# Patient Record
Sex: Male | Born: 1950
Health system: Southern US, Community
[De-identification: ages and names within clinical notes are randomized; demographics above are authoritative.]

## PROBLEM LIST (undated history)

## (undated) DIAGNOSIS — IMO0002 Reserved for concepts with insufficient information to code with codable children: Secondary | ICD-10-CM

## (undated) DIAGNOSIS — M1990 Unspecified osteoarthritis, unspecified site: Secondary | ICD-10-CM

## (undated) DIAGNOSIS — I714 Abdominal aortic aneurysm, without rupture, unspecified: Secondary | ICD-10-CM

## (undated) DIAGNOSIS — E079 Disorder of thyroid, unspecified: Secondary | ICD-10-CM

## (undated) DIAGNOSIS — E785 Hyperlipidemia, unspecified: Secondary | ICD-10-CM

## (undated) DIAGNOSIS — J449 Chronic obstructive pulmonary disease, unspecified: Secondary | ICD-10-CM

## (undated) DIAGNOSIS — B029 Zoster without complications: Secondary | ICD-10-CM

## (undated) DIAGNOSIS — H269 Unspecified cataract: Secondary | ICD-10-CM

## (undated) DIAGNOSIS — C801 Malignant (primary) neoplasm, unspecified: Secondary | ICD-10-CM

## (undated) DIAGNOSIS — F419 Anxiety disorder, unspecified: Secondary | ICD-10-CM

## (undated) DIAGNOSIS — I10 Essential (primary) hypertension: Secondary | ICD-10-CM

## (undated) HISTORY — PX: JOINT REPLACEMENT: SHX530

## (undated) HISTORY — DX: Unspecified cataract: H26.9

## (undated) HISTORY — DX: Abdominal aortic aneurysm, without rupture: I71.4

## (undated) HISTORY — DX: Hyperlipidemia, unspecified: E78.5

## (undated) HISTORY — DX: Essential (primary) hypertension: I10

## (undated) HISTORY — PX: TONSILLECTOMY: SUR1361

## (undated) HISTORY — DX: Anxiety disorder, unspecified: F41.9

## (undated) HISTORY — PX: EYE SURGERY: SHX253

## (undated) HISTORY — PX: CATARACT EXTRACTION: SUR2

## (undated) HISTORY — DX: Malignant (primary) neoplasm, unspecified: C80.1

## (undated) HISTORY — PX: OTHER SURGICAL HISTORY: SHX169

## (undated) HISTORY — DX: Abdominal aortic aneurysm, without rupture, unspecified: I71.40

## (undated) HISTORY — DX: Unspecified osteoarthritis, unspecified site: M19.90

## (undated) HISTORY — DX: Zoster without complications: B02.9

## (undated) HISTORY — DX: Disorder of thyroid, unspecified: E07.9

## (undated) HISTORY — DX: Chronic obstructive pulmonary disease, unspecified: J44.9

## (undated) HISTORY — DX: Reserved for concepts with insufficient information to code with codable children: IMO0002

## (undated) HISTORY — PX: COLONOSCOPY: SHX174

---

## 2001-01-01 ENCOUNTER — Encounter: Payer: Self-pay | Admitting: Internal Medicine

## 2001-01-01 ENCOUNTER — Encounter: Admission: RE | Admit: 2001-01-01 | Discharge: 2001-01-01 | Payer: Self-pay | Admitting: Internal Medicine

## 2002-03-12 ENCOUNTER — Encounter: Payer: Self-pay | Admitting: Physical Medicine and Rehabilitation

## 2002-03-12 ENCOUNTER — Ambulatory Visit (HOSPITAL_COMMUNITY)
Admission: RE | Admit: 2002-03-12 | Discharge: 2002-03-12 | Payer: Self-pay | Admitting: Physical Medicine and Rehabilitation

## 2002-05-30 ENCOUNTER — Encounter: Admission: RE | Admit: 2002-05-30 | Discharge: 2002-05-30 | Payer: Self-pay | Admitting: Internal Medicine

## 2002-05-30 ENCOUNTER — Encounter: Payer: Self-pay | Admitting: Internal Medicine

## 2008-07-28 ENCOUNTER — Emergency Department (HOSPITAL_COMMUNITY): Admission: EM | Admit: 2008-07-28 | Discharge: 2008-07-28 | Payer: Self-pay | Admitting: Emergency Medicine

## 2008-09-29 ENCOUNTER — Ambulatory Visit: Payer: Self-pay | Admitting: Internal Medicine

## 2010-07-27 LAB — DIFFERENTIAL
Basophils Absolute: 0.1 10*3/uL (ref 0.0–0.1)
Basophils Relative: 1 % (ref 0–1)
Monocytes Relative: 6 % (ref 3–12)
Neutro Abs: 4.3 10*3/uL (ref 1.7–7.7)
Neutrophils Relative %: 52 % (ref 43–77)

## 2010-07-27 LAB — CBC
HCT: 36.9 % — ABNORMAL LOW (ref 39.0–52.0)
Hemoglobin: 12.9 g/dL — ABNORMAL LOW (ref 13.0–17.0)
MCHC: 34.9 g/dL (ref 30.0–36.0)
MCV: 88.4 fL (ref 78.0–100.0)
Platelets: 259 K/uL (ref 150–400)
RBC: 4.17 MIL/uL — ABNORMAL LOW (ref 4.22–5.81)
RDW: 12.3 % (ref 11.5–15.5)
WBC: 8.2 K/uL (ref 4.0–10.5)

## 2010-07-27 LAB — HEPATIC FUNCTION PANEL
Bilirubin, Direct: <0.1 mg/dL (ref 0.0–0.3)
Total Bilirubin: 0.6 mg/dL (ref 0.3–1.2)

## 2010-07-27 LAB — POCT CARDIAC MARKERS
CKMB, poc: 1.2 ng/mL (ref 1.0–8.0)
Myoglobin, poc: 59.2 ng/mL (ref 12–200)
Myoglobin, poc: 59.9 ng/mL (ref 12–200)
Troponin i, poc: <0.05 ng/mL (ref 0.00–0.09)

## 2010-07-27 LAB — POCT I-STAT, CHEM 8
Chloride: 103 mEq/L (ref 96–112)
HCT: 37 % — ABNORMAL LOW (ref 39.0–52.0)
Potassium: 3.4 mEq/L — ABNORMAL LOW (ref 3.5–5.1)
Sodium: 140 mEq/L (ref 135–145)

## 2012-07-03 ENCOUNTER — Other Ambulatory Visit: Payer: Self-pay | Admitting: Family Medicine

## 2012-07-15 ENCOUNTER — Telehealth: Payer: Self-pay | Admitting: Family Medicine

## 2012-07-15 MED ORDER — CYCLOBENZAPRINE HCL 10 MG PO TABS: 10.0000 mg | ORAL_TABLET | Freq: Three times a day (TID) | ORAL | Status: DC | PRN

## 2012-07-15 NOTE — Telephone Encounter (Signed)
He can have 30 flexeril, i will e-scribe

## 2012-07-15 NOTE — Telephone Encounter (Signed)
Pt is requesting a refill on this and I see no record of this in his chart

## 2012-07-15 NOTE — Telephone Encounter (Signed)
Patient notified [facility]

## 2012-08-09 ENCOUNTER — Other Ambulatory Visit: Payer: Self-pay | Admitting: Family Medicine

## 2012-08-12 NOTE — Telephone Encounter (Signed)
?   OK to Refill  

## 2012-08-12 NOTE — Telephone Encounter (Signed)
Ok to refill 

## 2012-08-13 ENCOUNTER — Other Ambulatory Visit: Payer: Self-pay | Admitting: Family Medicine

## 2012-08-13 NOTE — Telephone Encounter (Signed)
Medication refilled per protocol. 

## 2012-09-10 ENCOUNTER — Other Ambulatory Visit: Payer: Self-pay | Admitting: Family Medicine

## 2012-09-11 NOTE — Telephone Encounter (Signed)
?   OK to Refill  

## 2012-09-11 NOTE — Telephone Encounter (Signed)
Ok to refill 

## 2012-10-21 ENCOUNTER — Telehealth: Payer: Self-pay | Admitting: Family Medicine

## 2012-10-21 NOTE — Telephone Encounter (Signed)
At beach for [place].  Got Shingles.  [facility] given treatment, famcyclovir 500 tid for 7 days.  On way home found imbedded tick in ear.  Told to call PCP may needs antibiotics??  Also wanted to know about working while still with wet blisters?  Is going to call his work place..they have their own med team for this advise.  Wanted to know if should treat tick bite??

## 2012-10-21 NOTE — Telephone Encounter (Signed)
I would only treat tick bite if he developed flu like symptoms or a spreading red ringed rash.  However, if he wants to err on the side of caution, he could have doxy 100 bid for 10 days.

## 2012-10-21 NOTE — Telephone Encounter (Signed)
Patient called back and explained to him per providers advise.  Has opted not to do antibiotics at this time and will call us if any symptoms mentioned develop

## 2012-10-25 ENCOUNTER — Other Ambulatory Visit: Payer: Self-pay | Admitting: Family Medicine

## 2012-10-25 ENCOUNTER — Other Ambulatory Visit: Payer: 59

## 2012-10-25 DIAGNOSIS — I10 Essential (primary) hypertension: Secondary | ICD-10-CM

## 2012-10-25 DIAGNOSIS — Z0000 Encounter for general adult medical examination without abnormal findings: Secondary | ICD-10-CM

## 2012-10-25 LAB — CBC WITH DIFFERENTIAL/PLATELET
Basophils Absolute: 0.1 10*3/uL (ref 0.0–0.1)
Basophils Relative: 1 % (ref 0–1)
Eosinophils Relative: 6 % — ABNORMAL HIGH (ref 0–5)
Lymphocytes Relative: 34 % (ref 12–46)
MCHC: 35.8 g/dL (ref 30.0–36.0)
MCV: 83.2 fL (ref 78.0–100.0)
Monocytes Absolute: 0.6 10*3/uL (ref 0.1–1.0)
Neutro Abs: 4.1 10*3/uL (ref 1.7–7.7)
Platelets: 297 10*3/uL (ref 150–400)
RDW: 13.5 % (ref 11.5–15.5)
WBC: 7.8 10*3/uL (ref 4.0–10.5)

## 2012-10-25 LAB — LIPID PANEL
LDL Cholesterol: 126 mg/dL — ABNORMAL HIGH (ref 0–99)
Total CHOL/HDL Ratio: 7 Ratio
VLDL: 48 mg/dL — ABNORMAL HIGH (ref 0–40)

## 2012-10-25 LAB — TSH: TSH: 5.365 u[IU]/mL — ABNORMAL HIGH (ref 0.350–4.500)

## 2012-11-05 ENCOUNTER — Encounter: Payer: Self-pay | Admitting: Family Medicine

## 2012-11-05 ENCOUNTER — Ambulatory Visit (INDEPENDENT_AMBULATORY_CARE_PROVIDER_SITE_OTHER): Payer: 59 | Admitting: Family Medicine

## 2012-11-05 VITALS — BP 120/74 | HR 74 | Temp 98.0°F | Resp 16 | Wt 193.0 lb

## 2012-11-05 DIAGNOSIS — I10 Essential (primary) hypertension: Secondary | ICD-10-CM | POA: Insufficient documentation

## 2012-11-05 DIAGNOSIS — Z1211 Encounter for screening for malignant neoplasm of colon: Secondary | ICD-10-CM

## 2012-11-05 DIAGNOSIS — E785 Hyperlipidemia, unspecified: Secondary | ICD-10-CM

## 2012-11-05 DIAGNOSIS — N529 Male erectile dysfunction, unspecified: Secondary | ICD-10-CM

## 2012-11-05 MED ORDER — TADALAFIL 20 MG PO TABS: 10.0000 mg | ORAL_TABLET | ORAL | Status: DC | PRN

## 2012-11-05 MED ORDER — ZOSTER VACCINE LIVE 19400 UNT/0.65ML ~~LOC~~ SOLR: 0.6500 mL | Freq: Once | SUBCUTANEOUS | Status: DC

## 2012-11-05 MED ORDER — PRAVASTATIN SODIUM 20 MG PO TABS: 20.0000 mg | ORAL_TABLET | Freq: Every day | ORAL | Status: DC

## 2012-11-05 NOTE — Progress Notes (Signed)
Subjective:    Patient ID: Keith Pratt, male    DOB: 06/08/1950, 62 y.o.   MRN: [id number]  HPI Patient recently had shingles around his abdomen. This was treated in urgent care. He is now requesting the shingles shot.  I sent the prescription to his pharmacy. He also has hypertension. He is currently on Avalide 300/12.5 one by mouth daily and Norvasc 10 mg by mouth daily. He denies chest pain, shortness of breath, dyspnea on exertion. He is complaining of erectile dysfunction. He states he has a difficult time achieving erections. When he is able to get an erection he quickly will fade. He is requesting medication to help the situation. He also has dyslipidemia. His most recent labs are listed below. He previously has tried Lipitor but stopped the medicine because it made him feel tired..  he also is overdue for colonoscopy.  Past Medical History  Diagnosis Date  . Hyperlipidemia   . Hypertension    Current Outpatient Prescriptions on File Prior to Visit  Medication Sig Dispense Refill  . amLODipine (NORVASC) 10 MG tablet TAKE 1 TABLET BY MOUTH DAILY AS NEEDED FOR BLOOD PRESSURE  30 tablet  5  . irbesartan-hydrochlorothiazide (AVALIDE) 300-12.5 MG per tablet TAKE 1 TABLET BY MOUTH DAILY IN THE MORNING AS NEEDED FOR BLOOD PRESSURE  30 tablet  5   No current facility-administered medications on file prior to visit.   Allergies  Allergen Reactions  . Codeine     REACTION: nauses and vomiting   History   Social History  . Marital Status: Divorced    Spouse Name: N/A    Number of Children: N/A  . Years of Education: N/A   Occupational History  . Not on file.   Social History Main Topics  . Smoking status: Former Games developer  . Smokeless tobacco: Not on file  . Alcohol Use: No  . Drug Use: No  . Sexually Active: Not on file   Other Topics Concern  . Not on file   Social History Narrative  . No narrative on file   Appointment on 10/25/2012  Component Date Value Range  Status  . Cholesterol 10/25/2012 203* 0 - 200 mg/dL Final   Comment: ATP III Classification:                                < 200        mg/dL        Desirable                               200 - 239     mg/dL        Borderline High                               >= 240        mg/dL        High                             . Triglycerides 10/25/2012 242* <150 mg/dL Final  . HDL 40/98/1191 29* >39 mg/dL Final  . Total CHOL/HDL Ratio 10/25/2012 7.0   Final  . VLDL 10/25/2012 48* 0 - 40 mg/dL Final  . LDL Cholesterol 10/25/2012 126* 0 - 99 mg/dL  Final   Comment:                            Total Cholesterol/HDL Ratio:CHD Risk                                                 Coronary Heart Disease Risk Table                                                                 Men       Women                                   1/2 Average Risk              3.4        3.3                                       Average Risk              5.0        4.4                                    2X Average Risk              9.6        7.1                                    3X Average Risk             23.4       11.0                          Use the calculated Patient Ratio above and the CHD Risk table                           to determine the patient's CHD Risk.                          ATP III Classification (LDL):                                < 100        mg/dL         Optimal                               100 - 129     mg/dL         Near or Above Optimal  130 - 159     mg/dL         Borderline High                               160 - 189     mg/dL         High                                > 190        mg/dL         Very High                             . PSA 10/25/2012 2.90  <=4.00 ng/mL Final   Comment: Test Methodology: ECLIA PSA (Electrochemiluminescence Immunoassay)                                                     For PSA values from 2.5-4.0, particularly in younger  men <60 years                          old, the AUA and NCCN suggest testing for % Free PSA (3515) and                          evaluation of the rate of increase in PSA (PSA velocity).  . TSH 10/25/2012 5.365* 0.350 - 4.500 uIU/mL Final  . WBC 10/25/2012 7.8  4.0 - 10.5 K/uL Final  . RBC 10/25/2012 4.64  4.22 - 5.81 MIL/uL Final  . Hemoglobin 10/25/2012 13.8  13.0 - 17.0 g/dL Final  . HCT 81/44/8185 38.6* 39.0 - 52.0 % Final  . MCV 10/25/2012 83.2  78.0 - 100.0 fL Final  . MCH 10/25/2012 29.7  26.0 - 34.0 pg Final  . MCHC 10/25/2012 35.8  30.0 - 36.0 g/dL Final  . RDW 63/14/9702 13.5  11.5 - 15.5 % Final  . Platelets 10/25/2012 297  150 - 400 K/uL Final  . Neutrophils Relative % 10/25/2012 52  43 - 77 % Final  . Neutro Abs 10/25/2012 4.1  1.7 - 7.7 K/uL Final  . Lymphocytes Relative 10/25/2012 34  12 - 46 % Final  . Lymphs Abs 10/25/2012 2.6  0.7 - 4.0 K/uL Final  . Monocytes Relative 10/25/2012 7  3 - 12 % Final  . Monocytes Absolute 10/25/2012 0.6  0.1 - 1.0 K/uL Final  . Eosinophils Relative 10/25/2012 6* 0 - 5 % Final  . Eosinophils Absolute 10/25/2012 0.4  0.0 - 0.7 K/uL Final  . Basophils Relative 10/25/2012 1  0 - 1 % Final  . Basophils Absolute 10/25/2012 0.1  0.0 - 0.1 K/uL Final  . Smear Review 10/25/2012 Criteria for review not met   Final      Review of Systems  All other systems reviewed and are negative.       Objective:   Physical Exam  Vitals reviewed. Constitutional: He is oriented to person, place, and time. He appears well-developed and well-nourished.  Neck: Neck supple. No thyromegaly present.  Cardiovascular: Normal rate, regular rhythm and normal heart sounds.  Exam reveals no gallop and no friction rub.   No murmur heard. Pulmonary/Chest: Effort normal and breath sounds normal. No respiratory distress. He has no wheezes. He has no rales. He exhibits no tenderness.  Abdominal: Soft. Bowel sounds are normal. He exhibits no distension and no mass. There  is no tenderness. There is no rebound and no guarding.  Lymphadenopathy:    He has no cervical adenopathy.  Neurological: He is alert and oriented to person, place, and time. No cranial nerve deficit. He exhibits normal muscle tone. Coordination normal.  Skin: Skin is warm. No rash noted. No erythema. No pallor.          Assessment & Plan:  1. HLD (hyperlipidemia) - pravastatin (PRAVACHOL) 20 MG tablet; Take 1 tablet (20 mg total) by mouth daily.  Dispense: 90 tablet; Refill: 3.  Patient's blood pressure is well controlled. Continue current medications at their present dosages.  2. Dyslipidemia Discussed patient's low HDL and elevated triglycerides. Recommend pravastatin 20 mg by mouth daily. Recheck fasting lipid panel in 3 months.  3. Erectile dysfunction Prescribed Cialis 20 mg tablets. He is to take 1/2-1 tablet by mouth daily when necessary for sexual activity. 4. Screen for colon cancer Refer to GI for colonoscopy. - Ambulatory referral to Gastroenterology

## 2012-11-14 ENCOUNTER — Ambulatory Visit (INDEPENDENT_AMBULATORY_CARE_PROVIDER_SITE_OTHER): Payer: 59 | Admitting: Family Medicine

## 2012-11-14 ENCOUNTER — Encounter: Payer: Self-pay | Admitting: Family Medicine

## 2012-11-14 VITALS — BP 110/68 | HR 64 | Temp 98.1°F | Resp 16 | Wt 194.0 lb

## 2012-11-14 DIAGNOSIS — L723 Sebaceous cyst: Secondary | ICD-10-CM

## 2012-11-14 NOTE — Progress Notes (Signed)
  Subjective:    Patient ID: Keith Pratt, male    DOB: 1950/12/12, 62 y.o.   MRN: [id number]  HPI  Patient has a 1 cm subcutaneous mass on the posterior neck. It has been there for years. He would like it excised. Past Medical History  Diagnosis Date  . Hyperlipidemia   . Hypertension    Current Outpatient Prescriptions on File Prior to Visit  Medication Sig Dispense Refill  . amLODipine (NORVASC) 10 MG tablet TAKE 1 TABLET BY MOUTH DAILY AS NEEDED FOR BLOOD PRESSURE  30 tablet  5  . irbesartan-hydrochlorothiazide (AVALIDE) 300-12.5 MG per tablet TAKE 1 TABLET BY MOUTH DAILY IN THE MORNING AS NEEDED FOR BLOOD PRESSURE  30 tablet  5  . pravastatin (PRAVACHOL) 20 MG tablet Take 1 tablet (20 mg total) by mouth daily.  90 tablet  3  . tadalafil (CIALIS) 20 MG tablet Take 0.5-1 tablets (10-20 mg total) by mouth every other day as needed for erectile dysfunction.  10 tablet  11  . vitamin B-12 (CYANOCOBALAMIN) 500 MCG tablet Take 500 mcg by mouth daily.       No current facility-administered medications on file prior to visit.   Allergies  Allergen Reactions  . Codeine     REACTION: nauses and vomiting   History   Social History  . Marital Status: Divorced    Spouse Name: N/A    Number of Children: N/A  . Years of Education: N/A   Occupational History  . Not on file.   Social History Main Topics  . Smoking status: Former Games developer  . Smokeless tobacco: Not on file  . Alcohol Use: No  . Drug Use: No  . Sexually Active: Not on file   Other Topics Concern  . Not on file   Social History Narrative  . No narrative on file     Review of Systems  All other systems reviewed and are negative.       Objective:   Physical Exam  Vitals reviewed. Cardiovascular: Normal rate and regular rhythm.   Pulmonary/Chest: Effort normal and breath sounds normal.   1 cm sebaceous cyst on the posterior neck.        Assessment & Plan:  1. Sebaceous cyst Informed consent was  obtained. The area was anesthetized with 0.1% lidocaine with epinephrine. It was then prepped and draped in sterile fashion. A 1.5 x 2 cm cutaneous excision was made revealing the underlying dermis. A 1 cm cyst sac was then isolated and dissected from the underlying subcutaneous fascia.  The subcutaneous fascia was then closed with 2 vertical mattress sutures of 3-0 Vicryl.  The skin was then closed with 5 simple interrupted sutures of 3-0 Ethilon.  Wound care was discussed. Blood loss was minimal. Sutures should be removed in 7-10 days

## 2012-11-21 ENCOUNTER — Ambulatory Visit (INDEPENDENT_AMBULATORY_CARE_PROVIDER_SITE_OTHER): Payer: 59 | Admitting: Family Medicine

## 2012-11-21 ENCOUNTER — Encounter: Payer: Self-pay | Admitting: Family Medicine

## 2012-11-21 VITALS — Temp 98.1°F | Wt 190.0 lb

## 2012-11-21 DIAGNOSIS — Z4802 Encounter for removal of sutures: Secondary | ICD-10-CM

## 2012-11-21 NOTE — Progress Notes (Signed)
  Subjective:    Patient ID: Keith Pratt, male    DOB: July 30, 1950, 62 y.o.   MRN: [id number]  HPI  11/14/12 Patient has a 1 cm subcutaneous mass on the posterior neck. It has been there for years. He would like it excised. 1. Sebaceous cyst Informed consent was obtained. The area was anesthetized with 0.1% lidocaine with epinephrine. It was then prepped and draped in sterile fashion. A 1.5 x 2 cm cutaneous excision was made revealing the underlying dermis. A 1 cm cyst sac was then isolated and dissected from the underlying subcutaneous fascia.  The subcutaneous fascia was then closed with 2 vertical mattress sutures of 3-0 Vicryl.  The skin was then closed with 5 simple interrupted sutures of 3-0 Ethilon.  Wound care was discussed. Blood loss was minimal. Sutures should be removed in 7-10 days  He is here today for suture removal. There are no complications. There is no evidence of cellulitis. He denies any pain or swelling. Past Medical History  Diagnosis Date  . Hyperlipidemia   . Hypertension    Current Outpatient Prescriptions on File Prior to Visit  Medication Sig Dispense Refill  . amLODipine (NORVASC) 10 MG tablet TAKE 1 TABLET BY MOUTH DAILY AS NEEDED FOR BLOOD PRESSURE  30 tablet  5  . irbesartan-hydrochlorothiazide (AVALIDE) 300-12.5 MG per tablet TAKE 1 TABLET BY MOUTH DAILY IN THE MORNING AS NEEDED FOR BLOOD PRESSURE  30 tablet  5  . pravastatin (PRAVACHOL) 20 MG tablet Take 1 tablet (20 mg total) by mouth daily.  90 tablet  3  . tadalafil (CIALIS) 20 MG tablet Take 0.5-1 tablets (10-20 mg total) by mouth every other day as needed for erectile dysfunction.  10 tablet  11  . vitamin B-12 (CYANOCOBALAMIN) 500 MCG tablet Take 500 mcg by mouth daily.       No current facility-administered medications on file prior to visit.   Allergies  Allergen Reactions  . Codeine     REACTION: nauses and vomiting   History   Social History  . Marital Status: Divorced    Spouse Name:  N/A    Number of Children: N/A  . Years of Education: N/A   Occupational History  . Not on file.   Social History Main Topics  . Smoking status: Former Games developer  . Smokeless tobacco: Not on file  . Alcohol Use: No  . Drug Use: No  . Sexually Active: Not on file   Other Topics Concern  . Not on file   Social History Narrative  . No narrative on file     Review of Systems  All other systems reviewed and are negative.       Objective:   Physical Exam  Vitals reviewed. Cardiovascular: Normal rate and regular rhythm.   Pulmonary/Chest: Effort normal and breath sounds normal.   well-healed incision on the posterior neck with 5 sutures. No evidence of cellulitis. There is no redness or swelling or pain       Assessment & Plan:  1. Visit for suture removal I removed 5 sutures without complication. The wound was then covered with 3 Steri-Strips. Followup as needed.

## 2013-01-07 ENCOUNTER — Ambulatory Visit (AMBULATORY_SURGERY_CENTER): Payer: Self-pay | Admitting: *Deleted

## 2013-01-07 VITALS — Ht 72.0 in | Wt 193.0 lb

## 2013-01-07 DIAGNOSIS — Z1211 Encounter for screening for malignant neoplasm of colon: Secondary | ICD-10-CM

## 2013-01-07 MED ORDER — PEG-KCL-NACL-NASULF-NA ASC-C 100 G PO SOLR: ORAL | Status: DC

## 2013-01-07 NOTE — Progress Notes (Signed)
No soy or egg allergy 

## 2013-01-08 ENCOUNTER — Encounter: Payer: Self-pay | Admitting: Internal Medicine

## 2013-01-21 ENCOUNTER — Encounter: Payer: Self-pay | Admitting: Internal Medicine

## 2013-01-21 ENCOUNTER — Ambulatory Visit (AMBULATORY_SURGERY_CENTER): Payer: 59 | Admitting: Internal Medicine

## 2013-01-21 VITALS — BP 103/68 | HR 64 | Temp 97.5°F | Resp 18 | Ht 72.0 in | Wt 193.0 lb

## 2013-01-21 DIAGNOSIS — Z1211 Encounter for screening for malignant neoplasm of colon: Secondary | ICD-10-CM

## 2013-01-21 MED ORDER — SODIUM CHLORIDE 0.9 % IV SOLN: 500.0000 mL | INTRAVENOUS | Status: DC

## 2013-01-21 NOTE — Patient Instructions (Addendum)
YOU HAD AN ENDOSCOPIC PROCEDURE TODAY AT THE [facility]: Refer to the procedure report that was given to you for any specific questions about what was found during the examination.  If the procedure report does not answer your questions, please call your gastroenterologist to clarify.  If you requested that your care partner not be given the details of your procedure findings, then the procedure report has been included in a sealed envelope for you to review at your convenience later.  YOU SHOULD EXPECT: Some feelings of bloating in the abdomen. Passage of more gas than usual.  Walking can help get rid of the air that was put into your GI tract during the procedure and reduce the bloating. If you had a lower endoscopy (such as a colonoscopy or flexible sigmoidoscopy) you may notice spotting of blood in your stool or on the toilet paper. If you underwent a bowel prep for your procedure, then you may not have a normal bowel movement for a few days.  DIET: Your first meal following the procedure should be a light meal and then it is ok to progress to your normal diet.  A half-sandwich or bowl of soup is an example of a good first meal.  Heavy or fried foods are harder to digest and may make you feel nauseous or bloated.  Likewise meals heavy in dairy and vegetables can cause extra gas to form and this can also increase the bloating.  Drink plenty of fluids but you should avoid alcoholic beverages for 24 hours.  ACTIVITY: Your care partner should take you home directly after the procedure.  You should plan to take it easy, moving slowly for the rest of the day.  You can resume normal activity the day after the procedure however you should NOT DRIVE or use heavy machinery for 24 hours (because of the sedation medicines used during the test).    SYMPTOMS TO REPORT IMMEDIATELY: A gastroenterologist can be reached at any hour.  During normal business hours, 8:30 AM to 5:00 PM Monday through Friday,  call [phone].  After hours and on weekends, please call the GI answering service at [phone] who will take a message and have the physician on call contact you.   Following lower endoscopy (colonoscopy or flexible sigmoidoscopy):  Excessive amounts of blood in the stool  Significant tenderness or worsening of abdominal pains  Swelling of the abdomen that is new, acute  Fever of 100F or higher  FOLLOW UP:  Our staff will call the home number listed on your records the next business day following your procedure to check on you and address any questions or concerns that you may have at that time regarding the information given to you following your procedure. This is a courtesy call and so if there is no answer at the home number and we have not heard from you through the emergency physician on call, we will assume that you have returned to your regular daily activities without incident.  SIGNATURES/CONFIDENTIALITY: You and/or your care partner have signed paperwork which will be entered into your electronic medical record.  These signatures attest to the fact that that the information above on your After Visit Summary has been reviewed and is understood.  Full responsibility of the confidentiality of this discharge information lies with you and/or your care-partner.  Please continue your normal medicines  Follow up colonoscopy in 10 years

## 2013-01-21 NOTE — Op Note (Signed)
[facility] [address] [place], [address]   COLONOSCOPY PROCEDURE REPORT  PATIENT: Keith, Pratt  MR#: [id number] BIRTHDATE: Oct 09, 1950 , 62  yrs. old GENDER: Male ENDOSCOPIST: Roxy Cedar, MD REFERRED [id number] Recall PROCEDURE DATE:  01/21/2013 PROCEDURE:   Colonoscopy, screening First Screening Colonoscopy - Avg.  risk and is 50 yrs.  old or older - No.  Prior Negative Screening - Now for repeat screening. 10 or more years since last screening  History of Adenoma - Now for follow-up colonoscopy & has been > or = to 3 yrs.  N/A  Polyps Removed Today? No.  Recommend repeat exam, <10 yrs? No. ASA CLASS:   Class II INDICATIONS:average risk screening.   Index 03-2001 (hpp only) MEDICATIONS: MAC sedation, administered by CRNA and propofol (Diprivan) 200mg  IV  DESCRIPTION OF PROCEDURE:   After the risks benefits and alternatives of the procedure were thoroughly explained, informed consent was obtained.  A digital rectal exam revealed no abnormalities of the rectum.   The LB [id number]  endoscope was introduced through the anus and advanced to the cecum, which was identified by both the appendix and ileocecal valve. No adverse events experienced.   The quality of the prep was excellent, using MoviPrep  The instrument was then slowly withdrawn as the colon was fully examined.    COLON FINDINGS: A normal appearing cecum, ileocecal valve, and appendiceal orifice were identified.  The ascending, hepatic flexure, transverse, splenic flexure, descending, sigmoid colon and rectum appeared unremarkable.  No polyps or cancers were seen. Retroflexed views revealed no abnormalities. The time to cecum=3 minutes 02 seconds.  Withdrawal time=9 minutes 45 seconds.  The scope was withdrawn and the procedure completed.  COMPLICATIONS: There were no complications.  ENDOSCOPIC IMPRESSION: 1. Normal colon  RECOMMENDATIONS: 1. Continue current colorectal  screening recommendations for "routine risk" patients with a repeat colonoscopy in 10 years.   eSigned:  Roxy Cedar, MD 01/21/2013 10:28 AM   cc: Georges Mouse, MD and The Patient   PATIENT NAME:  Keith, Pratt MR#: [id number]

## 2013-01-21 NOTE — Progress Notes (Signed)
Lidocaine-40mg IV prior to Propofol InductionPropofol given over incremental dosages 

## 2013-01-21 NOTE — Progress Notes (Signed)
Patient did not experience any of the following events: a burn prior to discharge; a fall within the facility; wrong site/side/patient/procedure/implant event; or a hospital transfer or hospital admission upon discharge from the facility. (G8907) Patient did not have preoperative order for IV antibiotic SSI prophylaxis. (G8918)  

## 2013-01-22 ENCOUNTER — Telehealth: Payer: Self-pay | Admitting: *Deleted

## 2013-01-22 NOTE — Telephone Encounter (Signed)
  Follow up Call-  Call back number 01/21/2013  Post procedure Call Back phone  # [phone]  Permission to leave phone message Yes     Patient questions:  Do you have a fever, pain , or abdominal swelling? no Pain Score  0 *  Have you tolerated food without any problems? yes  Have you been able to return to your normal activities? yes  Do you have any questions about your discharge instructions: Diet   no Medications  no Follow up visit  no  Do you have questions or concerns about your Care? no  Actions: * If pain score is 4 or above: No action needed, pain <4.

## 2013-01-30 ENCOUNTER — Encounter: Payer: Self-pay | Admitting: Family Medicine

## 2013-01-30 ENCOUNTER — Ambulatory Visit (INDEPENDENT_AMBULATORY_CARE_PROVIDER_SITE_OTHER): Payer: 59 | Admitting: Family Medicine

## 2013-01-30 VITALS — BP 110/70 | HR 68 | Temp 97.6°F | Resp 18 | Wt 194.0 lb

## 2013-01-30 DIAGNOSIS — J019 Acute sinusitis, unspecified: Secondary | ICD-10-CM

## 2013-01-30 MED ORDER — FLUTICASONE PROPIONATE 50 MCG/ACT NA SUSP: 2.0000 | Freq: Every day | NASAL | Status: DC

## 2013-01-30 MED ORDER — AMOXICILLIN-POT CLAVULANATE 875-125 MG PO TABS: 1.0000 | ORAL_TABLET | Freq: Two times a day (BID) | ORAL | Status: DC

## 2013-01-30 NOTE — Progress Notes (Signed)
  Subjective:    Patient ID: Keith Pratt, male    DOB: 1951-03-22, 62 y.o.   MRN: [id number]  HPI  Patient is a 62 y/o with 1 week of sinus pressure, pain, and congestion with post nasal drip, productive cough , fever, and sinus headache. Past Medical History  Diagnosis Date  . Hyperlipidemia   . Hypertension   . Arthritis   . Cancer     basal cell CA-under left arm  . Ulcer   . Thyroid disease     as a child- no problem as adult  . Shingles     July 2014   Current Outpatient Prescriptions on File Prior to Visit  Medication Sig Dispense Refill  . amLODipine (NORVASC) 10 MG tablet TAKE 1 TABLET BY MOUTH DAILY AS NEEDED FOR BLOOD PRESSURE  30 tablet  5  . irbesartan-hydrochlorothiazide (AVALIDE) 300-12.5 MG per tablet TAKE 1 TABLET BY MOUTH DAILY IN THE MORNING AS NEEDED FOR BLOOD PRESSURE  30 tablet  5  . pravastatin (PRAVACHOL) 20 MG tablet Take 1 tablet (20 mg total) by mouth daily.  90 tablet  3  . tadalafil (CIALIS) 20 MG tablet Take 0.5-1 tablets (10-20 mg total) by mouth every other day as needed for erectile dysfunction.  10 tablet  11   No current facility-administered medications on file prior to visit.   Allergies  Allergen Reactions  . Codeine     REACTION: nauses and vomiting   History   Social History  . Marital Status: Divorced    Spouse Name: N/A    Number of Children: N/A  . Years of Education: N/A   Occupational History  . Not on file.   Social History Main Topics  . Smoking status: Former Smoker    Quit date: 01/08/2003  . Smokeless tobacco: Never Used  . Alcohol Use: No  . Drug Use: No  . Sexual Activity: Not on file   Other Topics Concern  . Not on file   Social History Narrative  . No narrative on file      Review of Systems  All other systems reviewed and are negative.       Objective:   Physical Exam  Vitals reviewed. HENT:  Right Ear: External ear normal.  Left Ear: External ear normal.  Nose: Mucosal edema,  rhinorrhea and sinus tenderness present. Right sinus exhibits maxillary sinus tenderness and frontal sinus tenderness. Left sinus exhibits maxillary sinus tenderness and frontal sinus tenderness.  Mouth/Throat: Oropharynx is clear and moist. No oropharyngeal exudate.  Eyes: Conjunctivae are normal. Pupils are equal, round, and reactive to light.  Neck: Neck supple. No thyromegaly present.  Cardiovascular: Normal rate, regular rhythm and normal heart sounds.   No murmur heard. Pulmonary/Chest: Effort normal and breath sounds normal. No respiratory distress. He has no wheezes. He has no rales.  Lymphadenopathy:    He has no cervical adenopathy.          Assessment & Plan:  1. Acute rhinosinusitis  - amoxicillin-clavulanate (AUGMENTIN) 875-125 MG per tablet; Take 1 tablet by mouth 2 (two) times daily.  Dispense: 20 tablet; Refill: 0 - fluticasone (FLONASE) 50 MCG/ACT nasal spray; Place 2 sprays into the nose daily.  Dispense: 16 g; Refill: 6

## 2013-02-11 ENCOUNTER — Other Ambulatory Visit: Payer: Self-pay | Admitting: Family Medicine

## 2013-02-11 NOTE — Telephone Encounter (Signed)
Medication refilled per protocol. 

## 2013-04-07 ENCOUNTER — Encounter: Payer: Self-pay | Admitting: Family Medicine

## 2013-04-07 ENCOUNTER — Ambulatory Visit (INDEPENDENT_AMBULATORY_CARE_PROVIDER_SITE_OTHER): Payer: 59 | Admitting: Family Medicine

## 2013-04-07 VITALS — BP 120/72 | HR 68 | Temp 97.0°F | Resp 18 | Ht 74.0 in | Wt 200.0 lb

## 2013-04-07 DIAGNOSIS — J019 Acute sinusitis, unspecified: Secondary | ICD-10-CM

## 2013-04-07 DIAGNOSIS — B9689 Other specified bacterial agents as the cause of diseases classified elsewhere: Secondary | ICD-10-CM

## 2013-04-07 MED ORDER — AZITHROMYCIN 250 MG PO TABS: ORAL_TABLET | ORAL | Status: DC

## 2013-04-07 NOTE — Progress Notes (Signed)
Subjective:    Patient ID: Keith Pratt, male    DOB: [birth year], 62 y.o.   MRN: [id number]  HPI Patient presents today with one month of constant sinus pressure in his maxillary and frontal sinuses. They will not drain. He reports a diffuse headache. Ears feel stopped up due to eustachian tube dysfunction. He is constantly congested with postnasal drip. His upper jaw aches.  He had similar symptoms last year treated effectively with a Z-Pak. Past Medical History  Diagnosis Date  . Hyperlipidemia   . Hypertension   . Arthritis   . Cancer     basal cell CA-under left arm  . Ulcer   . Thyroid disease     as a child- no problem as adult  . Shingles     July 2014   Current Outpatient Prescriptions on File Prior to Visit  Medication Sig Dispense Refill  . amLODipine (NORVASC) 10 MG tablet TAKE 1 TABLET BY MOUTH DAILY AS NEEDED FOR BLOOD PRESSURE  30 tablet  3  . fluticasone (FLONASE) 50 MCG/ACT nasal spray Place 2 sprays into the nose daily.  16 g  6  . irbesartan-hydrochlorothiazide (AVALIDE) 300-12.5 MG per tablet TAKE 1 TABLET BY MOUTH DAILY IN THE MORNING AS NEEDED FOR BLOOD PRESSURE  30 tablet  3  . pravastatin (PRAVACHOL) 20 MG tablet Take 1 tablet (20 mg total) by mouth daily.  90 tablet  3  . tadalafil (CIALIS) 20 MG tablet Take 0.5-1 tablets (10-20 mg total) by mouth every other day as needed for erectile dysfunction.  10 tablet  11   No current facility-administered medications on file prior to visit.   Allergies  Allergen Reactions  . Codeine     REACTION: nauses and vomiting   History   Social History  . Marital Status: Divorced    Spouse Name: N/A    Number of Children: N/A  . Years of Education: N/A   Occupational History  . Not on file.   Social History Main Topics  . Smoking status: Former Smoker    Quit date: 01/08/2003  . Smokeless tobacco: Never Used  . Alcohol Use: No  . Drug Use: No  . Sexual Activity: Not on file   Other Topics Concern  .  Not on file   Social History Narrative  . No narrative on file      Review of Systems  All other systems reviewed and are negative.       Objective:   Physical Exam  Vitals reviewed. Constitutional: He appears well-developed and well-nourished. No distress.  HENT:  Right Ear: Tympanic membrane, external ear and ear canal normal.  Left Ear: Tympanic membrane, external ear and ear canal normal.  Nose: Mucosal edema and rhinorrhea present. Right sinus exhibits maxillary sinus tenderness and frontal sinus tenderness. Left sinus exhibits maxillary sinus tenderness and frontal sinus tenderness.  Mouth/Throat: Oropharynx is clear and moist. No oropharyngeal exudate.  Neck: Neck supple. No JVD present.  Cardiovascular: Normal rate, regular rhythm and normal heart sounds.  Exam reveals no gallop and no friction rub.   No murmur heard. Pulmonary/Chest: Effort normal and breath sounds normal. No respiratory distress. He has no wheezes. He has no rales.  Lymphadenopathy:    He has no cervical adenopathy.  Skin: He is not diaphoretic.          Assessment & Plan:  1. Acute bacterial rhinosinusitis I. the patient a Z-Pak. Also recommended Sudafed 60 mg by mouth every 6 hours  as needed for congestion. Recommended Mucinex for her milligrams by mouth every 6 hours as needed as a mucolytic. Recommended nasal saline 4 times a day. - azithromycin (ZITHROMAX) 250 MG tablet; 2 tabs poqday 1, 1 tab poqday 2-5  Dispense: 6 tablet; Refill: 0

## 2013-06-10 ENCOUNTER — Encounter: Payer: Self-pay | Admitting: Family Medicine

## 2013-06-10 ENCOUNTER — Ambulatory Visit (INDEPENDENT_AMBULATORY_CARE_PROVIDER_SITE_OTHER): Payer: 59 | Admitting: Family Medicine

## 2013-06-10 VITALS — BP 120/64 | HR 86 | Temp 97.1°F | Resp 18 | Ht 74.0 in | Wt 204.0 lb

## 2013-06-10 DIAGNOSIS — K649 Unspecified hemorrhoids: Secondary | ICD-10-CM

## 2013-06-10 DIAGNOSIS — L820 Inflamed seborrheic keratosis: Secondary | ICD-10-CM

## 2013-06-10 MED ORDER — HYDROCORTISONE 2.5 % RE CREA: 1.0000 "application " | TOPICAL_CREAM | Freq: Two times a day (BID) | RECTAL | Status: DC

## 2013-06-10 MED ORDER — MOMETASONE FUROATE 0.1 % EX OINT: TOPICAL_OINTMENT | Freq: Every day | CUTANEOUS | Status: DC

## 2013-06-10 NOTE — Progress Notes (Signed)
Subjective:    Patient ID: Keith Pratt, male    DOB: 12-23-1950, 63 y.o.   MRN: [id number]  HPI Patient is here today for 2 concerns. Problem #1, he has a 7 mm talotibial keratoses on the left side of his nose/left cheek. It is extremely irritating and he would like this removed. Problem #2, he has irritated external hemorrhoids. He has tried Preparation H over-the-counter without benefit. He denies any bleeding. He denies any pain with defecation. He denies any constipation. He denies any melena or hematochezia.  They have been there for approximately one week.   Past Medical History  Diagnosis Date  . Hyperlipidemia   . Hypertension   . Arthritis   . Cancer     basal cell CA-under left arm  . Ulcer   . Thyroid disease     as a child- no problem as adult  . Shingles     July 2014   Current Outpatient Prescriptions on File Prior to Visit  Medication Sig Dispense Refill  . irbesartan-hydrochlorothiazide (AVALIDE) 300-12.5 MG per tablet TAKE 1 TABLET BY MOUTH DAILY IN THE MORNING AS NEEDED FOR BLOOD PRESSURE  30 tablet  3  . pravastatin (PRAVACHOL) 20 MG tablet Take 1 tablet (20 mg total) by mouth daily.  90 tablet  3  . tadalafil (CIALIS) 20 MG tablet Take 0.5-1 tablets (10-20 mg total) by mouth every other day as needed for erectile dysfunction.  10 tablet  11  . amLODipine (NORVASC) 10 MG tablet TAKE 1 TABLET BY MOUTH DAILY AS NEEDED FOR BLOOD PRESSURE  30 tablet  3   No current facility-administered medications on file prior to visit.   Allergies  Allergen Reactions  . Codeine     REACTION: nauses and vomiting   History   Social History  . Marital Status: Divorced    Spouse Name: N/A    Number of Children: N/A  . Years of Education: N/A   Occupational History  . Not on file.   Social History Main Topics  . Smoking status: Former Smoker    Quit date: 01/08/2003  . Smokeless tobacco: Never Used  . Alcohol Use: No  . Drug Use: No  . Sexual Activity: Not on  file   Other Topics Concern  . Not on file   Social History Narrative  . No narrative on file      Review of Systems  All other systems reviewed and are negative.       Objective:   Physical Exam  Vitals reviewed. Constitutional: He appears well-developed and well-nourished.  Cardiovascular: Normal rate and regular rhythm.   Pulmonary/Chest: Effort normal and breath sounds normal.   external hemorrhoid at 11:00 with the patient in a standing position. 4 millimeter irritated seborrheic keratoses on his left cheek.        Assessment & Plan:  1. Hemorrhoids Anusol-HC cream applied twice a day for 10 days. The patient is to call me back if symptoms are no better after 10 days. - hydrocortisone (ANUSOL-HC) 2.5 % rectal cream; Place 1 application rectally 2 (two) times daily.  Dispense: 30 g; Refill: 0  I also gave patient prescription for Elocon ointment to be applied once daily to 8 eczema-like rash on the dorsum of his left foot.  2. SK- the patient requests excision. I warned the patient that this may leave a small scar. I anesthetized the lesion was 0.1% lidocaine with epinephrine. I performed a shave biopsy of the lesion. Hemostasis  was achieved with Drysol. The patient tolerated the procedure well with no complications.

## 2013-06-20 ENCOUNTER — Encounter: Payer: Self-pay | Admitting: Family Medicine

## 2013-06-20 ENCOUNTER — Telehealth: Payer: Self-pay | Admitting: Family Medicine

## 2013-06-20 MED ORDER — IRBESARTAN-HYDROCHLOROTHIAZIDE 300-12.5 MG PO TABS: 1.0000 | ORAL_TABLET | Freq: Every day | ORAL | Status: DC

## 2013-06-20 MED ORDER — AMLODIPINE BESYLATE 10 MG PO TABS: 10.0000 mg | ORAL_TABLET | Freq: Every day | ORAL | Status: DC

## 2013-06-20 NOTE — Telephone Encounter (Signed)
Medication refill for one time only.  Patient needs to be seen.  Letter sent for patient to call and schedule 

## 2013-06-24 ENCOUNTER — Other Ambulatory Visit: Payer: Self-pay | Admitting: Family Medicine

## 2013-06-24 MED ORDER — AMLODIPINE BESYLATE 10 MG PO TABS: 10.0000 mg | ORAL_TABLET | Freq: Every day | ORAL | Status: DC

## 2013-06-24 MED ORDER — IRBESARTAN-HYDROCHLOROTHIAZIDE 300-12.5 MG PO TABS: 1.0000 | ORAL_TABLET | Freq: Every day | ORAL | Status: DC

## 2013-06-24 NOTE — Telephone Encounter (Signed)
Rx Refilled  

## 2013-07-14 ENCOUNTER — Ambulatory Visit (INDEPENDENT_AMBULATORY_CARE_PROVIDER_SITE_OTHER): Payer: 59 | Admitting: Family Medicine

## 2013-07-14 ENCOUNTER — Encounter: Payer: Self-pay | Admitting: Family Medicine

## 2013-07-14 VITALS — BP 108/62 | HR 68 | Temp 97.4°F | Resp 18 | Ht 74.0 in | Wt 195.0 lb

## 2013-07-14 DIAGNOSIS — E785 Hyperlipidemia, unspecified: Secondary | ICD-10-CM

## 2013-07-14 DIAGNOSIS — J310 Chronic rhinitis: Secondary | ICD-10-CM

## 2013-07-14 DIAGNOSIS — I10 Essential (primary) hypertension: Secondary | ICD-10-CM

## 2013-07-14 DIAGNOSIS — J329 Chronic sinusitis, unspecified: Secondary | ICD-10-CM

## 2013-07-14 MED ORDER — CEFDINIR 300 MG PO CAPS: 300.0000 mg | ORAL_CAPSULE | Freq: Two times a day (BID) | ORAL | Status: DC

## 2013-07-14 MED ORDER — PREDNISONE 20 MG PO TABS: ORAL_TABLET | ORAL | Status: DC

## 2013-07-14 NOTE — Progress Notes (Signed)
Subjective:    Patient ID: Keith Pratt, male    DOB: 1950/10/06, 63 y.o.   MRN: [id number]  HPI Patient is here today for hypertension.  Patient started taking Norvasc 10 mg by mouth daily and Avalide 300/12.5 one by mouth daily. He denies any chest pain shortness of breath or dyspnea on exertion. He is also taking pravastatin 20 mg by mouth daily. He denies any mouth or right quadrant pain. He returns fasting for a fasting lipid panel. He is a chronic sinus trouble for the last several months. He is tried Flonase, nasal saline, and Sudafed without relief. He's even tried Z-Pak without relief. He reports pressure and constant postnasal drip. Past Medical History  Diagnosis Date  . Hyperlipidemia   . Hypertension   . Arthritis   . Cancer     basal cell CA-under left arm  . Ulcer   . Thyroid disease     as a child- no problem as adult  . Shingles     July 2014   Current Outpatient Prescriptions on File Prior to Visit  Medication Sig Dispense Refill  . amLODipine (NORVASC) 10 MG tablet Take 1 tablet (10 mg total) by mouth daily.  30 tablet  5  . irbesartan-hydrochlorothiazide (AVALIDE) 300-12.5 MG per tablet Take 1 tablet by mouth daily.  30 tablet  5  . pravastatin (PRAVACHOL) 20 MG tablet Take 1 tablet (20 mg total) by mouth daily.  90 tablet  3  . tadalafil (CIALIS) 20 MG tablet Take 0.5-1 tablets (10-20 mg total) by mouth every other day as needed for erectile dysfunction.  10 tablet  11   No current facility-administered medications on file prior to visit.   Allergies  Allergen Reactions  . Codeine     REACTION: nauses and vomiting   History   Social History  . Marital Status: Divorced    Spouse Name: N/A    Number of Children: N/A  . Years of Education: N/A   Occupational History  . Not on file.   Social History Main Topics  . Smoking status: Former Smoker    Quit date: 01/08/2003  . Smokeless tobacco: Never Used  . Alcohol Use: No  . Drug Use: No  .  Sexual Activity: Not on file   Other Topics Concern  . Not on file   Social History Narrative  . No narrative on file      Review of Systems  All other systems reviewed and are negative.       Objective:   Physical Exam  Vitals reviewed. HENT:  Nose: Mucosal edema and rhinorrhea present. Right sinus exhibits no maxillary sinus tenderness and no frontal sinus tenderness. Left sinus exhibits no maxillary sinus tenderness and no frontal sinus tenderness.  Neck: Neck supple. No thyromegaly present.  Cardiovascular: Normal rate, regular rhythm and normal heart sounds.   No murmur heard. Pulmonary/Chest: Effort normal and breath sounds normal. No respiratory distress. He has no wheezes. He has no rales.  Abdominal: Soft. Bowel sounds are normal. He exhibits no distension. There is no tenderness. There is no rebound.          Assessment & Plan:  1. Chronic rhinosinusitis Begin Omnicef 300 mg by mouth twice a day for 10 days and prednisone taper pack. If symptoms do not improve I recommend an ENT consult. - cefdinir (OMNICEF) 300 MG capsule; Take 1 capsule (300 mg total) by mouth 2 (two) times daily.  Dispense: 20 capsule; Refill: 0 -  predniSONE (DELTASONE) 20 MG tablet; 3 tabs poqday 1-2, 2 tabs poqday 3-4, 1 tab poqday 5-6  Dispense: 12 tablet; Refill: 0  2. HTN (hypertension) Pressures well controlled. Continue current medications at the present dosages. - COMPLETE METABOLIC PANEL WITH GFR; Future  3. HLD (hyperlipidemia) Return fasting for a fasting lipid panel. Of is less than 130. - Lipid panel; Future  Patient also has a history of erectile dysfunction. I gave him a sample pack of Cialis 5 mg by mouth daily. He will try one month of this. If this does not work, I gave the patient sample pack of Cialis 20 mg tablets to use one half to one tablet every day as needed for sex acitivity. Whichever method works better, I will prescribe long term for his ED.

## 2013-07-16 ENCOUNTER — Other Ambulatory Visit: Payer: 59

## 2013-07-16 DIAGNOSIS — I10 Essential (primary) hypertension: Secondary | ICD-10-CM

## 2013-07-16 DIAGNOSIS — E785 Hyperlipidemia, unspecified: Secondary | ICD-10-CM

## 2013-07-17 ENCOUNTER — Other Ambulatory Visit: Payer: Self-pay | Admitting: *Deleted

## 2013-07-17 LAB — COMPLETE METABOLIC PANEL WITH GFR
ALBUMIN: 4.5 g/dL (ref 3.5–5.2)
ALK PHOS: 67 U/L (ref 39–117)
ALT: 24 U/L (ref 0–53)
AST: 25 U/L (ref 0–37)
BUN: 12 mg/dL (ref 6–23)
CO2: 24 mEq/L (ref 19–32)
Calcium: 9.3 mg/dL (ref 8.4–10.5)
Chloride: 96 mEq/L (ref 96–112)
Creat: 0.79 mg/dL (ref 0.50–1.35)
GFR, Est African American: >89 mL/min
GLUCOSE: 100 mg/dL — AB (ref 70–99)
POTASSIUM: 3.7 meq/L (ref 3.5–5.3)
SODIUM: 132 meq/L — AB (ref 135–145)
Total Bilirubin: 0.7 mg/dL (ref 0.2–1.2)
Total Protein: 7.1 g/dL (ref 6.0–8.3)

## 2013-07-17 LAB — LIPID PANEL
CHOL/HDL RATIO: 3.4 ratio
Cholesterol: 150 mg/dL (ref 0–200)
HDL: 44 mg/dL (ref >39)
LDL Cholesterol: 88 mg/dL (ref 0–99)
Triglycerides: 92 mg/dL (ref <150)
VLDL: 18 mg/dL (ref 0–40)

## 2013-07-17 MED ORDER — AMLODIPINE BESYLATE 10 MG PO TABS: 10.0000 mg | ORAL_TABLET | Freq: Every day | ORAL | Status: DC

## 2013-07-17 MED ORDER — IRBESARTAN-HYDROCHLOROTHIAZIDE 300-12.5 MG PO TABS: 1.0000 | ORAL_TABLET | Freq: Every day | ORAL | Status: DC

## 2013-07-17 NOTE — Telephone Encounter (Signed)
Refill appropriate and filled per protocol. 

## 2013-07-30 ENCOUNTER — Other Ambulatory Visit: Payer: Self-pay | Admitting: Family Medicine

## 2013-07-31 ENCOUNTER — Other Ambulatory Visit: Payer: Self-pay | Admitting: Family Medicine

## 2013-10-08 ENCOUNTER — Encounter: Payer: Self-pay | Admitting: Physician Assistant

## 2013-10-08 ENCOUNTER — Ambulatory Visit (INDEPENDENT_AMBULATORY_CARE_PROVIDER_SITE_OTHER): Payer: 59 | Admitting: Physician Assistant

## 2013-10-08 VITALS — BP 106/60 | HR 64 | Temp 97.0°F | Resp 18 | Wt 190.0 lb

## 2013-10-08 DIAGNOSIS — M25569 Pain in unspecified knee: Secondary | ICD-10-CM

## 2013-10-08 DIAGNOSIS — M25561 Pain in right knee: Secondary | ICD-10-CM

## 2013-10-08 MED ORDER — MELOXICAM 7.5 MG PO TABS: 7.5000 mg | ORAL_TABLET | Freq: Every day | ORAL | Status: DC

## 2013-10-08 NOTE — Progress Notes (Signed)
Patient ID: Keith Pratt MRN: [id number], DOB: [birth year], 63 y.o. Date of Encounter: 10/08/2013, 4:10 PM    Chief Complaint:  Chief Complaint  Patient presents with  . rt knee pain/swelling     HPI: 63 y.o. year old white male says his right knee has been aching for about 2 years. Says for about 2 months now, there has been pain in the front and the back of then knee.  says that he has pain regardless of the position. Causing him to limp for the past 2 months. Says it has been swelling for quite some time now.  Says he still works. 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 Says this includes a lot of crawling around. Says he has to crawl up on top of the roof, crawls inside the furnace etc. Says that Dr. 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  States that he has had no recent acute injury or trauma. The above symptoms and pain have all come on gradually over months and years.  Says Dr. Onnie Graham did surgery on his shoulder and he wants to see Dr. supple.   Home Meds:   Outpatient Prescriptions Prior to Visit  Medication Sig Dispense Refill  . amLODipine (NORVASC) 10 MG tablet Take 1 tablet (10 mg total) by mouth daily.  30 tablet  5  . irbesartan-hydrochlorothiazide (AVALIDE) 300-12.5 MG per tablet Take 1 tablet by mouth daily.  30 tablet  5  . pravastatin (PRAVACHOL) 20 MG tablet TAKE 1 TABLET (20 MG TOTAL) BY MOUTH DAILY.  90 tablet  4  . tadalafil (CIALIS) 20 MG tablet Take 0.5-1 tablets (10-20 mg total) by mouth every other day as needed for erectile dysfunction.  10 tablet  11  . amLODipine (NORVASC) 10 MG tablet TAKE 1 TABLET BY MOUTH DAILY AS NEEDED FOR BLOOD PRESSURE  30 tablet  5  . cefdinir (OMNICEF) 300 MG capsule Take 1 capsule (300 mg total) by mouth 2 (two) times daily.  20 capsule  0  . irbesartan-hydrochlorothiazide (AVALIDE) 300-12.5 MG per tablet TAKE 1 TABLET BY MOUTH DAILY IN THE MORNING AS  NEEDED FOR BLOOD PRESSURE  30 tablet  5  . predniSONE (DELTASONE) 20 MG tablet 3 tabs poqday 1-2, 2 tabs poqday 3-4, 1 tab poqday 5-6  12 tablet  0   No facility-administered medications prior to visit.    Allergies:  Allergies  Allergen Reactions  . Codeine     REACTION: nauses and vomiting      Review of Systems: See HPI for pertinent ROS. All other ROS negative.    Physical Exam: Blood pressure 106/60, pulse 64, temperature 97 F (36.1 C), temperature source Oral, resp. rate 18, weight 190 lb (86.183 kg)., Body mass index is 24.38 kg/(m^2). General: WNWD WM.  Appears in no acute distress. Neck: Supple. No thyromegaly. No lymphadenopathy. Lungs: Clear bilaterally to auscultation without wheezes, rales, or rhonchi. Breathing is unlabored. Heart: Regular rhythm. No murmurs, rubs, or gallops. Msk:  Strength and tone normal for age. Extremities/Skin: Warm and dry. Right Knee: Inspection: There is diffuse swelling across the entire knee.  There is increased amount of swelling/effusion  here to the knee joint at the medial aspect.  There is tenderness at the medial joint line as well as some at the lateral joint line.  Neuro: Alert and oriented X 3. Moves all extremities spontaneously. Gait is normal. CNII-XII grossly in tact. Psych:  Responds to questions appropriately with a normal affect.     ASSESSMENT AND PLAN:  63 y.o. year old male with  1. Right knee pain Will refer him to Dr. Onnie Graham. In the interim, he can use Modic. Have told him to take this with food. He can take twice daily as needed. Told him not to use any other type of NSAIDS while using the Mobic. He recently had lab work with normal renal function. - Ambulatory referral to Orthopedic Surgery - meloxicam (MOBIC) 7.5 MG tablet; Take 1 tablet (7.5 mg total) by mouth daily.  Dispense: 30 tablet; Refill: 1   Signed, [address] [place], [place], [facility] 10/08/2013 4:10 PM

## 2013-12-15 ENCOUNTER — Encounter: Payer: Self-pay | Admitting: Internal Medicine

## 2013-12-18 ENCOUNTER — Encounter: Payer: Self-pay | Admitting: Family Medicine

## 2013-12-18 ENCOUNTER — Ambulatory Visit (INDEPENDENT_AMBULATORY_CARE_PROVIDER_SITE_OTHER): Payer: 59 | Admitting: Family Medicine

## 2013-12-18 VITALS — BP 132/76 | HR 78 | Temp 98.1°F | Resp 18 | Ht 72.0 in | Wt 191.0 lb

## 2013-12-18 DIAGNOSIS — N400 Enlarged prostate without lower urinary tract symptoms: Secondary | ICD-10-CM

## 2013-12-18 DIAGNOSIS — L821 Other seborrheic keratosis: Secondary | ICD-10-CM

## 2013-12-18 MED ORDER — TADALAFIL 20 MG PO TABS: 10.0000 mg | ORAL_TABLET | ORAL | Status: DC | PRN

## 2013-12-18 NOTE — Progress Notes (Signed)
   Subjective:    Patient ID: Keith Pratt, male    DOB: 02-26-1951, 63 y.o.   MRN: [id number]  HPI  patient has 4 lesions he would like removed.  He is a keratosis on his upper medial left thigh. He is keratosis on his lower left lateral abdomen. He has a skin tag right above his gluteal cleft. He also has a skin tag on his lower back. This lesion is 5 mm in size. This lesion is benign. He was like these removed today. He also reports decreased urinary flow, increased hesitancy, increasing nocturia, and weak urinary stream. He is due for a PSA. Past Medical History  Diagnosis Date  . Hyperlipidemia   . Hypertension   . Arthritis   . Cancer     basal cell CA-under left arm  . Ulcer   . Thyroid disease     as a child- no problem as adult  . Shingles     July 2014   Current Outpatient Prescriptions on File Prior to Visit  Medication Sig Dispense Refill  . amLODipine (NORVASC) 10 MG tablet Take 1 tablet (10 mg total) by mouth daily.  30 tablet  5  . irbesartan-hydrochlorothiazide (AVALIDE) 300-12.5 MG per tablet Take 1 tablet by mouth daily.  30 tablet  5  . pravastatin (PRAVACHOL) 20 MG tablet TAKE 1 TABLET (20 MG TOTAL) BY MOUTH DAILY.  90 tablet  4  . tadalafil (CIALIS) 20 MG tablet Take 0.5-1 tablets (10-20 mg total) by mouth every other day as needed for erectile dysfunction.  10 tablet  11   No current facility-administered medications on file prior to visit.   Allergies  Allergen Reactions  . Codeine     REACTION: nauses and vomiting   History   Social History  . Marital Status: Divorced    Spouse Name: N/A    Number of Children: N/A  . Years of Education: N/A   Occupational History  . Not on file.   Social History Main Topics  . Smoking status: Former Smoker    Quit date: 01/08/2003  . Smokeless tobacco: Never Used  . Alcohol Use: No  . Drug Use: No  . Sexual Activity: Not on file   Other Topics Concern  . Not on file   Social History Narrative  . No  narrative on file      Review of Systems  All other systems reviewed and are negative.      Objective:   Physical Exam  Vitals reviewed. Cardiovascular: Normal rate and regular rhythm.   Pulmonary/Chest: Effort normal and breath sounds normal.   skin lesions as described in history of present illness.  Patient has a +2 prostate without nodularity on rectal exam        Assessment & Plan:  BPH (benign prostatic hyperplasia) - Plan: PSA  Seborrheic keratoses   All 4  lesions were anesthetized with 0.1% lidocaine with epinephrine. Each lesion was then removed using a shave biopsy technique using sterile technique. Hemostasis was achieved with Drysol. Tolerated the procedure well without complication.  Patient has BPH today on examination. I will check a PSA.

## 2013-12-19 ENCOUNTER — Encounter: Payer: Self-pay | Admitting: *Deleted

## 2013-12-19 LAB — PSA: PSA: 2.44 ng/mL (ref <=4.00)

## 2014-02-09 ENCOUNTER — Telehealth: Payer: Self-pay | Admitting: Family Medicine

## 2014-02-09 DIAGNOSIS — H9313 Tinnitus, bilateral: Secondary | ICD-10-CM

## 2014-02-09 NOTE — Telephone Encounter (Signed)
Patient is calling to see if he can get referral to [facility] to see dr bates if possible for ringing in his ears [phone]

## 2014-02-10 NOTE — Telephone Encounter (Signed)
ok 

## 2014-02-10 NOTE — Telephone Encounter (Signed)
Ok to do referral?  

## 2014-02-10 NOTE — Telephone Encounter (Signed)
Referral placed for ENT

## 2014-02-26 ENCOUNTER — Telehealth: Payer: Self-pay | Admitting: Family Medicine

## 2014-02-26 NOTE — Telephone Encounter (Signed)
Ok to switch to viagra 100 mg poqday prn (5) rf =11

## 2014-02-26 NOTE — Telephone Encounter (Signed)
Patient is calling to see if he can change his Cialis to viagra if possible, because the Cialis is too expensive   [phone]

## 2014-02-26 NOTE — Telephone Encounter (Signed)
OK to do-

## 2014-02-27 MED ORDER — SILDENAFIL CITRATE 100 MG PO TABS: 50.0000 mg | ORAL_TABLET | Freq: Every day | ORAL | Status: DC | PRN

## 2014-02-27 NOTE — Telephone Encounter (Signed)
Med sent to pharm 

## 2014-06-26 ENCOUNTER — Telehealth: Payer: Self-pay | Admitting: *Deleted

## 2014-06-26 NOTE — Telephone Encounter (Signed)
[facility] called and OK given for Zoster vaccine

## 2014-06-26 NOTE — Telephone Encounter (Signed)
ok 

## 2014-06-26 NOTE — Telephone Encounter (Signed)
Received call from [facility]. [phone].  Requested order for Shingles Vaccine to be sent to [facility] to order?

## 2014-07-06 ENCOUNTER — Other Ambulatory Visit: Payer: Self-pay | Admitting: Family Medicine

## 2014-07-27 ENCOUNTER — Encounter: Payer: Self-pay | Admitting: Family Medicine

## 2014-07-27 ENCOUNTER — Ambulatory Visit (INDEPENDENT_AMBULATORY_CARE_PROVIDER_SITE_OTHER): Payer: 59 | Admitting: Family Medicine

## 2014-07-27 VITALS — BP 136/68 | HR 68 | Temp 97.7°F | Resp 18 | Ht 74.0 in | Wt 199.0 lb

## 2014-07-27 DIAGNOSIS — J329 Chronic sinusitis, unspecified: Secondary | ICD-10-CM

## 2014-07-27 MED ORDER — FLUTICASONE PROPIONATE 50 MCG/ACT NA SUSP: 2.0000 | Freq: Every day | NASAL | Status: DC

## 2014-07-27 MED ORDER — AMOXICILLIN-POT CLAVULANATE 875-125 MG PO TABS: 1.0000 | ORAL_TABLET | Freq: Two times a day (BID) | ORAL | Status: DC

## 2014-07-27 MED ORDER — TADALAFIL 5 MG PO TABS: 5.0000 mg | ORAL_TABLET | Freq: Every day | ORAL | Status: DC

## 2014-07-27 NOTE — Progress Notes (Signed)
Subjective:    Patient ID: Keith Pratt, male    DOB: 1950-04-21, 64 y.o.   MRN: [id number]  HPI  Patient has had sinus congestion, sinus pressure, and sinus pain for more than a week. He has had approximately 10 days. It hurts behind his eyes and in his cheeks. He is having postnasal drip and a sore scratchy throat due to that. He denies any fevers today.  He is also requesting to switch to daily Cialis for a combination of BPH as well as erectile dysfunction.  Past Medical History  Diagnosis Date  . Hyperlipidemia   . Hypertension   . Arthritis   . Cancer     basal cell CA-under left arm  . Ulcer   . Thyroid disease     as a child- no problem as adult  . Shingles     July 2014   Past Surgical History  Procedure Laterality Date  . Cataract extraction      right eye  . Right arm surgery    . Colonoscopy    . Tonsillectomy     Current Outpatient Prescriptions on File Prior to Visit  Medication Sig Dispense Refill  . amLODipine (NORVASC) 10 MG tablet Take 1 tablet (10 mg total) by mouth daily. 30 tablet 5  . irbesartan-hydrochlorothiazide (AVALIDE) 300-12.5 MG per tablet Take 1 tablet by mouth daily. 30 tablet 5  . pravastatin (PRAVACHOL) 20 MG tablet TAKE 1 TABLET (20 MG TOTAL) BY MOUTH DAILY. 90 tablet 4  . sildenafil (VIAGRA) 100 MG tablet Take 0.5-1 tablets (50-100 mg total) by mouth daily as needed for erectile dysfunction. 5 tablet 11   No current facility-administered medications on file prior to visit.   Allergies  Allergen Reactions  . Codeine     REACTION: nauses and vomiting   History   Social History  . Marital Status: Divorced    Spouse Name: N/A  . Number of Children: N/A  . Years of Education: N/A   Occupational History  . Not on file.   Social History Main Topics  . Smoking status: Former Smoker    Quit date: 01/08/2003  . Smokeless tobacco: Never Used  . Alcohol Use: No  . Drug Use: No  . Sexual Activity: Not on file   Other Topics  Concern  . Not on file   Social History Narrative     Review of Systems  All other systems reviewed and are negative.      Objective:   Physical Exam  Constitutional: He appears well-developed and well-nourished. No distress.  HENT:  Right Ear: Tympanic membrane, external ear and ear canal normal.  Left Ear: Tympanic membrane, external ear and ear canal normal.  Nose: Mucosal edema and rhinorrhea present. Right sinus exhibits maxillary sinus tenderness and frontal sinus tenderness. Left sinus exhibits maxillary sinus tenderness and frontal sinus tenderness.  Mouth/Throat: Oropharynx is clear and moist. No oropharyngeal exudate.  Neck: Neck supple.  Cardiovascular: Normal rate, regular rhythm and normal heart sounds.   Pulmonary/Chest: Effort normal and breath sounds normal. No respiratory distress. He has no wheezes. He has no rales.  Lymphadenopathy:    He has no cervical adenopathy.  Skin: He is not diaphoretic.  Vitals reviewed.         Assessment & Plan:  Rhinosinusitis - Plan: amoxicillin-clavulanate (AUGMENTIN) 875-125 MG per tablet, fluticasone (FLONASE) 50 MCG/ACT nasal spray  Begin Augmentin 875 mg by mouth twice a day for 10 days. I believe the patient's sinusitis  was initially caused by allergies. However I now believe he has a full-blown sinus infection. I will treat this with Augmentin. I will also add Flonase 2 sprays each nostril daily to help treat allergies but I believe triggered it. If symptoms worsen he is to recheck with me in a week. I will also switch the patient to Cialis 5 mg a day and discontinue Viagra

## 2014-08-02 ENCOUNTER — Other Ambulatory Visit: Payer: Self-pay | Admitting: Family Medicine

## 2014-08-06 ENCOUNTER — Other Ambulatory Visit: Payer: Self-pay | Admitting: Family Medicine

## 2014-09-18 ENCOUNTER — Ambulatory Visit (INDEPENDENT_AMBULATORY_CARE_PROVIDER_SITE_OTHER): Payer: 59 | Admitting: Family Medicine

## 2014-09-18 ENCOUNTER — Encounter: Payer: Self-pay | Admitting: Family Medicine

## 2014-09-18 VITALS — BP 120/78 | HR 82 | Temp 97.8°F | Resp 18 | Wt 196.0 lb

## 2014-09-18 DIAGNOSIS — L03115 Cellulitis of right lower limb: Secondary | ICD-10-CM | POA: Diagnosis not present

## 2014-09-18 MED ORDER — DOXYCYCLINE HYCLATE 100 MG PO TABS: 100.0000 mg | ORAL_TABLET | Freq: Two times a day (BID) | ORAL | Status: DC

## 2014-09-18 NOTE — Progress Notes (Signed)
   Subjective:    Patient ID: Keith Pratt, male    DOB: 09-18-1950, 64 y.o.   MRN: [id number]  HPI  she was bitten by a tick 5 days ago. The area now has become erythematous tender sore and painful. Is approximately the size of silver dollar. It seems to be spreading. Past Medical History  Diagnosis Date  . Hyperlipidemia   . Hypertension   . Arthritis   . Cancer     basal cell CA-under left arm  . Ulcer   . Thyroid disease     as a child- no problem as adult  . Shingles     July 2014   Past Surgical History  Procedure Laterality Date  . Cataract extraction      right eye  . Right arm surgery    . Colonoscopy    . Tonsillectomy     Current Outpatient Prescriptions on File Prior to Visit  Medication Sig Dispense Refill  . amLODipine (NORVASC) 10 MG tablet Take 1 tablet (10 mg total) by mouth daily. 30 tablet 5  . fluticasone (FLONASE) 50 MCG/ACT nasal spray Place 2 sprays into both nostrils daily. 16 g 6  . irbesartan-hydrochlorothiazide (AVALIDE) 300-12.5 MG per tablet TAKE 1 TABLET BY MOUTH DAILY IN THE MORNING AS NEEDED FOR BLOOD PRESSURE 30 tablet 5  . pravastatin (PRAVACHOL) 20 MG tablet TAKE 1 TABLET EVERY DAY 90 tablet 2  . sildenafil (VIAGRA) 100 MG tablet Take 0.5-1 tablets (50-100 mg total) by mouth daily as needed for erectile dysfunction. 5 tablet 11   No current facility-administered medications on file prior to visit.   Allergies  Allergen Reactions  . Codeine     REACTION: nauses and vomiting   History   Social History  . Marital Status: Divorced    Spouse Name: N/A  . Number of Children: N/A  . Years of Education: N/A   Occupational History  . Not on file.   Social History Main Topics  . Smoking status: Former Smoker    Quit date: 01/08/2003  . Smokeless tobacco: Never Used  . Alcohol Use: No  . Drug Use: No  . Sexual Activity: Not on file   Other Topics Concern  . Not on file   Social History Narrative      Review of Systems    All other systems reviewed and are negative.      Objective:   Physical Exam  Cardiovascular: Normal rate and normal heart sounds.   Pulmonary/Chest: Effort normal and breath sounds normal.  Skin: Rash noted. There is erythema.  Vitals reviewed.         Assessment & Plan:  Cellulitis of leg, right - Plan: doxycycline (VIBRA-TABS) 100 MG tablet   Cover the patient with cellulitis and possible tickborne illness with doxycycline 100 mg by mouth twice a day for 10 days.

## 2014-10-06 ENCOUNTER — Ambulatory Visit (INDEPENDENT_AMBULATORY_CARE_PROVIDER_SITE_OTHER): Payer: 59 | Admitting: Family Medicine

## 2014-10-06 ENCOUNTER — Encounter: Payer: Self-pay | Admitting: Family Medicine

## 2014-10-06 VITALS — BP 128/74 | HR 84 | Temp 98.0°F | Resp 18 | Ht 74.0 in | Wt 197.0 lb

## 2014-10-06 DIAGNOSIS — R609 Edema, unspecified: Secondary | ICD-10-CM | POA: Diagnosis not present

## 2014-10-06 NOTE — Progress Notes (Signed)
Subjective:    Patient ID: Keith Pratt, male    DOB: April 01, 1951, 64 y.o.   MRN: [id number]  HPI Patient is a very pleasant 64 year old white male who presents today with leg swelling. He has +1 pitting edema from his knees to his ankles. Patient states this is been going on several months however it has recently gotten worse. Since September 2015, the patient has gained 6 pounds. He denies any chest pain. He denies any orthopnea. He denies any paroxysmal nocturnal dyspnea. He denies any palpitations or syncope. Of note the patient is on amlodipine. Past Medical History  Diagnosis Date  . Hyperlipidemia   . Hypertension   . Arthritis   . Cancer     basal cell CA-under left arm  . Ulcer   . Thyroid disease     as a child- no problem as adult  . Shingles     July 2014   Past Surgical History  Procedure Laterality Date  . Cataract extraction      right eye  . Right arm surgery    . Colonoscopy    . Tonsillectomy     Current Outpatient Prescriptions on File Prior to Visit  Medication Sig Dispense Refill  . amLODipine (NORVASC) 10 MG tablet Take 1 tablet (10 mg total) by mouth daily. 30 tablet 5  . fluticasone (FLONASE) 50 MCG/ACT nasal spray Place 2 sprays into both nostrils daily. 16 g 6  . irbesartan-hydrochlorothiazide (AVALIDE) 300-12.5 MG per tablet TAKE 1 TABLET BY MOUTH DAILY IN THE MORNING AS NEEDED FOR BLOOD PRESSURE 30 tablet 5  . pravastatin (PRAVACHOL) 20 MG tablet TAKE 1 TABLET EVERY DAY 90 tablet 2  . sildenafil (VIAGRA) 100 MG tablet Take 0.5-1 tablets (50-100 mg total) by mouth daily as needed for erectile dysfunction. 5 tablet 11   No current facility-administered medications on file prior to visit.   Allergies  Allergen Reactions  . Codeine     REACTION: nauses and vomiting   History   Social History  . Marital Status: Divorced    Spouse Name: N/A  . Number of Children: N/A  . Years of Education: N/A   Occupational History  . Not on file.    Social History Main Topics  . Smoking status: Former Smoker    Quit date: 01/08/2003  . Smokeless tobacco: Never Used  . Alcohol Use: No  . Drug Use: No  . Sexual Activity: Not on file   Other Topics Concern  . Not on file   Social History Narrative      Review of Systems  All other systems reviewed and are negative.      Objective:   Physical Exam  Constitutional: He appears well-developed and well-nourished.  Neck: No JVD present.  Cardiovascular: Normal rate, regular rhythm, normal heart sounds and intact distal pulses.   No murmur heard. Pulmonary/Chest: Effort normal and breath sounds normal. No respiratory distress. He has no wheezes. He has no rales. He exhibits no tenderness.  Musculoskeletal: He exhibits edema.  Vitals reviewed.         Assessment & Plan:  Edema - Plan: COMPLETE METABOLIC PANEL WITH GFR, CBC with Differential/Platelet  I suspect that the edema in the patient's legs is due to vasodilatation from amlodipine. I have asked the patient to discontinue amlodipine. I will replace the amlodipine with by systolic 5 mg a day. I have asked the patient to call back next week with an update. If the swelling has resolved, I  will completely discontinue amlodipine and switch the patient to atenolol 50 mg by mouth daily indefinitely. If the swelling is not better, I would recommend checking a 2-D echocardiogram of the heart to evaluate for any signs of decreased CHF.

## 2014-10-07 LAB — CBC WITH DIFFERENTIAL/PLATELET
BASOS ABS: 0.1 10*3/uL (ref 0.0–0.1)
Basophils Relative: 1 % (ref 0–1)
Eosinophils Absolute: 0.8 10*3/uL — ABNORMAL HIGH (ref 0.0–0.7)
Eosinophils Relative: 11 % — ABNORMAL HIGH (ref 0–5)
HCT: 36 % — ABNORMAL LOW (ref 39.0–52.0)
Hemoglobin: 12.8 g/dL — ABNORMAL LOW (ref 13.0–17.0)
LYMPHS PCT: 32 % (ref 12–46)
Lymphs Abs: 2.4 10*3/uL (ref 0.7–4.0)
MCH: 30 pg (ref 26.0–34.0)
MCHC: 35.6 g/dL (ref 30.0–36.0)
MCV: 84.3 fL (ref 78.0–100.0)
MPV: 9.4 fL (ref 8.6–12.4)
Monocytes Absolute: 0.6 10*3/uL (ref 0.1–1.0)
Monocytes Relative: 8 % (ref 3–12)
Neutro Abs: 3.6 10*3/uL (ref 1.7–7.7)
Neutrophils Relative %: 48 % (ref 43–77)
Platelets: 232 10*3/uL (ref 150–400)
RBC: 4.27 MIL/uL (ref 4.22–5.81)
RDW: 13.5 % (ref 11.5–15.5)
WBC: 7.5 10*3/uL (ref 4.0–10.5)

## 2014-10-07 LAB — COMPLETE METABOLIC PANEL WITH GFR
ALBUMIN: 3.9 g/dL (ref 3.5–5.2)
ALK PHOS: 78 U/L (ref 39–117)
ALT: 17 U/L (ref 0–53)
AST: 21 U/L (ref 0–37)
BUN: 9 mg/dL (ref 6–23)
CALCIUM: 9.4 mg/dL (ref 8.4–10.5)
CO2: 25 meq/L (ref 19–32)
CREATININE: 0.8 mg/dL (ref 0.50–1.35)
Chloride: 99 mEq/L (ref 96–112)
GFR, Est African American: >89 mL/min
GFR, Est Non African American: >89 mL/min
Glucose, Bld: 100 mg/dL — ABNORMAL HIGH (ref 70–99)
Potassium: 3.5 mEq/L (ref 3.5–5.3)
Sodium: 137 mEq/L (ref 135–145)
TOTAL PROTEIN: 6.8 g/dL (ref 6.0–8.3)
Total Bilirubin: 0.4 mg/dL (ref 0.2–1.2)

## 2014-10-14 ENCOUNTER — Telehealth: Payer: Self-pay | Admitting: Family Medicine

## 2014-10-14 MED ORDER — ATENOLOL 50 MG PO TABS: 50.0000 mg | ORAL_TABLET | Freq: Every day | ORAL | Status: DC

## 2014-10-14 NOTE — Telephone Encounter (Signed)
Pt called and states that the Bystolic is working well for him and would like to have his Amlodipine changed to the generic version - I informed him there was no generic version of Bystolic but per [facility] lov note will change to Atenolol 50mg  qd. Med sent to pharm and pt aware.

## 2015-03-06 ENCOUNTER — Other Ambulatory Visit: Payer: Self-pay | Admitting: Family Medicine

## 2015-03-08 NOTE — Telephone Encounter (Signed)
Prescription sent to pharmacy.

## 2015-03-08 NOTE — Telephone Encounter (Signed)
Ok to refill 

## 2015-03-08 NOTE — Telephone Encounter (Signed)
ok 

## 2015-03-20 ENCOUNTER — Other Ambulatory Visit: Payer: Self-pay | Admitting: Family Medicine

## 2015-04-13 ENCOUNTER — Other Ambulatory Visit: Payer: Self-pay | Admitting: *Deleted

## 2015-04-13 MED ORDER — PRAVASTATIN SODIUM 20 MG PO TABS: 20.0000 mg | ORAL_TABLET | Freq: Every day | ORAL | Status: DC

## 2015-04-13 MED ORDER — IRBESARTAN-HYDROCHLOROTHIAZIDE 300-12.5 MG PO TABS: ORAL_TABLET | ORAL | Status: DC

## 2015-04-13 MED ORDER — ATENOLOL 50 MG PO TABS: 50.0000 mg | ORAL_TABLET | Freq: Every day | ORAL | Status: DC

## 2015-04-13 NOTE — Telephone Encounter (Signed)
Received fax requesting refill on Atenolol, Pravastatin, and Irbesartan/HCTZ.  Refill appropriate and filled per protocol.

## 2015-05-03 ENCOUNTER — Other Ambulatory Visit: Payer: Self-pay | Admitting: Family Medicine

## 2015-05-03 MED ORDER — IRBESARTAN-HYDROCHLOROTHIAZIDE 300-12.5 MG PO TABS: ORAL_TABLET | ORAL | Status: DC

## 2015-05-03 MED ORDER — PRAVASTATIN SODIUM 20 MG PO TABS: 20.0000 mg | ORAL_TABLET | Freq: Every day | ORAL | Status: DC

## 2015-05-03 MED ORDER — ATENOLOL 50 MG PO TABS: 50.0000 mg | ORAL_TABLET | Freq: Every day | ORAL | Status: DC

## 2015-05-03 NOTE — Telephone Encounter (Addendum)
Patient calling to say that [facility] but have not heard anything from [place]

## 2015-05-03 NOTE — Telephone Encounter (Signed)
Received call from patient.   States that he requires refills on Irbesartan/ HCTZ, Pravastatin and Atenolol to go to [facility].   Refill appropriate and filled per protocol.

## 2015-05-06 ENCOUNTER — Encounter: Payer: Self-pay | Admitting: Family Medicine

## 2015-05-06 ENCOUNTER — Ambulatory Visit (INDEPENDENT_AMBULATORY_CARE_PROVIDER_SITE_OTHER): Payer: 59 | Admitting: Family Medicine

## 2015-05-06 VITALS — BP 164/90 | HR 76 | Temp 98.0°F | Resp 18 | Ht 74.0 in | Wt 205.0 lb

## 2015-05-06 DIAGNOSIS — E785 Hyperlipidemia, unspecified: Secondary | ICD-10-CM | POA: Diagnosis not present

## 2015-05-06 DIAGNOSIS — Z125 Encounter for screening for malignant neoplasm of prostate: Secondary | ICD-10-CM

## 2015-05-06 DIAGNOSIS — I10 Essential (primary) hypertension: Secondary | ICD-10-CM

## 2015-05-06 DIAGNOSIS — Z23 Encounter for immunization: Secondary | ICD-10-CM | POA: Diagnosis not present

## 2015-05-06 NOTE — Progress Notes (Signed)
Subjective:    Patient ID: Keith Pratt, male    DOB: 06-Jan-1951, 65 y.o.   MRN: [id number]  HPI The patient is here today for follow-up of his hypertension and hyperlipidemia. His blood pressure today is elevated at 164/90. He was running late to the office and he believes he got agitated and worried running late and he believes this is why his blood pressure is high. It has not been high in the past. He has had Pneumovax 23 and he received his flu shot at his job. However he is due for Prevnar 13. He is also due for fasting lipid panel. He is also due for a PSA. The remainder of his preventative care is up-to-date Past Medical History  Diagnosis Date  . Hyperlipidemia   . Hypertension   . Arthritis   . Cancer ([place])     basal cell CA-under left arm  . Ulcer   . Thyroid disease     as a child- no problem as adult  . Shingles     July 2014   Past Surgical History  Procedure Laterality Date  . Cataract extraction      right eye  . Right arm surgery    . Colonoscopy    . Tonsillectomy     Current Outpatient Prescriptions on File Prior to Visit  Medication Sig Dispense Refill  . atenolol (TENORMIN) 50 MG tablet Take 1 tablet (50 mg total) by mouth daily. 90 tablet 3  . fluticasone (FLONASE) 50 MCG/ACT nasal spray Place 2 sprays into both nostrils daily. 16 g 6  . irbesartan-hydrochlorothiazide (AVALIDE) 300-12.5 MG tablet TAKE 1 TABLET BY MOUTH DAILY IN THE MORNING AS NEEDED FOR BLOOD PRESSURE 90 tablet 3  . pravastatin (PRAVACHOL) 20 MG tablet Take 1 tablet (20 mg total) by mouth daily. 90 tablet 3  . VIAGRA 100 MG tablet TAKE 0.5-1 TABLETS (50-100 MG TOTAL) BY MOUTH DAILY AS NEEDED FOR ERECTILE DYSFUNCTION. 5 tablet 7   No current facility-administered medications on file prior to visit.   Allergies  Allergen Reactions  . Codeine     REACTION: nauses and vomiting  . Amlodipine Swelling    Swelling in feet and ankles   Social History   Social History  . Marital  Status: Divorced    Spouse Name: N/A  . Number of Children: N/A  . Years of Education: N/A   Occupational History  . Not on file.   Social History Main Topics  . Smoking status: Former Smoker    Quit date: 01/08/2003  . Smokeless tobacco: Never Used  . Alcohol Use: No  . Drug Use: No  . Sexual Activity: Not on file   Other Topics Concern  . Not on file   Social History Narrative      Review of Systems  All other systems reviewed and are negative.      Objective:   Physical Exam  Constitutional: He appears well-developed and well-nourished. No distress.  Neck: Neck supple. No JVD present. No thyromegaly present.  Cardiovascular: Normal rate, regular rhythm, normal heart sounds and intact distal pulses.  Exam reveals no gallop.   No murmur heard. Pulmonary/Chest: Effort normal and breath sounds normal. No respiratory distress. He has no wheezes. He has no rales.  Abdominal: Soft. Bowel sounds are normal. He exhibits no distension. There is no tenderness. There is no rebound and no guarding.  Musculoskeletal: He exhibits no edema.  Lymphadenopathy:    He has no cervical adenopathy.  Skin: He is not diaphoretic.  Vitals reviewed.         Assessment & Plan:  HLD (hyperlipidemia) - Plan: CBC with Differential/Platelet, COMPLETE METABOLIC PANEL WITH GFR, Lipid panel  Essential hypertension - Plan: CBC with Differential/Platelet, COMPLETE METABOLIC PANEL WITH GFR, Lipid panel  Prostate cancer screening - Plan: PSA  Blood pressure is extremely high today. I would like him to come in in the morning for fasting lab work including a fasting lipid panel, CMP as well as a PSA. Goal LDL cholesterol is less than 130. I would like to recheck his blood pressure in the morning. If his blood pressure is still elevated, I would add amlodipine to the medication he is already taking. He received Prevnar 13 today in office. We will perform a PSA for prostate cancer screening.

## 2015-05-06 NOTE — Addendum Note (Signed)
Addended by: Shary Decamp B on: 05/06/2015 04:41 PM   Modules accepted: Orders

## 2015-05-07 ENCOUNTER — Other Ambulatory Visit: Payer: 59

## 2015-05-07 LAB — COMPLETE METABOLIC PANEL WITH GFR
ALBUMIN: 4.1 g/dL (ref 3.6–5.1)
ALK PHOS: 82 U/L (ref 40–115)
ALT: 22 U/L (ref 9–46)
AST: 21 U/L (ref 10–35)
BILIRUBIN TOTAL: 0.6 mg/dL (ref 0.2–1.2)
BUN: 13 mg/dL (ref 7–25)
CO2: 26 mmol/L (ref 20–31)
Calcium: 9.7 mg/dL (ref 8.6–10.3)
Chloride: 99 mmol/L (ref 98–110)
Creat: 0.93 mg/dL (ref 0.70–1.25)
GFR, EST NON AFRICAN AMERICAN: 85 mL/min (ref >=60)
Glucose, Bld: 92 mg/dL (ref 70–99)
Potassium: 4.2 mmol/L (ref 3.5–5.3)
Sodium: 136 mmol/L (ref 135–146)
TOTAL PROTEIN: 6.8 g/dL (ref 6.1–8.1)

## 2015-05-07 LAB — CBC WITH DIFFERENTIAL/PLATELET
Basophils Absolute: 0.1 10*3/uL (ref 0.0–0.1)
Basophils Relative: 1 % (ref 0–1)
EOS ABS: 0.7 10*3/uL (ref 0.0–0.7)
Eosinophils Relative: 9 % — ABNORMAL HIGH (ref 0–5)
HCT: 41.2 % (ref 39.0–52.0)
Hemoglobin: 14.1 g/dL (ref 13.0–17.0)
Lymphocytes Relative: 28 % (ref 12–46)
Lymphs Abs: 2.3 10*3/uL (ref 0.7–4.0)
MCH: 29 pg (ref 26.0–34.0)
MCHC: 34.2 g/dL (ref 30.0–36.0)
MCV: 84.8 fL (ref 78.0–100.0)
MONO ABS: 0.6 10*3/uL (ref 0.1–1.0)
MONOS PCT: 8 % (ref 3–12)
MPV: 9.5 fL (ref 8.6–12.4)
NEUTROS PCT: 54 % (ref 43–77)
Neutro Abs: 4.4 10*3/uL (ref 1.7–7.7)
PLATELETS: 242 10*3/uL (ref 150–400)
RBC: 4.86 MIL/uL (ref 4.22–5.81)
RDW: 13.1 % (ref 11.5–15.5)
WBC: 8.1 10*3/uL (ref 4.0–10.5)

## 2015-05-07 LAB — LIPID PANEL
Cholesterol: 165 mg/dL (ref 125–200)
HDL: 33 mg/dL — AB (ref >=40)
LDL Cholesterol: 84 mg/dL (ref <130)
TRIGLYCERIDES: 238 mg/dL — AB (ref <150)
Total CHOL/HDL Ratio: 5 Ratio (ref <=5.0)
VLDL: 48 mg/dL — ABNORMAL HIGH (ref <30)

## 2015-05-08 LAB — PSA: PSA: 2.34 ng/mL (ref <=4.00)

## 2015-07-13 ENCOUNTER — Telehealth: Payer: Self-pay | Admitting: Family Medicine

## 2015-07-13 NOTE — Telephone Encounter (Signed)
Patient is calling to speak to you regarding his insurance not covering prescription of viagra  [phone]

## 2015-07-14 NOTE — Telephone Encounter (Signed)
Pt states that is his insurance will not cover and ED medications - ok to send in for generic Viagra to [facility] drug?  (will cover 50 tab of sildenafil for $100)

## 2015-07-15 MED ORDER — SILDENAFIL CITRATE 100 MG PO TABS: ORAL_TABLET | ORAL | Status: DC

## 2015-07-15 NOTE — Telephone Encounter (Signed)
Medication called/sent to requested pharmacy  

## 2015-07-15 NOTE — Telephone Encounter (Signed)
Ok with that.

## 2015-08-18 ENCOUNTER — Telehealth: Payer: Self-pay | Admitting: Family Medicine

## 2015-08-18 MED ORDER — ATENOLOL 50 MG PO TABS: 50.0000 mg | ORAL_TABLET | Freq: Every day | ORAL | Status: DC

## 2015-08-18 MED ORDER — PRAVASTATIN SODIUM 20 MG PO TABS: 20.0000 mg | ORAL_TABLET | Freq: Every day | ORAL | Status: DC

## 2015-08-18 NOTE — Telephone Encounter (Signed)
Pt is requesting that the prescriptions for Pravastatin 20 mg and Atenolol 50 mg be changed to a 90 day supply.  [facility]

## 2015-08-18 NOTE — Telephone Encounter (Signed)
Medication called/sent to requested pharmacy  

## 2015-11-01 ENCOUNTER — Encounter: Payer: Self-pay | Admitting: Family Medicine

## 2015-11-01 ENCOUNTER — Ambulatory Visit (INDEPENDENT_AMBULATORY_CARE_PROVIDER_SITE_OTHER): Payer: PPO | Admitting: Family Medicine

## 2015-11-01 VITALS — BP 140/82 | HR 72 | Temp 97.5°F | Resp 16 | Ht 74.0 in | Wt 211.0 lb

## 2015-11-01 DIAGNOSIS — K219 Gastro-esophageal reflux disease without esophagitis: Secondary | ICD-10-CM

## 2015-11-01 DIAGNOSIS — D485 Neoplasm of uncertain behavior of skin: Secondary | ICD-10-CM | POA: Diagnosis not present

## 2015-11-01 NOTE — Progress Notes (Signed)
Subjective:    Patient ID: Keith Pratt, male    DOB: [birth year], 65 y.o.   MRN: [id number]  HPI For the last month or so, the patient reports increasing gas, bloating, stomach indigestion. He is taking Rolaids frequently. There is no exercise component. There is no weight loss fevers chills black tarry stool or blood in his stool. It does seem to be associated with eating spicy foods and drinking coffee.  He also occasionally has right upper quadrant sharp pains that radiate into his back similar to gallstones. He has been taking Rolaids frequently with minimal relief.  He also has a skin tag in his right axilla that he is requesting removal. It is a 4-5 mm pedunculated skin tag which I quickly removed with scissors and use Drysol for hemostasis without complication.  Past Medical History  Diagnosis Date  . Hyperlipidemia   . Hypertension   . Arthritis   . Cancer ([place])     basal cell CA-under left arm  . Ulcer   . Thyroid disease     as a child- no problem as adult  . Shingles     July 2014   Past Surgical History  Procedure Laterality Date  . Cataract extraction      right eye  . Right arm surgery    . Colonoscopy    . Tonsillectomy     Current Outpatient Prescriptions on File Prior to Visit  Medication Sig Dispense Refill  . atenolol (TENORMIN) 50 MG tablet Take 1 tablet (50 mg total) by mouth daily. 90 tablet 3  . fluticasone (FLONASE) 50 MCG/ACT nasal spray Place 2 sprays into both nostrils daily. 16 g 6  . irbesartan-hydrochlorothiazide (AVALIDE) 300-12.5 MG tablet TAKE 1 TABLET BY MOUTH DAILY IN THE MORNING AS NEEDED FOR BLOOD PRESSURE 90 tablet 3  . pravastatin (PRAVACHOL) 20 MG tablet Take 1 tablet (20 mg total) by mouth daily. 90 tablet 3  . sildenafil (VIAGRA) 100 MG tablet TAKE 0.5-1 TABLETS (50-100 MG TOTAL) BY MOUTH DAILY AS NEEDED FOR ERECTILE DYSFUNCTION. 50 tablet 3   No current facility-administered medications on file prior to visit.   Allergies    Allergen Reactions  . Codeine     REACTION: nauses and vomiting  . Amlodipine Swelling    Swelling in feet and ankles   Social History   Social History  . Marital Status: Divorced    Spouse Name: N/A  . Number of Children: N/A  . Years of Education: N/A   Occupational History  . Not on file.   Social History Main Topics  . Smoking status: Former Smoker    Quit date: 01/08/2003  . Smokeless tobacco: Never Used  . Alcohol Use: No  . Drug Use: No  . Sexual Activity: Not on file   Other Topics Concern  . Not on file   Social History Narrative      Review of Systems  All other systems reviewed and are negative.      Objective:   Physical Exam  Constitutional: He appears well-developed and well-nourished. No distress.  Neck: Neck supple. No JVD present. No thyromegaly present.  Cardiovascular: Normal rate, regular rhythm, normal heart sounds and intact distal pulses.  Exam reveals no gallop.   No murmur heard. Pulmonary/Chest: Effort normal and breath sounds normal. No respiratory distress. He has no wheezes. He has no rales.  Abdominal: Soft. Bowel sounds are normal. He exhibits no distension. There is no tenderness. There is no rebound and  no guarding.  Musculoskeletal: He exhibits no edema.  Lymphadenopathy:    He has no cervical adenopathy.  Skin: He is not diaphoretic.  Vitals reviewed.         Assessment & Plan:  Gastroesophageal reflux disease without esophagitis  Neoplasm of uncertain behavior of skin  Skin tag was removed without difficulty. There was no reason to perform a biopsy. I will not charge the patient for this as it only took of instant today. I believe his symptoms are gastroesophageal reflux disease. Begin Nexium 40 mg by mouth daily and recheck in 1-2 weeks. If symptoms persist, consider pancreatitis, biliary colic, and peptic ulcer disease on the differential diagnosis and proceed with further workup at that time if symptoms persist. We  discussed dietary changes to address this and I also recommended weight loss.

## 2015-11-12 ENCOUNTER — Telehealth: Payer: Self-pay | Admitting: Family Medicine

## 2015-11-12 MED ORDER — ESOMEPRAZOLE MAGNESIUM 40 MG PO CPDR: 40.0000 mg | DELAYED_RELEASE_CAPSULE | Freq: Every day | ORAL | 3 refills | Status: DC

## 2015-11-12 NOTE — Telephone Encounter (Signed)
CB# [phone]  Patient states he would like a generic of nexium [facility]. He states he was given samples and it works good.

## 2015-11-12 NOTE — Telephone Encounter (Signed)
Medication called/sent to requested pharmacy  

## 2015-11-16 ENCOUNTER — Telehealth: Payer: Self-pay | Admitting: Family Medicine

## 2015-11-16 MED ORDER — OMEPRAZOLE 40 MG PO CPDR: 40.0000 mg | DELAYED_RELEASE_CAPSULE | Freq: Every day | ORAL | 3 refills | Status: DC

## 2015-11-16 NOTE — Telephone Encounter (Signed)
Patient is calling to say that the nexium is too expensive and wants to know what else can be prescribed  [phone] [facility]

## 2015-11-16 NOTE — Telephone Encounter (Signed)
Medication called/sent to requested pharmacy  

## 2015-11-16 NOTE — Telephone Encounter (Signed)
Switch to omeprazole 40 mg poqday

## 2015-11-19 ENCOUNTER — Telehealth: Payer: Self-pay | Admitting: *Deleted

## 2015-11-19 NOTE — Telephone Encounter (Signed)
Patient called stating his Nexium is going to cost 350$ and that is being generic. Please advise [phone].

## 2015-11-19 NOTE — Telephone Encounter (Signed)
Omeprazole was sent to pharm on 11/16/15 and pt aware

## 2016-03-13 ENCOUNTER — Ambulatory Visit (INDEPENDENT_AMBULATORY_CARE_PROVIDER_SITE_OTHER): Payer: PPO | Admitting: Family Medicine

## 2016-03-13 ENCOUNTER — Encounter: Payer: Self-pay | Admitting: Family Medicine

## 2016-03-13 VITALS — BP 144/78 | HR 60 | Temp 97.5°F | Resp 14 | Ht 74.0 in | Wt 213.0 lb

## 2016-03-13 DIAGNOSIS — R001 Bradycardia, unspecified: Secondary | ICD-10-CM

## 2016-03-13 DIAGNOSIS — I10 Essential (primary) hypertension: Secondary | ICD-10-CM | POA: Diagnosis not present

## 2016-03-13 DIAGNOSIS — R0602 Shortness of breath: Secondary | ICD-10-CM

## 2016-03-13 DIAGNOSIS — I440 Atrioventricular block, first degree: Secondary | ICD-10-CM

## 2016-03-13 DIAGNOSIS — J0110 Acute frontal sinusitis, unspecified: Secondary | ICD-10-CM

## 2016-03-13 MED ORDER — PREDNISONE 20 MG PO TABS: 20.0000 mg | ORAL_TABLET | Freq: Every day | ORAL | 0 refills | Status: DC

## 2016-03-13 MED ORDER — AMOXICILLIN 875 MG PO TABS: 875.0000 mg | ORAL_TABLET | Freq: Two times a day (BID) | ORAL | 0 refills | Status: DC

## 2016-03-13 NOTE — Progress Notes (Signed)
   Subjective:    Patient ID: Keith Pratt, male    DOB: 1951-02-10, 65 y.o.   MRN: [id number]  Patient presents for Wheezing (x2 months- wheezing and squeaking in lungs- sinus pressure)  Patient here with intermittent episodes of nasal congestion and sinus pressure over the past couple months. The past 3-4 weeks he's also noted some wheezing when he lays back. He's been using a nasal antihistamine which helps as well as vapor rub was seen to open him up more last night and he did not have any wheezing. He quit smoking back in 2004. He does get short of breath with some of his activities which is new. He denies any chest pain. Denies any leg swelling. He does have hypertension. He gets SOB when laying back as well. He had chest x-ray back in 2010 that showed some mild peribronchial thickening otherwise normal  Review Of Systems:  GEN- denies fatigue, fever, weight loss,weakness, recent illness HEENT- denies eye drainage, change in vision, +nasal discharge, CVS- denies chest pain, palpitations RESP-+ SOB, cough,+ wheeze ABD- denies N/V, change in stools, abd pain GU- denies dysuria, hematuria, dribbling, incontinence MSK- denies joint pain, muscle aches, injury Neuro- denies headache, dizziness, syncope, seizure activity       Objective:    BP (!) 144/78 (BP Location: Left Arm, Patient Position: Sitting, Cuff Size: Large)   Pulse 60   Temp 97.5 F (36.4 C) (Oral)   Resp 14   Ht 6\' 2"  (1.88 m)   Wt 213 lb (96.6 kg)   SpO2 98%   BMI 27.35 kg/m  GEN- NAD, alert and oriented x3 HEENT- PERRL, EOMI, non injected sclera, pink conjunctiva, MMM, oropharynx clear, nares clear rhinorrea, mild frontal sinus tenderness, TM clear bilat no effusion  Neck- Supple, no LAD  CVS- bradycardia  no murmur, occ PVC  RESP-CTAB EXT- No edema Pulses- Radial, DP- 2+  EKG- sinus brady, 1st degree AV Block      Assessment & Plan:      Problem List Items Addressed This Visit    Hypertension -  Primary    BP elevated but in setting of his bradycardia, will decrease atenolol to 25mg  once a day  Refer to cardiology for his  SOB symptoms in setting of bradycardia and Heart block. Previous smoker, so CXR will be obtained Treat the sinusitis and will use prednisone to see if this helps with wheeze I think there are 2 different issues, the acute illness and the chronic respiratory symptoms  DD- heart block, CHF may be right sided but no fluid overload, COPD       Other Visit Diagnoses    SOB (shortness of breath)       Relevant Orders   EKG 12-Lead (Completed)   Acute non-recurrent frontal sinusitis       Relevant Medications   amoxicillin (AMOXIL) 875 MG tablet   predniSONE (DELTASONE) 20 MG tablet   Sinus bradycardia       1st degree AV block          Note: This dictation was prepared with Dragon dictation along with smaller phrase technology. Any transcriptional errors that result from this process are unintentional.

## 2016-03-13 NOTE — Patient Instructions (Addendum)
Decrease the atenolol to 1/2 tablet  Cardiology referral  Chest xray to be done - [place] [address] ([place])  F/U pending results

## 2016-03-13 NOTE — Assessment & Plan Note (Signed)
BP elevated but in setting of his bradycardia, will decrease atenolol to 25mg  once a day  Refer to cardiology for his  SOB symptoms in setting of bradycardia and Heart block. Previous smoker, so CXR will be obtained Treat the sinusitis and will use prednisone to see if this helps with wheeze I think there are 2 different issues, the acute illness and the chronic respiratory symptoms  DD- heart block, CHF may be right sided but no fluid overload, COPD

## 2016-03-16 ENCOUNTER — Other Ambulatory Visit: Payer: Self-pay | Admitting: *Deleted

## 2016-03-16 ENCOUNTER — Ambulatory Visit
Admission: RE | Admit: 2016-03-16 | Discharge: 2016-03-16 | Disposition: A | Discharge status: Home / Self Care | Payer: PPO | Source: Ambulatory Visit | Attending: Physician Assistant | Admitting: Physician Assistant

## 2016-03-16 DIAGNOSIS — R05 Cough: Secondary | ICD-10-CM

## 2016-03-16 DIAGNOSIS — R059 Cough, unspecified: Secondary | ICD-10-CM

## 2016-03-16 DIAGNOSIS — R062 Wheezing: Secondary | ICD-10-CM | POA: Diagnosis not present

## 2016-03-27 NOTE — Progress Notes (Signed)
HPI: 65 year old male for evaluation of dyspnea. Chest x-ray November 2017 showed mild hyperinflation consistent with reactive airway disease or COPD. Patient has noted over the past year increased dyspnea on exertion. There is no orthopnea, PND or pedal edema. There is mild chest tightness with his dyspnea but he does not have otherwise exertional chest pain. No syncope.  Current Outpatient Prescriptions  Medication Sig Dispense Refill  . atenolol (TENORMIN) 50 MG tablet Take 1 tablet (50 mg total) by mouth daily. (Patient taking differently: Take 25 mg by mouth daily. ) 90 tablet 3  . irbesartan-hydrochlorothiazide (AVALIDE) 300-12.5 MG tablet TAKE 1 TABLET BY MOUTH DAILY IN THE MORNING AS NEEDED FOR BLOOD PRESSURE (Patient taking differently: Take 1 tablet by mouth daily. ) 90 tablet 3  . omeprazole (PRILOSEC) 40 MG capsule Take 1 capsule (40 mg total) by mouth daily. 90 capsule 3  . pravastatin (PRAVACHOL) 20 MG tablet Take 1 tablet (20 mg total) by mouth daily. 90 tablet 3  . sildenafil (VIAGRA) 100 MG tablet TAKE 0.5-1 TABLETS (50-100 MG TOTAL) BY MOUTH DAILY AS NEEDED FOR ERECTILE DYSFUNCTION. 50 tablet 3   No current facility-administered medications for this visit.     Allergies  Allergen Reactions  . Codeine     REACTION: nauses and vomiting  . Amlodipine Swelling    Swelling in feet and ankles     Past Medical History:  Diagnosis Date  . Anxiety   . Arthritis   . Cancer ([place])    basal cell CA-under left arm  . COPD (chronic obstructive pulmonary disease) ([place])   . Hyperlipidemia   . Hypertension   . Shingles    July 2014  . Thyroid disease    as a child- no problem as adult  . Ulcer [facility])     Past Surgical History:  Procedure Laterality Date  . CATARACT EXTRACTION     right eye  . COLONOSCOPY    . right arm surgery    . TONSILLECTOMY      Social History   Social History  . Marital status: Divorced    Spouse name: N/A  . Number of children: N/A    . Years of education: N/A   Occupational History  . Not on file.   Social History Main Topics  . Smoking status: Former Smoker    Quit date: 01/08/2003  . Smokeless tobacco: Never Used  . Alcohol use No  . Drug use: No  . Sexual activity: Not on file   Other Topics Concern  . Not on file   Social History Narrative  . No narrative on file    Family History  Problem Relation Age of Onset  . Colon polyps Mother   . Colon cancer Neg Hx   . Esophageal cancer Neg Hx   . Rectal cancer Neg Hx   . Stomach cancer Neg Hx     ROS: no fevers or chills, productive cough, hemoptysis, dysphasia, odynophagia, melena, hematochezia, dysuria, hematuria, rash, seizure activity, orthopnea, PND, pedal edema, claudication. Remaining systems are negative.  Physical Exam:   Blood pressure (!) 142/84, pulse 67, height 6\' 2"  (1.88 m), weight 214 lb (97.1 kg).  General:  Well developed/well nourished in NAD Skin warm/dry Patient not depressed No peripheral clubbing Back-normal HEENT-normal/normal eyelids Neck supple/normal carotid upstroke bilaterally; no bruits; no JVD; no thyromegaly chest - CTA/ normal expansion CV - RRR/normal S1 and S2; no murmurs, rubs or gallops;  PMI nondisplaced Abdomen -NT/ND, no HSM,  no mass, + bowel sounds, no bruit 2+ femoral pulses, no bruits Ext-no edema, chords, 2+ DP Neuro-grossly nonfocal  ECG 03/13/2016-sinus bradycardia at a rate of 54, first-degree AV block. Electrocardiogram today shows sinus rhythm with first-degree AV block and no ST changes.  A/P  1 Dyspnea-possibly secondary to deconditioning and weight gain by his report. There may also be a contribution from mild COPD noted on chest x-ray. We will exclude cardiac contribution. Schedule exercise treadmill for risk stratification. Echocardiogram to assess LV function.  2 bruit-arrange abdominal ultrasound to exclude aneurysm.  3 hypertension-blood pressure mildly elevated. He will follow this  and medications can be advanced as indicated.  4 hyperlipidemia-continue statin.  Kirk Ruths, MD

## 2016-03-30 ENCOUNTER — Encounter: Payer: Self-pay | Admitting: Cardiology

## 2016-03-30 ENCOUNTER — Ambulatory Visit (INDEPENDENT_AMBULATORY_CARE_PROVIDER_SITE_OTHER): Payer: PPO | Admitting: Cardiology

## 2016-03-30 VITALS — BP 142/84 | HR 67 | Ht 74.0 in | Wt 214.0 lb

## 2016-03-30 DIAGNOSIS — R0600 Dyspnea, unspecified: Secondary | ICD-10-CM

## 2016-03-30 DIAGNOSIS — R0989 Other specified symptoms and signs involving the circulatory and respiratory systems: Secondary | ICD-10-CM

## 2016-03-30 NOTE — Patient Instructions (Signed)
Medication Instructions:   NO CHANGE  Testing/Procedures:  Your physician has requested that you have an exercise tolerance test. For further information please visit [email or web address]. Please also follow instruction sheet, as given.   Your physician has requested that you have an echocardiogram. Echocardiography is a painless test that uses sound waves to create images of your heart. It provides your doctor with information about the size and shape of your heart and how well your heart's chambers and valves are working. This procedure takes approximately one hour. There are no restrictions for this procedure.   Your physician has requested that you have an abdominal aorta duplex. During this test, an ultrasound is used to evaluate the aorta. Allow 30 minutes for this exam. Do not eat after midnight the day before and avoid carbonated beverages   Follow-Up:  Your physician recommends that you schedule a follow-up appointment in: AS NEEDED PENDING TEST RESULTS

## 2016-04-27 ENCOUNTER — Ambulatory Visit (HOSPITAL_COMMUNITY): Payer: PPO | Attending: Cardiovascular Disease

## 2016-04-27 ENCOUNTER — Other Ambulatory Visit: Payer: Self-pay

## 2016-04-27 DIAGNOSIS — Z87891 Personal history of nicotine dependence: Secondary | ICD-10-CM | POA: Diagnosis not present

## 2016-04-27 DIAGNOSIS — I10 Essential (primary) hypertension: Secondary | ICD-10-CM | POA: Insufficient documentation

## 2016-04-27 DIAGNOSIS — R0600 Dyspnea, unspecified: Secondary | ICD-10-CM | POA: Insufficient documentation

## 2016-04-28 DIAGNOSIS — M25561 Pain in right knee: Secondary | ICD-10-CM | POA: Diagnosis not present

## 2016-04-28 DIAGNOSIS — M25562 Pain in left knee: Secondary | ICD-10-CM | POA: Diagnosis not present

## 2016-04-28 DIAGNOSIS — Z471 Aftercare following joint replacement surgery: Secondary | ICD-10-CM | POA: Diagnosis not present

## 2016-04-28 DIAGNOSIS — Z96651 Presence of right artificial knee joint: Secondary | ICD-10-CM | POA: Diagnosis not present

## 2016-04-28 DIAGNOSIS — M1712 Unilateral primary osteoarthritis, left knee: Secondary | ICD-10-CM | POA: Diagnosis not present

## 2016-05-02 ENCOUNTER — Telehealth (HOSPITAL_COMMUNITY): Payer: Self-pay

## 2016-05-02 NOTE — Telephone Encounter (Signed)
Encounter complete. 

## 2016-05-04 ENCOUNTER — Inpatient Hospital Stay (HOSPITAL_COMMUNITY): Admission: RE | Admit: 2016-05-04 | Payer: PPO | Source: Ambulatory Visit

## 2016-05-10 ENCOUNTER — Other Ambulatory Visit: Payer: Self-pay | Admitting: Family Medicine

## 2016-05-10 NOTE — Telephone Encounter (Signed)
Medication refill for one time only.  Patient needs to be seen.  

## 2016-05-31 ENCOUNTER — Telehealth (HOSPITAL_COMMUNITY): Payer: Self-pay

## 2016-05-31 NOTE — Telephone Encounter (Signed)
Close encounter 

## 2016-06-02 ENCOUNTER — Ambulatory Visit (HOSPITAL_COMMUNITY)
Admission: RE | Admit: 2016-06-02 | Discharge: 2016-06-02 | Disposition: A | Discharge status: Home / Self Care | Payer: PPO | Source: Ambulatory Visit | Attending: Cardiology | Admitting: Cardiology

## 2016-06-02 DIAGNOSIS — R0989 Other specified symptoms and signs involving the circulatory and respiratory systems: Secondary | ICD-10-CM

## 2016-06-02 DIAGNOSIS — I714 Abdominal aortic aneurysm, without rupture: Secondary | ICD-10-CM | POA: Insufficient documentation

## 2016-06-02 DIAGNOSIS — E785 Hyperlipidemia, unspecified: Secondary | ICD-10-CM | POA: Insufficient documentation

## 2016-06-02 DIAGNOSIS — R0600 Dyspnea, unspecified: Secondary | ICD-10-CM | POA: Diagnosis not present

## 2016-06-02 DIAGNOSIS — I10 Essential (primary) hypertension: Secondary | ICD-10-CM | POA: Insufficient documentation

## 2016-06-02 DIAGNOSIS — I700 Atherosclerosis of aorta: Secondary | ICD-10-CM | POA: Diagnosis not present

## 2016-06-02 LAB — EXERCISE TOLERANCE TEST
CHL CUP MPHR: 153 {beats}/min
CHL RATE OF PERCEIVED EXERTION: 19
CSEPEW: 5.7 METS
CSEPPHR: 129 {beats}/min
Exercise duration (min): 3 min
Exercise duration (sec): 57 s
Percent HR: 84 %
Rest HR: 72 {beats}/min

## 2016-06-05 DIAGNOSIS — M1712 Unilateral primary osteoarthritis, left knee: Secondary | ICD-10-CM | POA: Diagnosis not present

## 2016-06-09 ENCOUNTER — Other Ambulatory Visit: Payer: Self-pay | Admitting: Family Medicine

## 2016-06-09 NOTE — Telephone Encounter (Signed)
Medication refilled per protocol. 

## 2016-06-12 DIAGNOSIS — M1712 Unilateral primary osteoarthritis, left knee: Secondary | ICD-10-CM | POA: Diagnosis not present

## 2016-06-19 DIAGNOSIS — M1712 Unilateral primary osteoarthritis, left knee: Secondary | ICD-10-CM | POA: Diagnosis not present

## 2016-06-29 ENCOUNTER — Ambulatory Visit (INDEPENDENT_AMBULATORY_CARE_PROVIDER_SITE_OTHER): Payer: PPO | Admitting: Family Medicine

## 2016-06-29 ENCOUNTER — Encounter: Payer: Self-pay | Admitting: Family Medicine

## 2016-06-29 VITALS — BP 132/70 | HR 64 | Temp 98.4°F | Resp 14 | Ht 74.0 in | Wt 216.0 lb

## 2016-06-29 DIAGNOSIS — S0502XA Injury of conjunctiva and corneal abrasion without foreign body, left eye, initial encounter: Secondary | ICD-10-CM | POA: Diagnosis not present

## 2016-06-29 DIAGNOSIS — L821 Other seborrheic keratosis: Secondary | ICD-10-CM

## 2016-06-29 MED ORDER — POLYMYXIN B-TRIMETHOPRIM 10000-0.1 UNIT/ML-% OP SOLN: 2.0000 [drp] | OPHTHALMIC | 0 refills | Status: DC

## 2016-06-29 NOTE — Progress Notes (Signed)
Subjective:    Patient ID: Keith Pratt, male    DOB: [birth year], 66 y.o.   MRN: [id number]  HPI  One week ago, the patient was mowing his neighbors yard. He felt like something flew in his left eye. He rubbed his eye vigorously wash his eye. He has had a foreign body sensation in not ever since. He denies any blurry vision. He denies any double vision. The conjunctiva is not red or injected or inflamed. Pupils are equal round reactive to light. Both lids were swept and everted and no foreign body was seen. Forcing exam was performed and on the cornea was a small vertical abrasion at approximate 7:00 from the pupil. So has a wartlike papule on his abdomen just above the umbilicus. It is 5 mm in diameter warty brown papule. He would like that removed Past Medical History:  Diagnosis Date  . Anxiety   . Arthritis   . Cancer ([place])    basal cell CA-under left arm  . COPD (chronic obstructive pulmonary disease) ([place])   . Hyperlipidemia   . Hypertension   . Shingles    July 2014  . Thyroid disease    as a child- no problem as adult  . Ulcer [facility])    Past Surgical History:  Procedure Laterality Date  . CATARACT EXTRACTION     right eye  . COLONOSCOPY    . right arm surgery    . TONSILLECTOMY     Current Outpatient Prescriptions on File Prior to Visit  Medication Sig Dispense Refill  . atenolol (TENORMIN) 50 MG tablet Take 1 tablet (50 mg total) by mouth daily. (Patient taking differently: Take 25 mg by mouth daily. ) 90 tablet 3  . irbesartan-hydrochlorothiazide (AVALIDE) 300-12.5 MG tablet TAKE 1 TABLET BY MOUTH DAILY IN THE MORNING AS NEEDED FOR BLOOD PRESSURE 30 tablet 0  . omeprazole (PRILOSEC) 40 MG capsule Take 1 capsule (40 mg total) by mouth daily. 90 capsule 3  . pravastatin (PRAVACHOL) 20 MG tablet Take 1 tablet (20 mg total) by mouth daily. 90 tablet 3  . sildenafil (VIAGRA) 100 MG tablet TAKE 0.5-1 TABLETS (50-100 MG TOTAL) BY MOUTH DAILY AS NEEDED FOR ERECTILE  DYSFUNCTION. 50 tablet 3   No current facility-administered medications on file prior to visit.    Allergies  Allergen Reactions  . Codeine     REACTION: nauses and vomiting  . Amlodipine Swelling    Swelling in feet and ankles   Social History   Social History  . Marital status: Divorced    Spouse name: N/A  . Number of children: N/A  . Years of education: N/A   Occupational History  . Not on file.   Social History Main Topics  . Smoking status: Former Smoker    Quit date: 01/08/2003  . Smokeless tobacco: Never Used  . Alcohol use No  . Drug use: No  . Sexual activity: Yes   Other Topics Concern  . Not on file   Social History Narrative  . No narrative on file     Review of Systems  All other systems reviewed and are negative.      Objective:   Physical Exam  Eyes: Lids are normal. Pupils are equal, round, and reactive to light. Lids are everted and swept, no foreign bodies found. Right eye exhibits no discharge and no exudate. No foreign body present in the right eye. Left eye exhibits no discharge and no exudate. No foreign body present in  the left eye. Right conjunctiva is not injected. Right conjunctiva has no hemorrhage. Left conjunctiva is not injected. Left conjunctiva has no hemorrhage.    Neck: Neck supple.  Cardiovascular: Normal rate, regular rhythm and normal heart sounds.   Pulmonary/Chest: Effort normal and breath sounds normal.  Lymphadenopathy:    He has no cervical adenopathy.  Vitals reviewed.         Assessment & Plan:  Abrasion of left cornea, initial encounter  Seborrheic keratoses  I believe the patient has a foreign body sensation in his left eye due to corneal abrasion. I see no residual evidence of a foreign body in the eye. Therefore I will treat the patient with Polytrim drops 2 drops every 2 hours while awake for the next 48 hours then every 4 hours until better. Recheck immediately if worsening. The superior keratosis on  his lower abdomen just above his umbilicus was treated with liquid nitrogen cryotherapy for a total of 30 seconds

## 2016-07-08 ENCOUNTER — Other Ambulatory Visit: Payer: Self-pay | Admitting: Family Medicine

## 2016-08-03 DIAGNOSIS — M25562 Pain in left knee: Secondary | ICD-10-CM | POA: Diagnosis not present

## 2016-08-03 DIAGNOSIS — M1712 Unilateral primary osteoarthritis, left knee: Secondary | ICD-10-CM | POA: Diagnosis not present

## 2016-08-07 ENCOUNTER — Other Ambulatory Visit: Payer: Self-pay | Admitting: Family Medicine

## 2016-08-07 NOTE — Telephone Encounter (Signed)
Medication refill for one time only.  Patient needs to be seen.  Letter sent for patient to call and schedule 

## 2016-08-10 ENCOUNTER — Ambulatory Visit (INDEPENDENT_AMBULATORY_CARE_PROVIDER_SITE_OTHER): Payer: PPO | Admitting: Family Medicine

## 2016-08-10 ENCOUNTER — Encounter: Payer: Self-pay | Admitting: Family Medicine

## 2016-08-10 VITALS — BP 180/104 | HR 74 | Temp 97.6°F | Resp 18 | Ht 74.0 in | Wt 214.0 lb

## 2016-08-10 DIAGNOSIS — R0602 Shortness of breath: Secondary | ICD-10-CM

## 2016-08-10 DIAGNOSIS — J439 Emphysema, unspecified: Secondary | ICD-10-CM

## 2016-08-10 NOTE — Progress Notes (Signed)
Subjective:    Patient ID: Keith Pratt, male    DOB: 08-22-1950, 66 y.o.   MRN: [id number]  HPI  Patient had a chest x-ray showed possible COPD in November. He then saw cardiology for workup of shortness of breath. Echocardiogram revealed an ejection fraction of 60-65% with some possible diastolic dysfunction. Stress test the heart was relatively normal. He presents today reporting wheezing at night and dyspnea on exertion. He has a 50+ pack year history of smoking. He denies any hemoptysis or pleurisy or fever or productive cough. Patient states that at night he will hear a whistling or wheezing sound particularly if he lies flat on his back. At work, he will become winded very easily with minimal activity.  Past Medical History:  Diagnosis Date  . AAA (abdominal aortic aneurysm) (HCC)    3.5 cm  . Anxiety   . Arthritis   . Cancer ([place])    basal cell CA-under left arm  . COPD (chronic obstructive pulmonary disease) ([place])   . Hyperlipidemia   . Hypertension   . Shingles    July 2014  . Thyroid disease    as a child- no problem as adult  . Ulcer    Past Surgical History:  Procedure Laterality Date  . CATARACT EXTRACTION     right eye  . COLONOSCOPY    . right arm surgery    . TONSILLECTOMY     Current Outpatient Prescriptions on File Prior to Visit  Medication Sig Dispense Refill  . atenolol (TENORMIN) 50 MG tablet TAKE 1 TABLET BY MOUTH EVERY DAY 30 tablet 0  . ibuprofen (ADVIL,MOTRIN) 200 MG tablet Take 800 mg by mouth every 6 (six) hours as needed.    . irbesartan-hydrochlorothiazide (AVALIDE) 300-12.5 MG tablet TAKE 1 TABLET BY MOUTH DAILY IN THE MORNING AS NEEDED FOR BLOOD PRESSURE 30 tablet 5  . omeprazole (PRILOSEC) 40 MG capsule Take 1 capsule (40 mg total) by mouth daily. 90 capsule 3  . pravastatin (PRAVACHOL) 20 MG tablet TAKE 1 TABLET BY MOUTH EVERY DAY 30 tablet 0  . sildenafil (VIAGRA) 100 MG tablet TAKE 0.5-1 TABLETS (50-100 MG TOTAL) BY MOUTH DAILY AS  NEEDED FOR ERECTILE DYSFUNCTION. 50 tablet 3   No current facility-administered medications on file prior to visit.    Allergies  Allergen Reactions  . Codeine     REACTION: nauses and vomiting  . Amlodipine Swelling    Swelling in feet and ankles   Social History   Social History  . Marital status: Divorced    Spouse name: N/A  . Number of children: N/A  . Years of education: N/A   Occupational History  . Not on file.   Social History Main Topics  . Smoking status: Former Smoker    Quit date: 01/08/2003  . Smokeless tobacco: Never Used  . Alcohol use No  . Drug use: No  . Sexual activity: Yes   Other Topics Concern  . Not on file   Social History Narrative  . No narrative on file      Review of Systems  All other systems reviewed and are negative.      Objective:   Physical Exam  Constitutional: He appears well-developed and well-nourished. No distress.  Neck: Neck supple. No JVD present. No thyromegaly present.  Cardiovascular: Normal rate, regular rhythm, normal heart sounds and intact distal pulses.  Exam reveals no gallop.   No murmur heard. Pulmonary/Chest: Effort normal and breath sounds normal. No respiratory  distress. He has no wheezes. He has no rales.  Abdominal: Soft. Bowel sounds are normal. He exhibits no distension. There is no tenderness. There is no rebound and no guarding.  Musculoskeletal: He exhibits no edema.  Lymphadenopathy:    He has no cervical adenopathy.  Skin: He is not diaphoretic.  Vitals reviewed.         Assessment & Plan:  SOB (shortness of breath)  Pulmonary emphysema, unspecified emphysema type ([place])  Patient's FEV1 is 75%. FEV is 4.07 L. FVC is 5.43 L. Lung volumes are greater than 100% of what is predicted. However the patient would like to try, Symbicort 160/4.5, 2 puffs twice a day for the next 2 weeks to see if this will help with his wheezing and shortness of breath. His blood pressure today is extremely high.  This is out of the norm for this patient. He has a long-standing history of well-controlled high blood pressure. He feels like he is under a lot of anxiety as his partner is leaving to move to [place] to live with her daughter. He is certainly worried about this and this could be contributing some to her shortness of breath as well as his blood pressure. He will start checking his blood pressure everyday for the next 2 weeks and I will see him back. We will determine the efficacy of the Symbicort and also evaluate possible treatment for anxiety versus treatment to lower his blood pressure. He is comfortable with this plan

## 2016-08-25 ENCOUNTER — Ambulatory Visit (INDEPENDENT_AMBULATORY_CARE_PROVIDER_SITE_OTHER): Payer: PPO | Admitting: Family Medicine

## 2016-08-25 ENCOUNTER — Encounter: Payer: Self-pay | Admitting: Family Medicine

## 2016-08-25 VITALS — BP 148/80 | HR 60 | Temp 97.9°F | Resp 16 | Ht 74.0 in | Wt 209.0 lb

## 2016-08-25 DIAGNOSIS — I10 Essential (primary) hypertension: Secondary | ICD-10-CM

## 2016-08-25 MED ORDER — DOXAZOSIN MESYLATE 4 MG PO TABS: 4.0000 mg | ORAL_TABLET | Freq: Every day | ORAL | 5 refills | Status: DC

## 2016-08-25 NOTE — Progress Notes (Signed)
   Subjective:    Patient ID: Keith Pratt, male    DOB: January 29, 1951, 66 y.o.   MRN: [id number]  HPI Patient's blood pressure remains elevated even on atenolol and avalide 300/12.5 poqday.  He is only taking half of the atenolol 50 because of bradycardia. His blood pressure here today is much better.  However at home, his blood pressure is still running between 741 and 287 systolic. He denies any chest pain shortness of breath or dyspnea on exertion Past Medical History:  Diagnosis Date  . AAA (abdominal aortic aneurysm) (HCC)    3.5 cm  . Anxiety   . Arthritis   . Cancer ([place])    basal cell CA-under left arm  . COPD (chronic obstructive pulmonary disease) ([place])   . Hyperlipidemia   . Hypertension   . Shingles    July 2014  . Thyroid disease    as a child- no problem as adult  . Ulcer    Past Surgical History:  Procedure Laterality Date  . CATARACT EXTRACTION     right eye  . COLONOSCOPY    . right arm surgery    . TONSILLECTOMY     Current Outpatient Prescriptions on File Prior to Visit  Medication Sig Dispense Refill  . atenolol (TENORMIN) 50 MG tablet TAKE 1 TABLET BY MOUTH EVERY DAY 30 tablet 0  . ibuprofen (ADVIL,MOTRIN) 200 MG tablet Take 800 mg by mouth every 6 (six) hours as needed.    . irbesartan-hydrochlorothiazide (AVALIDE) 300-12.5 MG tablet TAKE 1 TABLET BY MOUTH DAILY IN THE MORNING AS NEEDED FOR BLOOD PRESSURE 30 tablet 5  . omeprazole (PRILOSEC) 40 MG capsule Take 1 capsule (40 mg total) by mouth daily. 90 capsule 3  . pravastatin (PRAVACHOL) 20 MG tablet TAKE 1 TABLET BY MOUTH EVERY DAY 30 tablet 0  . sildenafil (VIAGRA) 100 MG tablet TAKE 0.5-1 TABLETS (50-100 MG TOTAL) BY MOUTH DAILY AS NEEDED FOR ERECTILE DYSFUNCTION. 50 tablet 3   No current facility-administered medications on file prior to visit.    Allergies  Allergen Reactions  . Codeine     REACTION: nauses and vomiting  . Amlodipine Swelling    Swelling in feet and ankles   Social  History   Social History  . Marital status: Divorced    Spouse name: N/A  . Number of children: N/A  . Years of education: N/A   Occupational History  . Not on file.   Social History Main Topics  . Smoking status: Former Smoker    Quit date: 01/08/2003  . Smokeless tobacco: Never Used  . Alcohol use No  . Drug use: No  . Sexual activity: Yes   Other Topics Concern  . Not on file   Social History Narrative  . No narrative on file      Review of Systems  All other systems reviewed and are negative.      Objective:   Physical Exam  Cardiovascular: Normal rate, regular rhythm and normal heart sounds.   Pulmonary/Chest: Effort normal and breath sounds normal. No respiratory distress. He has no wheezes. He has no rales.  Abdominal: Soft. Bowel sounds are normal.  Musculoskeletal: He exhibits no edema.  Vitals reviewed.         Assessment & Plan:  Benign essential HTN - Plan: doxazosin (CARDURA) 4 MG tablet  Essential hypertension  Add Cardura 4 mg by mouth daily and recheck blood pressure in one month

## 2016-08-29 ENCOUNTER — Telehealth: Payer: Self-pay | Admitting: Family Medicine

## 2016-08-29 NOTE — Telephone Encounter (Signed)
Patient aware of providers recommendations.  

## 2016-08-29 NOTE — Telephone Encounter (Signed)
PATIENT IS CALLING TO SAY THAT THE  BP MEDICATION THAT DR YEMVVKP PRESCRIBED IS MAKING HIM SICK LIKE HE IS GOING TO PASS OUT PLEASE [place]   [phone]

## 2016-08-29 NOTE — Telephone Encounter (Signed)
D/c cardura and call me with his bp in 1 month

## 2016-09-01 ENCOUNTER — Ambulatory Visit: Payer: PPO | Admitting: Family Medicine

## 2016-09-03 ENCOUNTER — Other Ambulatory Visit: Payer: Self-pay | Admitting: Family Medicine

## 2016-10-11 ENCOUNTER — Telehealth: Payer: Self-pay | Admitting: Family Medicine

## 2016-10-11 ENCOUNTER — Other Ambulatory Visit: Payer: Self-pay | Admitting: Family Medicine

## 2016-10-11 DIAGNOSIS — I10 Essential (primary) hypertension: Secondary | ICD-10-CM

## 2016-10-11 NOTE — Telephone Encounter (Signed)
Pt needs refill on amlodipine. Also wants to know if he can stop taking atenolol. Please call back.

## 2016-10-12 MED ORDER — PRAVASTATIN SODIUM 20 MG PO TABS: 20.0000 mg | ORAL_TABLET | Freq: Every day | ORAL | 1 refills | Status: DC

## 2016-10-12 MED ORDER — AMLODIPINE BESYLATE 10 MG PO TABS: ORAL_TABLET | ORAL | 2 refills | Status: DC

## 2016-10-12 MED ORDER — DOXAZOSIN MESYLATE 4 MG PO TABS: 4.0000 mg | ORAL_TABLET | Freq: Every day | ORAL | 3 refills | Status: DC

## 2016-10-12 MED ORDER — IRBESARTAN-HYDROCHLOROTHIAZIDE 300-12.5 MG PO TABS: ORAL_TABLET | ORAL | 3 refills | Status: DC

## 2016-10-12 NOTE — Telephone Encounter (Signed)
Medication called/sent to requested pharmacy and pt did not want to stop the atenolol he needed 90 day supply for it.

## 2016-10-13 ENCOUNTER — Other Ambulatory Visit: Payer: Self-pay | Admitting: Family Medicine

## 2016-10-13 MED ORDER — ATENOLOL 50 MG PO TABS: 25.0000 mg | ORAL_TABLET | Freq: Every day | ORAL | 3 refills | Status: DC

## 2016-11-01 ENCOUNTER — Other Ambulatory Visit: Payer: Self-pay | Admitting: Family Medicine

## 2017-01-09 ENCOUNTER — Other Ambulatory Visit: Payer: Self-pay | Admitting: Family Medicine

## 2017-01-09 MED ORDER — ESOMEPRAZOLE MAGNESIUM 40 MG PO CPDR: 40.0000 mg | DELAYED_RELEASE_CAPSULE | Freq: Every day | ORAL | 2 refills | Status: DC

## 2017-01-09 NOTE — Addendum Note (Signed)
Addended by: Shary Decamp B on: 01/09/2017 04:34 PM   Modules accepted: Orders

## 2017-01-10 ENCOUNTER — Other Ambulatory Visit: Payer: Self-pay | Admitting: Family Medicine

## 2017-01-10 MED ORDER — ATENOLOL 25 MG PO TABS: 25.0000 mg | ORAL_TABLET | Freq: Every day | ORAL | 3 refills | Status: DC

## 2017-02-02 DIAGNOSIS — M25562 Pain in left knee: Secondary | ICD-10-CM | POA: Diagnosis not present

## 2017-02-02 DIAGNOSIS — M1712 Unilateral primary osteoarthritis, left knee: Secondary | ICD-10-CM | POA: Diagnosis not present

## 2017-03-16 DIAGNOSIS — M25562 Pain in left knee: Secondary | ICD-10-CM | POA: Diagnosis not present

## 2017-03-22 DIAGNOSIS — G8918 Other acute postprocedural pain: Secondary | ICD-10-CM | POA: Diagnosis not present

## 2017-03-22 DIAGNOSIS — M1712 Unilateral primary osteoarthritis, left knee: Secondary | ICD-10-CM | POA: Diagnosis not present

## 2017-04-09 ENCOUNTER — Telehealth: Payer: Self-pay | Admitting: Family Medicine

## 2017-04-09 MED ORDER — LOSARTAN POTASSIUM-HCTZ 50-12.5 MG PO TABS: 1.0000 | ORAL_TABLET | Freq: Every day | ORAL | 3 refills | Status: DC

## 2017-04-09 NOTE — Telephone Encounter (Signed)
[facility] sent fax stating that the Irbesartan/ HCTZ is on back order - per Dr. [name] switch to losartan/HCTZ 100/12.5. New Rx sent to pharm.

## 2017-04-26 DIAGNOSIS — Z471 Aftercare following joint replacement surgery: Secondary | ICD-10-CM | POA: Diagnosis not present

## 2017-04-26 DIAGNOSIS — Z96652 Presence of left artificial knee joint: Secondary | ICD-10-CM | POA: Diagnosis not present

## 2017-05-11 ENCOUNTER — Other Ambulatory Visit: Payer: Self-pay | Admitting: Family Medicine

## 2017-05-11 ENCOUNTER — Telehealth: Payer: Self-pay | Admitting: *Deleted

## 2017-05-11 DIAGNOSIS — I714 Abdominal aortic aneurysm, without rupture, unspecified: Secondary | ICD-10-CM

## 2017-05-11 NOTE — Telephone Encounter (Signed)
-----   Message from Alyson Locket, [place] sent at 05/11/2017 11:44 AM EST ----- Dr. [name] said he would be happy to f/u on this.   ----- Message ----- From: Cristopher Estimable, RN Sent: 05/10/2017   3:03 PM To: Alyson Locket, RMA  Hey, we did a screening abdominal US and this patient has a small AAA and it was recommended he have a repeat study in one year. Do you think Dr [name] can follow this for the patient? We do not have plans on seeing the patient back in follow up at this time but if you prefer we continue to monitor that is not a problem just let me know what you think. Thanks the [place] is due after 06-02-17

## 2017-06-13 DIAGNOSIS — H52223 Regular astigmatism, bilateral: Secondary | ICD-10-CM | POA: Diagnosis not present

## 2017-06-13 DIAGNOSIS — Z961 Presence of intraocular lens: Secondary | ICD-10-CM | POA: Diagnosis not present

## 2017-06-13 DIAGNOSIS — H25812 Combined forms of age-related cataract, left eye: Secondary | ICD-10-CM | POA: Diagnosis not present

## 2017-06-13 DIAGNOSIS — H524 Presbyopia: Secondary | ICD-10-CM | POA: Diagnosis not present

## 2017-07-17 ENCOUNTER — Other Ambulatory Visit: Payer: Self-pay | Admitting: Family Medicine

## 2017-07-17 MED ORDER — OMEPRAZOLE 40 MG PO CPDR: 40.0000 mg | DELAYED_RELEASE_CAPSULE | Freq: Every day | ORAL | 3 refills | Status: DC

## 2017-09-13 DIAGNOSIS — Z471 Aftercare following joint replacement surgery: Secondary | ICD-10-CM | POA: Diagnosis not present

## 2017-09-13 DIAGNOSIS — M1712 Unilateral primary osteoarthritis, left knee: Secondary | ICD-10-CM | POA: Diagnosis not present

## 2017-09-13 DIAGNOSIS — Z96652 Presence of left artificial knee joint: Secondary | ICD-10-CM | POA: Diagnosis not present

## 2017-09-27 ENCOUNTER — Other Ambulatory Visit: Payer: Self-pay | Admitting: Family Medicine

## 2017-09-27 MED ORDER — PRAVASTATIN SODIUM 20 MG PO TABS: 20.0000 mg | ORAL_TABLET | Freq: Every day | ORAL | 1 refills | Status: DC

## 2018-01-18 ENCOUNTER — Other Ambulatory Visit: Payer: Self-pay | Admitting: Family Medicine

## 2018-01-18 MED ORDER — HYDROCHLOROTHIAZIDE 12.5 MG PO CAPS: 12.5000 mg | ORAL_CAPSULE | Freq: Every day | ORAL | 2 refills | Status: DC

## 2018-01-18 MED ORDER — LOSARTAN POTASSIUM 100 MG PO TABS: 50.0000 mg | ORAL_TABLET | Freq: Every day | ORAL | 3 refills | Status: DC

## 2018-03-24 ENCOUNTER — Other Ambulatory Visit: Payer: Self-pay | Admitting: Family Medicine

## 2018-04-02 ENCOUNTER — Other Ambulatory Visit: Payer: Self-pay | Admitting: Family Medicine

## 2018-04-04 IMAGING — CR DG CHEST 2V
2 series · 2 of 2 positions shown · non-contrast
Comparison: Chest x-ray of July 28, 2008

CLINICAL DATA: Orthopnea and wheezing for the past several weeks.
Former smoker. History of hypertension

EXAM:
CHEST  2 VIEW

[w chest pa]
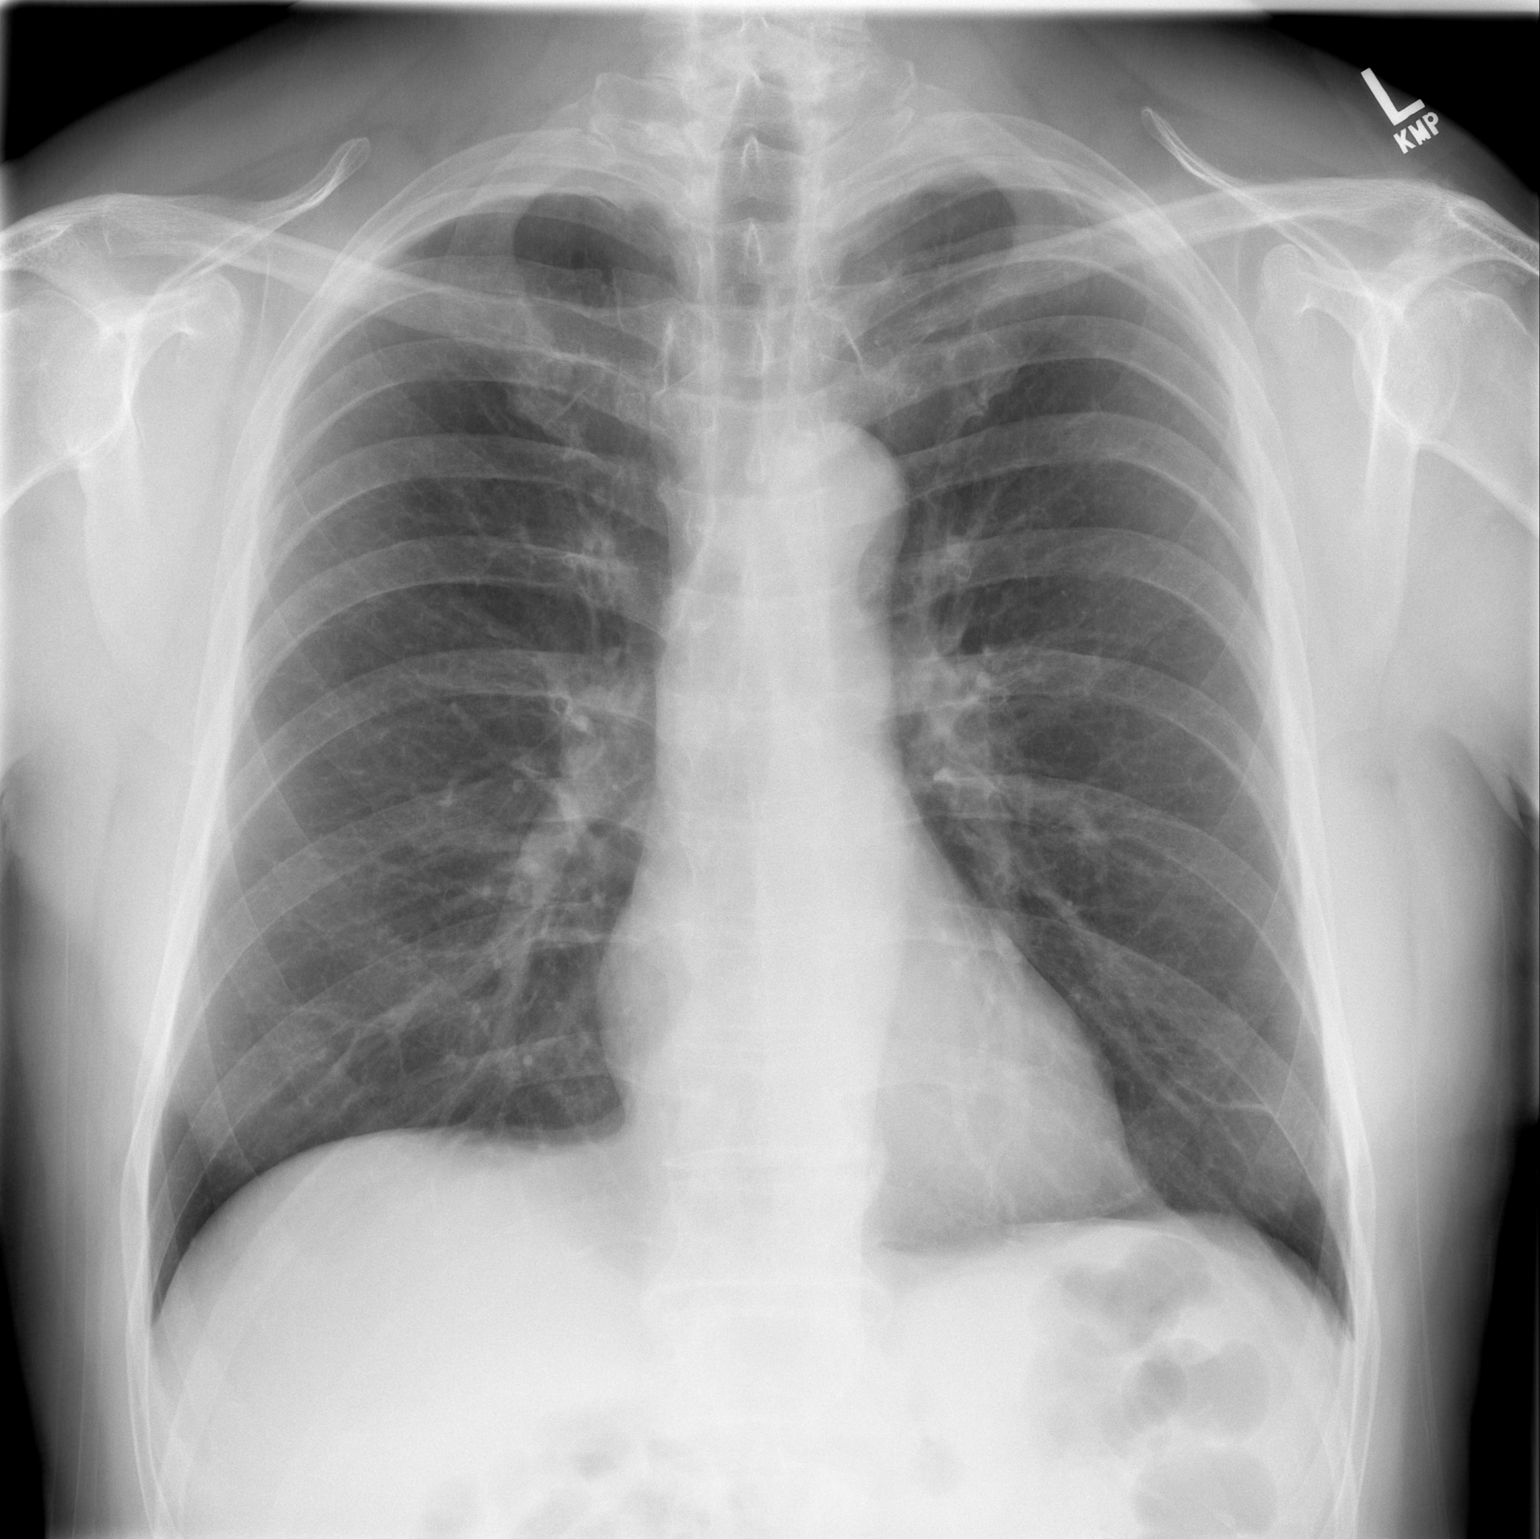

[w chest lat]
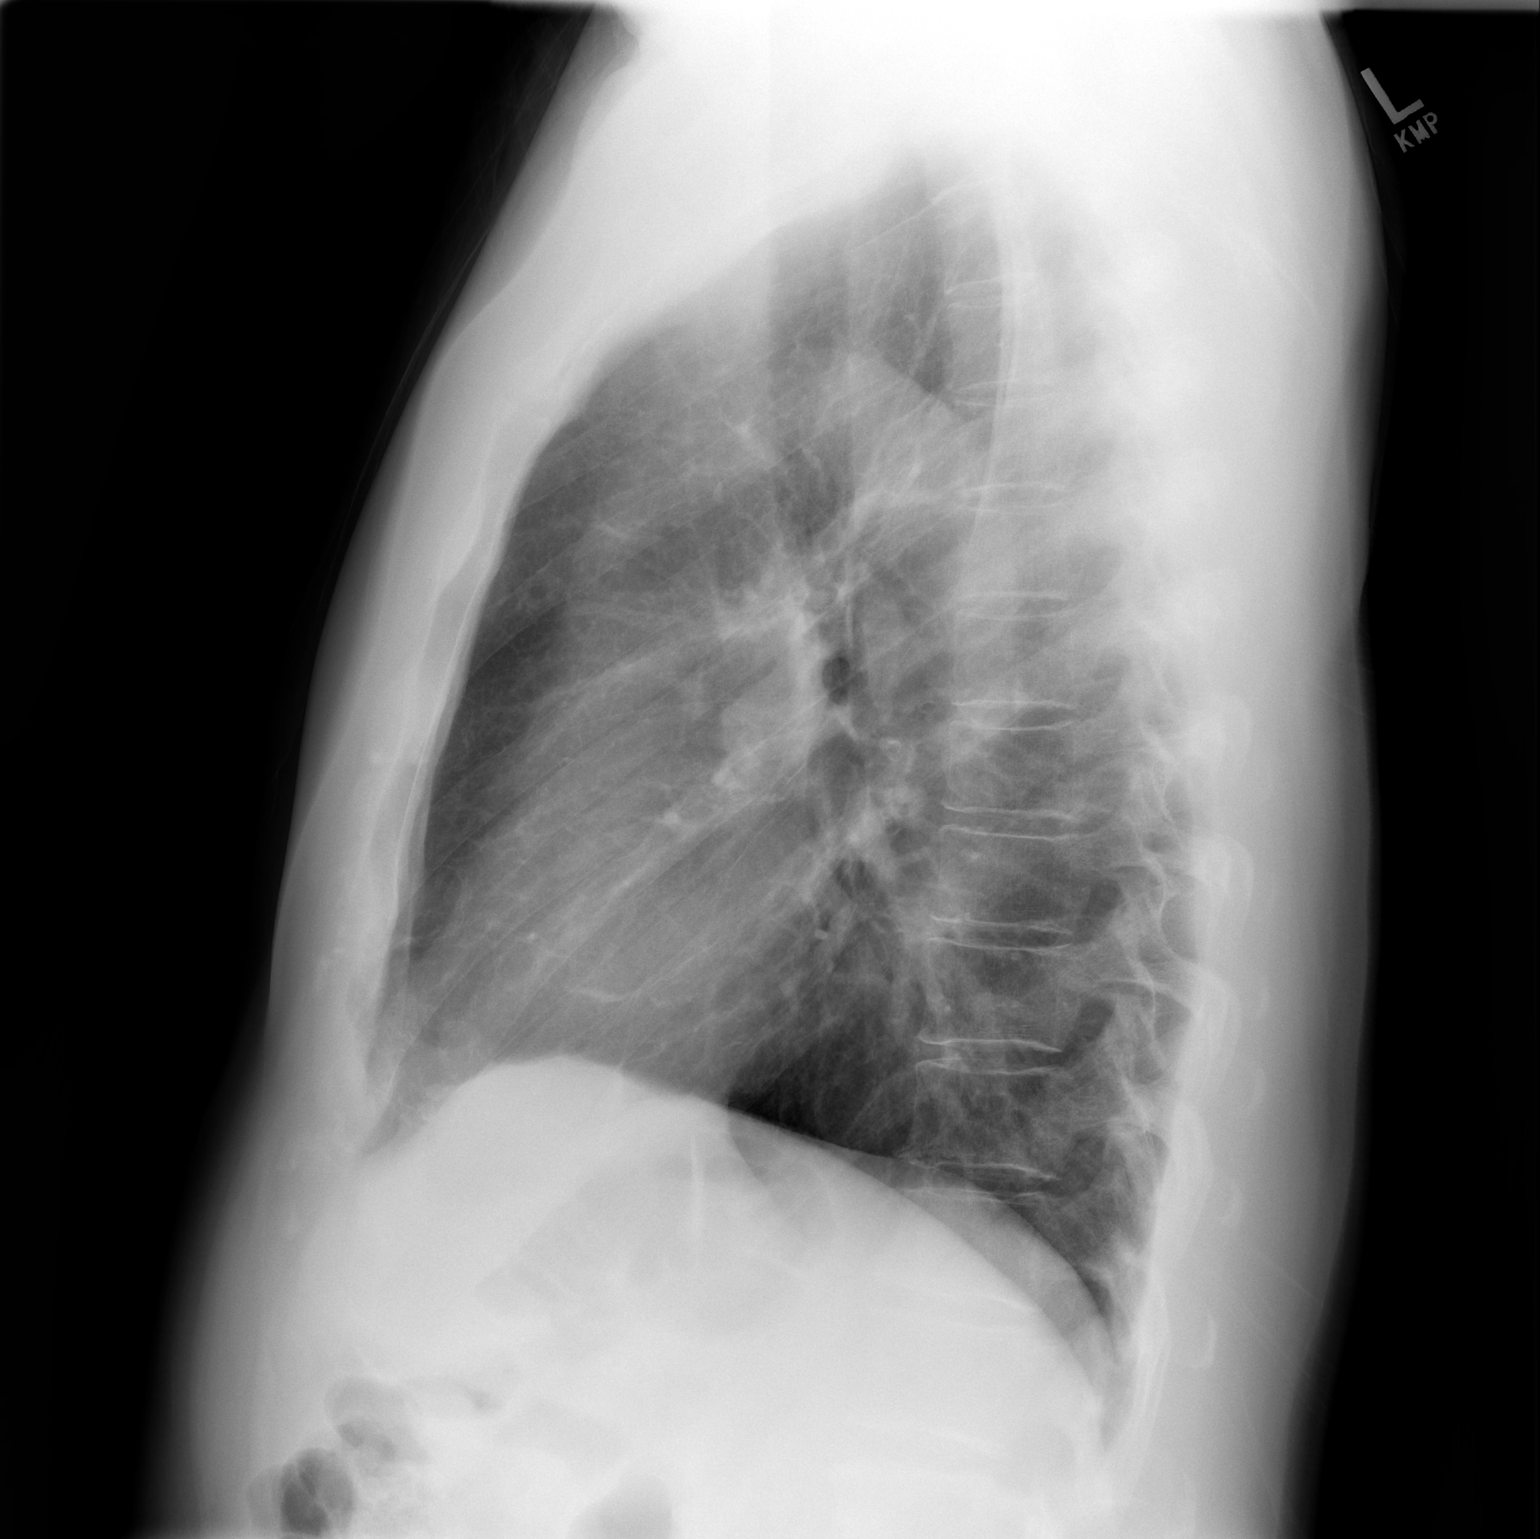

[2 of 2 positions shown; findings below may reference images not displayed]

FINDINGS: The lungs are well-expanded and clear. The heart and pulmonary
vascularity are normal. The mediastinum is normal in width. There is
no pleural effusion. The bony thorax exhibits no acute abnormality.
IMPRESSION: Mild hyperinflation consistent with reactive airway disease or COPD.
No pneumonia nor other acute cardiopulmonary abnormality.

## 2018-04-17 ENCOUNTER — Other Ambulatory Visit: Payer: Self-pay | Admitting: Family Medicine

## 2018-05-14 ENCOUNTER — Other Ambulatory Visit: Payer: Self-pay | Admitting: Family Medicine

## 2018-05-28 ENCOUNTER — Ambulatory Visit (INDEPENDENT_AMBULATORY_CARE_PROVIDER_SITE_OTHER): Payer: PPO | Admitting: Family Medicine

## 2018-05-28 ENCOUNTER — Encounter: Payer: Self-pay | Admitting: Family Medicine

## 2018-05-28 VITALS — BP 150/100 | HR 68 | Temp 97.5°F | Resp 16 | Ht 74.0 in | Wt 223.0 lb

## 2018-05-28 DIAGNOSIS — Z125 Encounter for screening for malignant neoplasm of prostate: Secondary | ICD-10-CM | POA: Diagnosis not present

## 2018-05-28 DIAGNOSIS — Z0000 Encounter for general adult medical examination without abnormal findings: Secondary | ICD-10-CM

## 2018-05-28 DIAGNOSIS — I10 Essential (primary) hypertension: Secondary | ICD-10-CM

## 2018-05-28 DIAGNOSIS — R0609 Other forms of dyspnea: Secondary | ICD-10-CM | POA: Diagnosis not present

## 2018-05-28 NOTE — Progress Notes (Signed)
Subjective:    Patient ID: Keith Pratt, male    DOB: [birth year], 68 y.o.   MRN: [id number]  HPI  07/2016 Patient had a chest x-ray showed possible COPD in November. He then saw cardiology for workup of shortness of breath. Echocardiogram revealed an ejection fraction of 60-65% with some possible diastolic dysfunction. Stress test the heart was relatively normal. He presents today reporting wheezing at night and dyspnea on exertion. He has a 50+ pack year history of smoking. He denies any hemoptysis or pleurisy or fever or productive cough. Patient states that at night he will hear a whistling or wheezing sound particularly if he lies flat on his back. At work, he will become winded very easily with minimal activity.  At that time, my plan was:  Patient's FEV1 is 75%. FEV is 4.07 L. FVC is 5.43 L. Lung volumes are greater than 100% of what is predicted. However the patient would like to try, Symbicort 160/4.5, 2 puffs twice a day for the next 2 weeks to see if this will help with his wheezing and shortness of breath. His blood pressure today is extremely high. This is out of the norm for this patient. He has a long-standing history of well-controlled high blood pressure. He feels like he is under a lot of anxiety as his partner is leaving to move to [place] to live with her daughter. He is certainly worried about this and this could be contributing some to her shortness of breath as well as his blood pressure. He will start checking his blood pressure everyday for the next 2 weeks and I will see him back. We will determine the efficacy of the Symbicort and also evaluate possible treatment for anxiety versus treatment to lower his blood pressure. He is comfortable with this plan  05/1118 Patient is here today for complete physical exam.  He reports pain with ejaculation.  However the pain is inside.  It only occurs with ejaculation.  He denies any dysuria.  He denies any urgency or frequency or  hematuria.  It occurs irregularly.  He denies any nocturia or weak stream.  Rectal exam was performed today and his prostate is enlarged although there is no nodularity.  There is no rectal masses.  He also reports gradually increasing dyspnea on exertion.  Please see the work-up above where I discussed this in the past.  He denies any chest pain.  He denies any pressure although he does occasionally have orthopnea and he finds himself having to stop more frequently.  Immunization History  Administered Date(s) Administered  . Influenza, High Dose Seasonal PF 03/01/2018  . Pneumococcal Conjugate-13 05/06/2015  . Pneumococcal Polysaccharide-23 06/06/2012  . Tdap 06/06/2012  . Zoster 06/27/2014   Patient's last colonoscopy was in 2014.  He is due for prostate exam.  Immunizations are up-to-date as listed above.  He denies any issues with depression, falls, or problems with ADLs.  Past Medical History:  Diagnosis Date  . AAA (abdominal aortic aneurysm) ([facility])    3.5 cm  . Anxiety   . Arthritis   . Cancer ([place])    basal cell CA-under left arm  . COPD (chronic obstructive pulmonary disease) ([place])   . Hyperlipidemia   . Hypertension   . Shingles    July 2014  . Thyroid disease    as a child- no problem as adult  . Ulcer    Past Surgical History:  Procedure Laterality Date  . CATARACT EXTRACTION  right eye  . COLONOSCOPY    . right arm surgery    . TONSILLECTOMY     Current Outpatient Medications on File Prior to Visit  Medication Sig Dispense Refill  . atenolol (TENORMIN) 25 MG tablet TAKE 1 TABLET BY MOUTH EVERY DAY 30 tablet 0  . hydrochlorothiazide (HYDRODIURIL) 12.5 MG tablet TAKE 1 TABLET BY MOUTH EVERY DAY WITH LOSARTAN TABLET 90 tablet 2  . ibuprofen (ADVIL,MOTRIN) 200 MG tablet Take 800 mg by mouth every 6 (six) hours as needed.    . irbesartan-hydrochlorothiazide (AVALIDE) 300-12.5 MG tablet TAKE 1 TABLET BY MOUTH DAILY IN THE MORNING AS NEEDED FOR BLOOD PRESSURE 90  tablet 3  . losartan (COZAAR) 100 MG tablet Take 0.5 tablets (50 mg total) by mouth daily. 45 tablet 3  . omeprazole (PRILOSEC) 40 MG capsule Take 1 capsule (40 mg total) by mouth daily. 90 capsule 3  . pravastatin (PRAVACHOL) 20 MG tablet TAKE 1 TABLET BY MOUTH EVERY DAY 30 tablet 0  . sildenafil (VIAGRA) 100 MG tablet TAKE 0.5-1 TABLETS (50-100 MG TOTAL) BY MOUTH DAILY AS NEEDED FOR ERECTILE DYSFUNCTION. 50 tablet 3   No current facility-administered medications on file prior to visit.    Allergies  Allergen Reactions  . Cardura [Doxazosin Mesylate] Other (See Comments)    Dizzy, hot all over - per pt "jusr felt sick"  . Codeine     REACTION: nauses and vomiting  . Amlodipine Swelling    Swelling in feet and ankles   Social History   Socioeconomic History  . Marital status: Divorced    Spouse name: Not on file  . Number of children: Not on file  . Years of education: Not on file  . Highest education level: Not on file  Occupational History  . Not on file  Social Needs  . Financial resource strain: Not on file  . Food insecurity:    Worry: Not on file    Inability: Not on file  . Transportation needs:    Medical: Not on file    Non-medical: Not on file  Tobacco Use  . Smoking status: Former Smoker    Last attempt to quit: 01/08/2003    Years since quitting: 15.3  . Smokeless tobacco: Never Used  Substance and Sexual Activity  . Alcohol use: No  . Drug use: No  . Sexual activity: Yes  Lifestyle  . Physical activity:    Days per week: Not on file    Minutes per session: Not on file  . Stress: Not on file  Relationships  . Social connections:    Talks on phone: Not on file    Gets together: Not on file    Attends religious service: Not on file    Active member of club or organization: Not on file    Attends meetings of clubs or organizations: Not on file    Relationship status: Not on file  . Intimate partner violence:    Fear of current or ex partner: Not on  file    Emotionally abused: Not on file    Physically abused: Not on file    Forced sexual activity: Not on file  Other Topics Concern  . Not on file  Social History Narrative  . Not on file      Review of Systems  All other systems reviewed and are negative.      Objective:   Physical Exam  Constitutional: He is oriented to person, place, and time. He appears  well-developed and well-nourished. No distress.  HENT:  Head: Normocephalic and atraumatic.  Right Ear: External ear normal.  Left Ear: External ear normal.  Nose: Nose normal.  Mouth/Throat: Oropharynx is clear and moist.  Eyes: Pupils are equal, round, and reactive to light. Conjunctivae and EOM are normal. Right eye exhibits no discharge. Left eye exhibits no discharge. No scleral icterus.  Neck: Neck supple. No JVD present. No tracheal deviation present. No thyromegaly present.  Cardiovascular: Normal rate, regular rhythm, normal heart sounds and intact distal pulses. Exam reveals no gallop.  No murmur heard. Pulmonary/Chest: Effort normal and breath sounds normal. No stridor. No respiratory distress. He has no wheezes. He has no rales. He exhibits no tenderness.  Abdominal: Soft. Bowel sounds are normal. He exhibits no distension and no mass. There is no abdominal tenderness. There is no rebound and no guarding.  Genitourinary:    Rectum normal.  Prostate is enlarged.  Musculoskeletal:        General: No edema.  Lymphadenopathy:    He has no cervical adenopathy.  Neurological: He is alert and oriented to person, place, and time. He has normal reflexes. He displays normal reflexes. No cranial nerve deficit. He exhibits normal muscle tone. Coordination normal.  Skin: Skin is warm. No rash noted. He is not diaphoretic. No erythema. No pallor.  Psychiatric: He has a normal mood and affect. His behavior is normal. Judgment and thought content normal.  Vitals reviewed.         Assessment & Plan:  General medical  exam - Plan: CBC with Differential/Platelet, COMPLETE METABOLIC PANEL WITH GFR, Lipid panel, PSA, ECHOCARDIOGRAM COMPLETE, DG Chest 2 View  Dyspnea on exertion - Plan: ECHOCARDIOGRAM COMPLETE, DG Chest 2 View  Prostate cancer screening - Plan: PSA  Benign essential HTN - Plan: CBC with Differential/Platelet, COMPLETE METABOLIC PANEL WITH GFR, Lipid panel, ECHOCARDIOGRAM COMPLETE Patient appears to be taking losartan, hydrochlorothiazide, and Avalide.  I asked the patient to discontinue Avalide.  I want him to replace losartan 50 mg a day with 100 mg a day and continue hydrochlorothiazide.  I will recheck his blood pressure in 2 weeks.  I will check a CBC, CMP, fasting lipid panel as well as a PSA.  Given the worsening dyspnea on exertion I will repeat echocardiogram given his age and his hypertension however I suspect worsening COPD.  Obtain a chest x-ray to rule out other issues within the lungs and if chest x-ray is normal and echocardiogram is normal, I will put the patient on triple therapy to try to improve his dyspnea on exertion.

## 2018-05-29 DIAGNOSIS — H2512 Age-related nuclear cataract, left eye: Secondary | ICD-10-CM | POA: Diagnosis not present

## 2018-05-29 DIAGNOSIS — H47321 Drusen of optic disc, right eye: Secondary | ICD-10-CM | POA: Diagnosis not present

## 2018-05-29 LAB — CBC WITH DIFFERENTIAL/PLATELET
Absolute Monocytes: 792 cells/uL (ref 200–950)
Basophils Absolute: 118 cells/uL (ref 0–200)
Basophils Relative: 1.6 %
Eosinophils Absolute: 533 cells/uL — ABNORMAL HIGH (ref 15–500)
Eosinophils Relative: 7.2 %
HCT: 40.6 % (ref 38.5–50.0)
Hemoglobin: 14 g/dL (ref 13.2–17.1)
Lymphs Abs: 2272 cells/uL (ref 850–3900)
MCH: 29.8 pg (ref 27.0–33.0)
MCHC: 34.5 g/dL (ref 32.0–36.0)
MCV: 86.4 fL (ref 80.0–100.0)
MPV: 9.8 fL (ref 7.5–12.5)
Monocytes Relative: 10.7 %
Neutro Abs: 3685 cells/uL (ref 1500–7800)
Neutrophils Relative %: 49.8 %
PLATELETS: 267 10*3/uL (ref 140–400)
RBC: 4.7 10*6/uL (ref 4.20–5.80)
RDW: 12.7 % (ref 11.0–15.0)
TOTAL LYMPHOCYTE: 30.7 %
WBC: 7.4 10*3/uL (ref 3.8–10.8)

## 2018-05-29 LAB — COMPLETE METABOLIC PANEL WITH GFR
AG Ratio: 1.6 (calc) (ref 1.0–2.5)
ALT: 30 U/L (ref 9–46)
AST: 23 U/L (ref 10–35)
Albumin: 4.2 g/dL (ref 3.6–5.1)
Alkaline phosphatase (APISO): 68 U/L (ref 35–144)
BUN/Creatinine Ratio: 11 (calc) (ref 6–22)
BUN: 13 mg/dL (ref 7–25)
CALCIUM: 9.4 mg/dL (ref 8.6–10.3)
CO2: 31 mmol/L (ref 20–32)
Chloride: 97 mmol/L — ABNORMAL LOW (ref 98–110)
Creat: 1.21 mg/dL — ABNORMAL HIGH (ref 0.70–1.18)
GFR, EST NON AFRICAN AMERICAN: 60 mL/min/{1.73_m2} (ref >=60)
GFR, Est African American: 70 mL/min/{1.73_m2} (ref >=60)
Globulin: 2.6 g/dL (calc) (ref 1.9–3.7)
Glucose, Bld: 90 mg/dL (ref 65–99)
Potassium: 4.2 mmol/L (ref 3.5–5.3)
Sodium: 136 mmol/L (ref 135–146)
Total Bilirubin: 0.5 mg/dL (ref 0.2–1.2)
Total Protein: 6.8 g/dL (ref 6.1–8.1)

## 2018-05-29 LAB — LIPID PANEL
Cholesterol: 155 mg/dL (ref <200)
HDL: 30 mg/dL — ABNORMAL LOW (ref >=40)
LDL Cholesterol (Calc): 86 mg/dL (calc)
Non-HDL Cholesterol (Calc): 125 mg/dL (calc) (ref <130)
Total CHOL/HDL Ratio: 5.2 (calc) — ABNORMAL HIGH (ref <5.0)
Triglycerides: 322 mg/dL — ABNORMAL HIGH (ref <150)

## 2018-05-29 LAB — PSA: PSA: 2.6 ng/mL (ref <=4.0)

## 2018-05-30 ENCOUNTER — Ambulatory Visit
Admission: RE | Admit: 2018-05-30 | Discharge: 2018-05-30 | Disposition: A | Discharge status: Home / Self Care | Payer: PPO | Source: Ambulatory Visit | Attending: Family Medicine | Admitting: Family Medicine

## 2018-05-30 DIAGNOSIS — Z0000 Encounter for general adult medical examination without abnormal findings: Secondary | ICD-10-CM

## 2018-05-30 DIAGNOSIS — R0609 Other forms of dyspnea: Secondary | ICD-10-CM

## 2018-05-30 DIAGNOSIS — J449 Chronic obstructive pulmonary disease, unspecified: Secondary | ICD-10-CM | POA: Diagnosis not present

## 2018-06-05 ENCOUNTER — Ambulatory Visit (HOSPITAL_COMMUNITY)
Admission: RE | Admit: 2018-06-05 | Discharge: 2018-06-05 | Disposition: A | Discharge status: Home / Self Care | Payer: PPO | Source: Ambulatory Visit | Attending: Family Medicine | Admitting: Family Medicine

## 2018-06-05 DIAGNOSIS — R0609 Other forms of dyspnea: Secondary | ICD-10-CM

## 2018-06-05 DIAGNOSIS — I358 Other nonrheumatic aortic valve disorders: Secondary | ICD-10-CM | POA: Insufficient documentation

## 2018-06-05 DIAGNOSIS — I10 Essential (primary) hypertension: Secondary | ICD-10-CM | POA: Diagnosis not present

## 2018-06-05 DIAGNOSIS — Z0000 Encounter for general adult medical examination without abnormal findings: Secondary | ICD-10-CM | POA: Insufficient documentation

## 2018-06-18 ENCOUNTER — Other Ambulatory Visit: Payer: Self-pay | Admitting: *Deleted

## 2018-06-18 MED ORDER — ATENOLOL 25 MG PO TABS: 25.0000 mg | ORAL_TABLET | Freq: Every day | ORAL | 6 refills | Status: DC

## 2018-06-28 ENCOUNTER — Other Ambulatory Visit: Payer: Self-pay | Admitting: *Deleted

## 2018-06-28 MED ORDER — PRAVASTATIN SODIUM 20 MG PO TABS: 20.0000 mg | ORAL_TABLET | Freq: Every day | ORAL | 1 refills | Status: DC

## 2018-07-01 ENCOUNTER — Telehealth: Payer: Self-pay | Admitting: Family Medicine

## 2018-07-01 MED ORDER — PRAVASTATIN SODIUM 20 MG PO TABS: 20.0000 mg | ORAL_TABLET | Freq: Every day | ORAL | 1 refills | Status: DC

## 2018-07-01 NOTE — Telephone Encounter (Signed)
Pt needs refill on pravastatin to [facility].

## 2018-07-01 NOTE — Telephone Encounter (Signed)
Medication called/sent to requested pharmacy  

## 2018-07-22 ENCOUNTER — Telehealth: Payer: Self-pay | Admitting: Family Medicine

## 2018-07-22 MED ORDER — LOSARTAN POTASSIUM 100 MG PO TABS: 100.0000 mg | ORAL_TABLET | Freq: Every day | ORAL | 3 refills | Status: DC

## 2018-07-22 NOTE — Telephone Encounter (Signed)
PATIENT CALLING TO SAY THAT RX THAT WAS CALLED IN FOR POTASSIUM IS NOT CORRECT PLEASE CALL HIM BACK AT [phone]

## 2018-07-22 NOTE — Telephone Encounter (Signed)
Pt needed 100mg  of losartan - med sent to pharm

## 2018-07-31 ENCOUNTER — Other Ambulatory Visit: Payer: Self-pay | Admitting: Family Medicine

## 2018-07-31 MED ORDER — OMEPRAZOLE 40 MG PO CPDR: 40.0000 mg | DELAYED_RELEASE_CAPSULE | Freq: Every day | ORAL | 3 refills | Status: DC

## 2018-09-03 ENCOUNTER — Other Ambulatory Visit: Payer: Self-pay | Admitting: Family Medicine

## 2018-09-03 MED ORDER — LOSARTAN POTASSIUM 100 MG PO TABS: 100.0000 mg | ORAL_TABLET | Freq: Every day | ORAL | 3 refills | Status: DC

## 2018-12-10 ENCOUNTER — Other Ambulatory Visit: Payer: Self-pay | Admitting: Family Medicine

## 2018-12-13 ENCOUNTER — Other Ambulatory Visit: Payer: Self-pay | Admitting: Family Medicine

## 2019-02-17 ENCOUNTER — Other Ambulatory Visit: Payer: Self-pay

## 2019-02-18 ENCOUNTER — Encounter: Payer: Self-pay | Admitting: Family Medicine

## 2019-02-18 ENCOUNTER — Ambulatory Visit (INDEPENDENT_AMBULATORY_CARE_PROVIDER_SITE_OTHER): Payer: PPO | Admitting: Family Medicine

## 2019-02-18 VITALS — BP 130/74 | HR 80 | Temp 97.1°F | Resp 18 | Ht 74.0 in | Wt 213.0 lb

## 2019-02-18 DIAGNOSIS — L821 Other seborrheic keratosis: Secondary | ICD-10-CM

## 2019-02-18 NOTE — Progress Notes (Signed)
Subjective:    Patient ID: Keith Pratt, male    DOB: March 17, 1951, 68 y.o.   MRN: [id number]  HPI  Patient has numerous skin tags and seborrheic keratoses on his body that bother him.  Specifically there are 4 lesions that he would like removed.  Each of these are seborrheic keratoses there are 1 lesion in his right axilla which is 4 mm in diameter.  This is a brown warty papule.  There is one lesion in his left axilla that is also a brown warty papule that is 4 mm in diameter.  There is 1 lesion on his lower abdomen just above and to the right of his umbilicus.  This is a 5 mm warty papule with small capillary hemorrhages.  Finally, there is one lesion on his right hamstring.  This is elliptical in shape and is 1 cm x 6 mm.  It is also a warty plaque with numerous small pinpoint capillary hemorrhages and white scale.  I believe all 4 of these lesions are seborrheic keratoses.  He would like them removed   Past Medical History:  Diagnosis Date  . AAA (abdominal aortic aneurysm) (HCC)    3.5 cm  . Anxiety   . Arthritis   . Cancer ([place])    basal cell CA-under left arm  . COPD (chronic obstructive pulmonary disease) ([place])   . Hyperlipidemia   . Hypertension   . Shingles    July 2014  . Thyroid disease    as a child- no problem as adult  . Ulcer    Past Surgical History:  Procedure Laterality Date  . CATARACT EXTRACTION     right eye  . COLONOSCOPY    . right arm surgery    . TONSILLECTOMY     Current Outpatient Medications on File Prior to Visit  Medication Sig Dispense Refill  . atenolol (TENORMIN) 25 MG tablet TAKE 1 TABLET BY MOUTH EVERY DAY 90 tablet 2  . hydrochlorothiazide (HYDRODIURIL) 12.5 MG tablet TAKE 1 TABLET BY MOUTH EVERY DAY WITH LOSARTAN TABLET 90 tablet 2  . ibuprofen (ADVIL,MOTRIN) 200 MG tablet Take 800 mg by mouth every 6 (six) hours as needed.    . irbesartan-hydrochlorothiazide (AVALIDE) 300-12.5 MG tablet TAKE 1 TABLET BY MOUTH DAILY IN THE MORNING AS  NEEDED FOR BLOOD PRESSURE 90 tablet 3  . losartan (COZAAR) 100 MG tablet Take 1 tablet (100 mg total) by mouth daily. 90 tablet 3  . omeprazole (PRILOSEC) 40 MG capsule Take 1 capsule (40 mg total) by mouth daily. 90 capsule 3  . pravastatin (PRAVACHOL) 20 MG tablet TAKE 1 TABLET BY MOUTH EVERY DAY 90 tablet 1  . sildenafil (VIAGRA) 100 MG tablet TAKE 0.5-1 TABLETS (50-100 MG TOTAL) BY MOUTH DAILY AS NEEDED FOR ERECTILE DYSFUNCTION. 50 tablet 3   No current facility-administered medications on file prior to visit.    Allergies  Allergen Reactions  . Cardura [Doxazosin Mesylate] Other (See Comments)    Dizzy, hot all over - per pt "jusr felt sick"  . Codeine     REACTION: nauses and vomiting  . Amlodipine Swelling    Swelling in feet and ankles   Social History   Socioeconomic History  . Marital status: Divorced    Spouse name: Not on file  . Number of children: Not on file  . Years of education: Not on file  . Highest education level: Not on file  Occupational History  . Not on file  Social Needs  .  Financial resource strain: Not on file  . Food insecurity    Worry: Not on file    Inability: Not on file  . Transportation needs    Medical: Not on file    Non-medical: Not on file  Tobacco Use  . Smoking status: Former Smoker    Quit date: 01/08/2003    Years since quitting: 16.1  . Smokeless tobacco: Never Used  Substance and Sexual Activity  . Alcohol use: No  . Drug use: No  . Sexual activity: Yes  Lifestyle  . Physical activity    Days per week: Not on file    Minutes per session: Not on file  . Stress: Not on file  Relationships  . Social Herbalist on phone: Not on file    Gets together: Not on file    Attends religious service: Not on file    Active member of club or organization: Not on file    Attends meetings of clubs or organizations: Not on file    Relationship status: Not on file  . Intimate partner violence    Fear of current or ex  partner: Not on file    Emotionally abused: Not on file    Physically abused: Not on file    Forced sexual activity: Not on file  Other Topics Concern  . Not on file  Social History Narrative  . Not on file      Review of Systems  All other systems reviewed and are negative.      Objective:   Physical Exam Vitals signs reviewed.  Constitutional:      Appearance: Normal appearance.  Cardiovascular:     Rate and Rhythm: Normal rate and regular rhythm.     Heart sounds: Normal heart sounds.  Pulmonary:     Effort: Pulmonary effort is normal.     Breath sounds: Normal breath sounds.  Skin:      Neurological:     Mental Status: He is alert.           Assessment & Plan:  Seborrheic keratoses  Each of the lesions was anesthetized using 0.1% lidocaine with epinephrine.  Each lesion was then removed using sterile technique with a razor blade similar to a shave biopsy down to the underlying dermis.  Hemostasis was achieved with each lesion using Drysol, Neosporin, and a Band-Aid.  None of the lesions were sent to pathology given their benign appearance.

## 2019-07-16 ENCOUNTER — Other Ambulatory Visit: Payer: Self-pay | Admitting: Family Medicine

## 2019-08-22 ENCOUNTER — Other Ambulatory Visit: Payer: Self-pay | Admitting: Family Medicine

## 2019-09-01 ENCOUNTER — Other Ambulatory Visit: Payer: Self-pay | Admitting: Family Medicine

## 2019-09-23 DIAGNOSIS — H47321 Drusen of optic disc, right eye: Secondary | ICD-10-CM | POA: Diagnosis not present

## 2019-09-23 DIAGNOSIS — H2512 Age-related nuclear cataract, left eye: Secondary | ICD-10-CM | POA: Diagnosis not present

## 2019-09-23 DIAGNOSIS — H26491 Other secondary cataract, right eye: Secondary | ICD-10-CM | POA: Diagnosis not present

## 2019-09-29 ENCOUNTER — Other Ambulatory Visit: Payer: Self-pay | Admitting: Family Medicine

## 2019-10-09 DIAGNOSIS — H47321 Drusen of optic disc, right eye: Secondary | ICD-10-CM | POA: Diagnosis not present

## 2019-10-09 DIAGNOSIS — H26491 Other secondary cataract, right eye: Secondary | ICD-10-CM | POA: Diagnosis not present

## 2019-10-09 DIAGNOSIS — H2512 Age-related nuclear cataract, left eye: Secondary | ICD-10-CM | POA: Diagnosis not present

## 2019-10-23 ENCOUNTER — Other Ambulatory Visit: Payer: Self-pay

## 2019-10-23 ENCOUNTER — Ambulatory Visit (INDEPENDENT_AMBULATORY_CARE_PROVIDER_SITE_OTHER): Payer: PPO | Admitting: Family Medicine

## 2019-10-23 VITALS — BP 144/86 | HR 71 | Temp 97.8°F | Wt 214.0 lb

## 2019-10-23 DIAGNOSIS — N5312 Painful ejaculation: Secondary | ICD-10-CM

## 2019-10-23 DIAGNOSIS — N4281 Prostatodynia syndrome: Secondary | ICD-10-CM

## 2019-10-23 LAB — URINALYSIS, ROUTINE W REFLEX MICROSCOPIC
Bilirubin Urine: NEGATIVE
Glucose, UA: NEGATIVE
Hgb urine dipstick: NEGATIVE
Ketones, ur: NEGATIVE
Leukocytes,Ua: NEGATIVE
Nitrite: NEGATIVE
Protein, ur: NEGATIVE
Specific Gravity, Urine: 1.02 (ref 1.001–1.03)
pH: 7 (ref 5.0–8.0)

## 2019-10-23 LAB — CBC WITH DIFFERENTIAL/PLATELET
Absolute Monocytes: 672 cells/uL (ref 200–950)
Basophils Absolute: 111 cells/uL (ref 0–200)
Basophils Relative: 1.4 %
Eosinophils Absolute: 632 cells/uL — ABNORMAL HIGH (ref 15–500)
Eosinophils Relative: 8 %
HCT: 40.7 % (ref 38.5–50.0)
Hemoglobin: 14.2 g/dL (ref 13.2–17.1)
Lymphs Abs: 2884 cells/uL (ref 850–3900)
MCH: 30.5 pg (ref 27.0–33.0)
MCHC: 34.9 g/dL (ref 32.0–36.0)
MCV: 87.3 fL (ref 80.0–100.0)
MPV: 10.5 fL (ref 7.5–12.5)
Monocytes Relative: 8.5 %
Neutro Abs: 3602 cells/uL (ref 1500–7800)
Neutrophils Relative %: 45.6 %
Platelets: 253 10*3/uL (ref 140–400)
RBC: 4.66 10*6/uL (ref 4.20–5.80)
RDW: 12.3 % (ref 11.0–15.0)
Total Lymphocyte: 36.5 %
WBC: 7.9 10*3/uL (ref 3.8–10.8)

## 2019-10-23 LAB — BASIC METABOLIC PANEL WITH GFR
BUN: 14 mg/dL (ref 7–25)
CO2: 29 mmol/L (ref 20–32)
Calcium: 9.5 mg/dL (ref 8.6–10.3)
Chloride: 98 mmol/L (ref 98–110)
Creat: 1.18 mg/dL (ref 0.70–1.18)
GFR, Est African American: 72 mL/min/{1.73_m2} (ref >=60)
GFR, Est Non African American: 62 mL/min/{1.73_m2} (ref >=60)
Glucose, Bld: 120 mg/dL — ABNORMAL HIGH (ref 65–99)
Potassium: 3.6 mmol/L (ref 3.5–5.3)
Sodium: 137 mmol/L (ref 135–146)

## 2019-10-23 LAB — PSA: PSA: 3.5 ng/mL (ref <=4.0)

## 2019-10-23 MED ORDER — CIPROFLOXACIN HCL 500 MG PO TABS: 500.0000 mg | ORAL_TABLET | Freq: Two times a day (BID) | ORAL | 0 refills | Status: DC

## 2019-10-23 NOTE — Progress Notes (Signed)
Subjective:    Patient ID: Keith Pratt, male    DOB: 01/27/1951, 69 y.o.   MRN: [id number]  HPI Patient states that for the last year he has had occasional episodes of painful ejaculation.  However over the last few weeks he has become constant.  It occurs every time he ejaculates.  He will develop intense pain in his lower back and in his rectum.  He describes it as a vice-like sensation in his lower back.  The pain is excruciating.  He denies any dysuria.  He denies any frequency or urgency.  He denies any hematuria.  He denies any pain or masses in his testicles.  He denies any pain in the head of his penis.  He denies any fever or chills.  He denies any pain with defecation.  On prostate exam today the prostate is diffusely swollen and mildly tender to palpation.  Urinalysis today is completely normal.  Testicular exam is normal with no hernia and no epididymal pain and no testicular masses.  Penile exam is normal. Past Medical History:  Diagnosis Date  . AAA (abdominal aortic aneurysm) (HCC)    3.5 cm  . Anxiety   . Arthritis   . Cancer ([place])    basal cell CA-under left arm  . COPD (chronic obstructive pulmonary disease) ([place])   . Hyperlipidemia   . Hypertension   . Shingles    July 2014  . Thyroid disease    as a child- no problem as adult  . Ulcer    Past Surgical History:  Procedure Laterality Date  . CATARACT EXTRACTION     right eye  . COLONOSCOPY    . right arm surgery    . TONSILLECTOMY     Current Outpatient Medications on File Prior to Visit  Medication Sig Dispense Refill  . atenolol (TENORMIN) 25 MG tablet TAKE 1 TABLET BY MOUTH EVERY DAY 90 tablet 2  . hydrochlorothiazide (HYDRODIURIL) 12.5 MG tablet TAKE 1 TABLET BY MOUTH EVERY DAY WITH LOSARTAN TABLET 90 tablet 2  . ibuprofen (ADVIL,MOTRIN) 200 MG tablet Take 800 mg by mouth every 6 (six) hours as needed.    . irbesartan-hydrochlorothiazide (AVALIDE) 300-12.5 MG tablet TAKE 1 TABLET BY MOUTH DAILY IN  THE MORNING AS NEEDED FOR BLOOD PRESSURE 90 tablet 3  . losartan (COZAAR) 100 MG tablet TAKE 1 TABLET BY MOUTH EVERY DAY 90 tablet 1  . omeprazole (PRILOSEC) 40 MG capsule TAKE 1 CAPSULE BY MOUTH EVERY DAY 90 capsule 3  . pravastatin (PRAVACHOL) 20 MG tablet TAKE 1 TABLET BY MOUTH EVERY DAY NEEDS APPT FOR REFILLS 90 tablet 1  . sildenafil (REVATIO) 20 MG tablet TAKE 2 TO 4 TABLETS BY MOUTH AS NEEDED FOR SEXUAL ACTIVITY 100 tablet 2  . sildenafil (VIAGRA) 100 MG tablet TAKE 0.5-1 TABLETS (50-100 MG TOTAL) BY MOUTH DAILY AS NEEDED FOR ERECTILE DYSFUNCTION. 50 tablet 3   No current facility-administered medications on file prior to visit.   Allergies  Allergen Reactions  . Cardura [Doxazosin Mesylate] Other (See Comments)    Dizzy, hot all over - per pt "jusr felt sick"  . Codeine     REACTION: nauses and vomiting  . Amlodipine Swelling    Swelling in feet and ankles   Social History   Socioeconomic History  . Marital status: Divorced    Spouse name: Not on file  . Number of children: Not on file  . Years of education: Not on file  . Highest education level:  Not on file  Occupational History  . Not on file  Tobacco Use  . Smoking status: Former Smoker    Quit date: 01/08/2003    Years since quitting: 16.8  . Smokeless tobacco: Never Used  Substance and Sexual Activity  . Alcohol use: No  . Drug use: No  . Sexual activity: Yes  Other Topics Concern  . Not on file  Social History Narrative  . Not on file   Social Determinants of Health   Financial Resource Strain:   . Difficulty of Paying Living Expenses:   Food Insecurity:   . Worried About Charity fundraiser in the Last Year:   . Arboriculturist in the Last Year:   Transportation Needs:   . Film/video editor (Medical):   Marland Kitchen Lack of Transportation (Non-Medical):   Physical Activity:   . Days of Exercise per Week:   . Minutes of Exercise per Session:   Stress:   . Feeling of Stress :   Social Connections:   .  Frequency of Communication with Friends and Family:   . Frequency of Social Gatherings with Friends and Family:   . Attends Religious Services:   . Active Member of Clubs or Organizations:   . Attends Archivist Meetings:   Marland Kitchen Marital Status:   Intimate Partner Violence:   . Fear of Current or Ex-Partner:   . Emotionally Abused:   Marland Kitchen Physically Abused:   . Sexually Abused:       Review of Systems  All other systems reviewed and are negative.      Objective:   Physical Exam Vitals reviewed.  Constitutional:      General: He is not in acute distress.    Appearance: He is well-developed. He is not diaphoretic.  HENT:     Head: Normocephalic and atraumatic.     Right Ear: External ear normal.     Left Ear: External ear normal.     Nose: Nose normal.  Eyes:     General: No scleral icterus.       Right eye: No discharge.        Left eye: No discharge.     Conjunctiva/sclera: Conjunctivae normal.     Pupils: Pupils are equal, round, and reactive to light.  Neck:     Thyroid: No thyromegaly.     Vascular: No JVD.     Trachea: No tracheal deviation.  Cardiovascular:     Rate and Rhythm: Normal rate and regular rhythm.     Heart sounds: Normal heart sounds. No murmur heard.  No gallop.   Pulmonary:     Effort: Pulmonary effort is normal. No respiratory distress.     Breath sounds: Normal breath sounds. No stridor. No wheezing or rales.  Chest:     Chest wall: No tenderness.  Abdominal:     General: Bowel sounds are normal. There is no distension.     Palpations: Abdomen is soft. There is no mass.     Tenderness: There is no abdominal tenderness. There is no guarding or rebound.     Hernia: There is no hernia in the left inguinal area or right inguinal area.  Genitourinary:    Penis: Normal and circumcised. No phimosis, hypospadias, tenderness, discharge, swelling or lesions.      Testes: Normal.        Right: Mass or tenderness not present.        Left: Mass  or tenderness not present.  Epididymis:     Right: Normal. Not inflamed or enlarged.     Left: Normal. Not inflamed or enlarged.     Prostate: Enlarged and tender. No nodules present.     Rectum: Normal.  Musculoskeletal:     Cervical back: Neck supple.  Lymphadenopathy:     Cervical: No cervical adenopathy.     Lower Body: No right inguinal adenopathy. No left inguinal adenopathy.  Skin:    General: Skin is warm.     Coloration: Skin is not pale.     Findings: No erythema or rash.  Neurological:     Mental Status: He is alert and oriented to person, place, and time.     Cranial Nerves: No cranial nerve deficit.     Motor: No abnormal muscle tone.     Coordination: Coordination normal.     Deep Tendon Reflexes: Reflexes are normal and symmetric. Reflexes normal.  Psychiatric:        Behavior: Behavior normal.        Thought Content: Thought content normal.        Judgment: Judgment normal.           Assessment & Plan:  Prostadynia - Plan: Urinalysis, Routine w reflex microscopic, PSA, CBC with Differential/Platelet, BASIC METABOLIC PANEL WITH GFR  Dyspareunia, male Suspect prostatitis.  Treat the patient empirically with Cipro 500 mg p.o. twice daily for 10 days and then reassess to see if symptoms persist or if they have improved.  Check PSA.  Urinalysis today is normal and physical exam is otherwise normal.  If antibiotics do not help with his symptoms, I would recommend urology consultation for possible cystoscopy.  Given the lack of dysuria, STD seems unlikely

## 2019-10-27 ENCOUNTER — Telehealth: Payer: Self-pay | Admitting: Family Medicine

## 2019-10-27 NOTE — Progress Notes (Signed)
  Chronic Care Management   Note  10/27/2019 Name: Keith Pratt MRN: [id number] DOB: [birth year]  Keith Pratt is a 69 y.o. year old male who is a primary care patient of Susy Frizzle, MD. I reached out to [name] by phone today in response to a referral sent by Keith Pratt's PCP, Susy Frizzle, MD.   Keith Pratt was given information about Chronic Care Management services today including:  1. CCM service includes personalized support from designated clinical staff supervised by his physician, including individualized plan of care and coordination with other care providers 2. 24/7 contact phone numbers for assistance for urgent and routine care needs. 3. Service will only be billed when office clinical staff spend 20 minutes or more in a month to coordinate care. 4. Only one practitioner may furnish and bill the service in a calendar month. 5. The patient may stop CCM services at any time (effective at the end of the month) by phone call to the office staff.   Patient agreed to services and verbal consent obtained.   Follow up plan:   [facility] [facility]

## 2019-11-06 DIAGNOSIS — H47321 Drusen of optic disc, right eye: Secondary | ICD-10-CM | POA: Diagnosis not present

## 2019-11-06 DIAGNOSIS — H2512 Age-related nuclear cataract, left eye: Secondary | ICD-10-CM | POA: Diagnosis not present

## 2019-11-06 DIAGNOSIS — H26491 Other secondary cataract, right eye: Secondary | ICD-10-CM | POA: Diagnosis not present

## 2019-11-11 ENCOUNTER — Other Ambulatory Visit: Payer: Self-pay

## 2019-12-01 DIAGNOSIS — H2512 Age-related nuclear cataract, left eye: Secondary | ICD-10-CM | POA: Diagnosis not present

## 2019-12-01 DIAGNOSIS — H2513 Age-related nuclear cataract, bilateral: Secondary | ICD-10-CM | POA: Diagnosis not present

## 2019-12-02 DIAGNOSIS — H26491 Other secondary cataract, right eye: Secondary | ICD-10-CM | POA: Diagnosis not present

## 2019-12-02 DIAGNOSIS — H47321 Drusen of optic disc, right eye: Secondary | ICD-10-CM | POA: Diagnosis not present

## 2019-12-02 DIAGNOSIS — Z961 Presence of intraocular lens: Secondary | ICD-10-CM | POA: Diagnosis not present

## 2019-12-17 DIAGNOSIS — H47321 Drusen of optic disc, right eye: Secondary | ICD-10-CM | POA: Diagnosis not present

## 2019-12-17 DIAGNOSIS — H26491 Other secondary cataract, right eye: Secondary | ICD-10-CM | POA: Diagnosis not present

## 2019-12-17 DIAGNOSIS — Z961 Presence of intraocular lens: Secondary | ICD-10-CM | POA: Diagnosis not present

## 2019-12-23 ENCOUNTER — Telehealth: Payer: Self-pay | Admitting: Pharmacist

## 2019-12-23 ENCOUNTER — Other Ambulatory Visit: Payer: Self-pay | Admitting: *Deleted

## 2019-12-23 DIAGNOSIS — N5312 Painful ejaculation: Secondary | ICD-10-CM

## 2019-12-23 DIAGNOSIS — I10 Essential (primary) hypertension: Secondary | ICD-10-CM

## 2019-12-23 DIAGNOSIS — N4281 Prostatodynia syndrome: Secondary | ICD-10-CM

## 2019-12-23 DIAGNOSIS — R0609 Other forms of dyspnea: Secondary | ICD-10-CM

## 2019-12-23 NOTE — Progress Notes (Signed)
° ° °  Chronic Care Management Pharmacy Assistant   Name: Keith Pratt  MRN: [id number] DOB: 05/27/1950  Reason for Encounter: Disease State  Patient Questions:  1.  Have you seen any other providers since your last visit? No  2.  Any changes in your medicines or health? No   Keith Pratt,  69 y.o. , male presents for their Initial CCM visit with the clinical pharmacist via telephone.  PCP : Keith Frizzle, MD  Allergies:   Allergies  Allergen Reactions   Cardura [Doxazosin Mesylate] Other (See Comments)    Dizzy, hot all over - per pt "jusr felt sick"   Codeine     REACTION: nauses and vomiting   Amlodipine Swelling    Swelling in feet and ankles    Medications: Outpatient Encounter Medications as of 12/23/2019  Medication Sig   atenolol (TENORMIN) 25 MG tablet TAKE 1 TABLET BY MOUTH EVERY DAY   ciprofloxacin (CIPRO) 500 MG tablet Take 1 tablet (500 mg total) by mouth 2 (two) times daily.   hydrochlorothiazide (HYDRODIURIL) 12.5 MG tablet TAKE 1 TABLET BY MOUTH EVERY DAY WITH LOSARTAN TABLET   ibuprofen (ADVIL,MOTRIN) 200 MG tablet Take 800 mg by mouth every 6 (six) hours as needed.   irbesartan-hydrochlorothiazide (AVALIDE) 300-12.5 MG tablet TAKE 1 TABLET BY MOUTH DAILY IN THE MORNING AS NEEDED FOR BLOOD PRESSURE   losartan (COZAAR) 100 MG tablet TAKE 1 TABLET BY MOUTH EVERY DAY   omeprazole (PRILOSEC) 40 MG capsule TAKE 1 CAPSULE BY MOUTH EVERY DAY   pravastatin (PRAVACHOL) 20 MG tablet TAKE 1 TABLET BY MOUTH EVERY DAY NEEDS APPT FOR REFILLS   sildenafil (REVATIO) 20 MG tablet TAKE 2 TO 4 TABLETS BY MOUTH AS NEEDED FOR SEXUAL ACTIVITY   sildenafil (VIAGRA) 100 MG tablet TAKE 0.5-1 TABLETS (50-100 MG TOTAL) BY MOUTH DAILY AS NEEDED FOR ERECTILE DYSFUNCTION.   No facility-administered encounter medications on file as of 12/23/2019.    Current Diagnosis: Patient Active Problem List   Diagnosis Date Noted   Right knee pain 10/08/2013   Hyperlipidemia     Hypertension     Goals Addressed   None     Follow-Up:  [facility]   Have you seen any other providers since your last visit? **no Any changes in your medications or health? no Do you have an symptoms or problems not managed by your medications? no Any concerns about your health right now? no Do you have any problems getting your medications? no Is there anything that you would like to discuss during the appointment? No current concerns.  Please bring medications and supplements to appointment.

## 2019-12-23 NOTE — Chronic Care Management (AMB) (Addendum)
Chronic Care Management Pharmacy  Name: Keith Pratt  MRN: [id number] DOB: 08-13-1950  Chief Complaint/ HPI  Keith Pratt,  69 y.o. , male presents for their Initial CCM visit with the clinical pharmacist In office.  PCP : Keith Frizzle, MD  Their chronic conditions include: HTN,HLD.  Office Visits: 10/23/2019 [name]) -  Patient is experiencing painful ejaculation Suspected prostatitis, treated with Cipro x 10 days   Consult Visit: none recent available    Medications: Outpatient Encounter Medications as of 12/24/2019  Medication Sig   atenolol (TENORMIN) 25 MG tablet TAKE 1 TABLET BY MOUTH EVERY DAY   hydrochlorothiazide (HYDRODIURIL) 12.5 MG tablet TAKE 1 TABLET BY MOUTH EVERY DAY WITH LOSARTAN TABLET   losartan (COZAAR) 100 MG tablet TAKE 1 TABLET BY MOUTH EVERY DAY   omeprazole (PRILOSEC) 40 MG capsule TAKE 1 CAPSULE BY MOUTH EVERY DAY   pravastatin (PRAVACHOL) 20 MG tablet TAKE 1 TABLET BY MOUTH EVERY DAY NEEDS APPT FOR REFILLS   sildenafil (REVATIO) 20 MG tablet TAKE 2 TO 4 TABLETS BY MOUTH AS NEEDED FOR SEXUAL ACTIVITY   sildenafil (VIAGRA) 100 MG tablet TAKE 0.5-1 TABLETS (50-100 MG TOTAL) BY MOUTH DAILY AS NEEDED FOR ERECTILE DYSFUNCTION.   ciprofloxacin (CIPRO) 500 MG tablet Take 1 tablet (500 mg total) by mouth 2 (two) times daily. (Patient not taking: Reported on 12/24/2019)   ibuprofen (ADVIL,MOTRIN) 200 MG tablet Take 800 mg by mouth every 6 (six) hours as needed. (Patient not taking: Reported on 12/24/2019)   irbesartan-hydrochlorothiazide (AVALIDE) 300-12.5 MG tablet TAKE 1 TABLET BY MOUTH DAILY IN THE MORNING AS NEEDED FOR BLOOD PRESSURE (Patient not taking: Reported on 12/24/2019)   No facility-administered encounter medications on file as of 12/24/2019.     Current Diagnosis/Assessment:  Goals Addressed             This Visit's Progress    Pharmacy Care Plan:       CARE PLAN ENTRY (see longitudinal plan of care for additional care plan  information)  Current Barriers:  Chronic Disease Management support, education, and care coordination needs related to Hypertension and Hyperlipidemia   Hypertension BP Readings from Last 3 Encounters:  10/23/19 (!) 144/86  02/18/19 130/74  05/28/18 (!) 150/100   Pharmacist Clinical Goal(s): Over the next 180 days, patient will work with PharmD and providers to achieve BP goal <140/90 Current regimen:  Atenolol 41m HCTZ 252mLosartan 10052mnterventions: Reviewed office BP Discussed medication adherence Discussed diet low in sodium Patient self care activities - Over the next 180 days, patient will: Check BP periodically, document, and provide at future appointments Ensure daily salt intake < 2300 mg/day  Hyperlipidemia Lab Results  Component Value Date/Time   LDLCALC 86 05/28/2018 10:06 AM   Pharmacist Clinical Goal(s): Over the next 180 days, patient will work with PharmD and providers to maintain LDL goal < 100 Current regimen:  Pravastatin 24m23mterventions: Reviewed most recent lipid panel Patient self care activities - Over the next 180 days, patient will: Continue to focus on medication adherence by using pill box Work on diet low in saturated fats and sodium.   Initial goal documentation         Hypertension   BP goal is:  <140/90  Office blood pressures are  BP Readings from Last 3 Encounters:  10/23/19 (!) 144/86  02/18/19 130/74  05/28/18 (!) 150/100   Patient checks BP at home infrequently Patient home BP readings are ranging: no logs available  Patient has  failed these meds in the past: amlodipine (leg swelling) Patient is currently uncontrolled on the following medications:  Atenolol 15m HCTZ 278mLosartan 10081mHe remains very active doing yardwork, playing golf, etc.  Does not check blood pressure at home.  Office BP has been up and down.  Reported he recently was undergoing anesthesia and the doctor mentioned something to him  about his heart "skipping a beat" and almost delayed surgery.  Recommended he follow up with PCP and or cardiologist on this ASAP.  Denies swelling, dizziness, headaches.  Adherent to medications.  Plan  Continue current medications     Hyperlipidemia   LDL goal < 100  Lipid Panel     Component Value Date/Time   CHOL 155 05/28/2018 1006   TRIG 322 (H) 05/28/2018 1006   HDL 30 (L) 05/28/2018 1006   LDLCALC 86 05/28/2018 1006    Hepatic Function Latest Ref Rng & Units 05/28/2018 05/07/2015 10/06/2014  Total Protein 6.1 - 8.1 g/dL 6.8 6.8 6.8  Albumin 3.6 - 5.1 g/dL - 4.1 3.9  AST 10 - 35 U/L 23 21 21   ALT 9 - 46 U/L 30 22 17   Alk Phosphatase 40 - 115 U/L - 82 78  Total Bilirubin 0.2 - 1.2 mg/dL 0.5 0.6 0.4  Bilirubin, Direct 0.0 - 0.3 mg/dL - - -     The 10-year ASCVD risk score (GoMikey Bussing Jr., et al., 2013) is: 29.2%   Values used to calculate the score:     Age: [age]     Sex: Male     Is Non-Hispanic African American: No     Diabetic: No     Tobacco smoker: No     Systolic Blood Pressure: 144657Hg     Is BP treated: Yes     HDL Cholesterol: 30 mg/dL     Total Cholesterol: 155 mg/dL   Patient has failed these meds in past: none noted Patient is currently controlled based on LDL on the following medications:  Pravastatin 24m32matient reports adherence and denies myalgias.  LDL controlled at last check.  Patient due for updated lipid panel  Plan  Continue current medications Vaccines   Reviewed and discussed patient's vaccination history.    Immunization History  Administered Date(s) Administered   Influenza, High Dose Seasonal PF 03/01/2018   Pneumococcal Conjugate-13 05/06/2015   Pneumococcal Polysaccharide-23 06/06/2012   Tdap 06/06/2012   Zoster 06/27/2014    Plan  Recommended patient receive Shingles vaccine in office.  Medication Management   Miscellaneous medications: Omeprazole 40mg69mldenafil 24mg 68ms: ASA 81mg P86mnt [facility] [facility] using pill box method to organize medications and promote adherence. Patient denies missed doses of medication.   ChristiBeverly MilchD Clinical Pharmacist Brown [phone] collaborated with the care management provider regarding care management and care coordination activities outlined in this encounter and have reviewed this encounter including documentation in the note and care plan. I am certifying that I agree with the content of this note and encounter as supervising physician.

## 2019-12-24 ENCOUNTER — Ambulatory Visit: Payer: PPO | Admitting: Pharmacist

## 2019-12-24 DIAGNOSIS — I10 Essential (primary) hypertension: Secondary | ICD-10-CM

## 2019-12-24 DIAGNOSIS — E785 Hyperlipidemia, unspecified: Secondary | ICD-10-CM

## 2019-12-24 NOTE — Patient Instructions (Addendum)
Visit Information Thank you for meeting with me today!  I look forward to working with you to help you meet all of your healthcare goals and answer any questions you may have.  Feel free to contact me anytime!  Goals Addressed            This Visit's Progress   . Pharmacy Care Plan:       CARE PLAN ENTRY (see longitudinal plan of care for additional care plan information)  Current Barriers:  . Chronic Disease Management support, education, and care coordination needs related to Hypertension and Hyperlipidemia   Hypertension BP Readings from Last 3 Encounters:  10/23/19 (!) 144/86  02/18/19 130/74  05/28/18 (!) 150/100   . Pharmacist Clinical Goal(s): o Over the next 180 days, patient will work with PharmD and providers to achieve BP goal <140/90 . Current regimen:  . Atenolol 25mg  . HCTZ 25mg  . Losartan 100mg  . Interventions: o Reviewed office BP o Discussed medication adherence o Discussed diet low in sodium . Patient self care activities - Over the next 180 days, patient will: o Check BP periodically, document, and provide at future appointments o Ensure daily salt intake < 2300 mg/day  Hyperlipidemia Lab Results  Component Value Date/Time   LDLCALC 86 05/28/2018 10:06 AM   . Pharmacist Clinical Goal(s): o Over the next 180 days, patient will work with PharmD and providers to maintain LDL goal < 100 . Current regimen:  o Pravastatin 20mg  . Interventions: o Reviewed most recent lipid panel . Patient self care activities - Over the next 180 days, patient will: o Continue to focus on medication adherence by using pill box o Work on diet low in saturated fats and sodium.   Initial goal documentation        Keith Pratt was given information about Chronic Care Management services today including:  1. CCM service includes personalized support from designated clinical staff supervised by his physician, including individualized plan of care and coordination with  other care providers 2. 24/7 contact phone numbers for assistance for urgent and routine care needs. 3. Standard insurance, coinsurance, copays and deductibles apply for chronic care management only during months in which we provide at least 20 minutes of these services. Most insurances cover these services at 100%, however patients may be responsible for any copay, coinsurance and/or deductible if applicable. This service may help you avoid the need for more expensive face-to-face services. 4. Only one practitioner may furnish and bill the service in a calendar month. 5. The patient may stop CCM services at any time (effective at the end of the month) by phone call to the office staff.  Patient agreed to services and verbal consent obtained.   The patient verbalized understanding of instructions provided today and agreed to receive a mailed copy of patient instruction and/or educational materials. Telephone follow up appointment with pharmacy team member scheduled for: 6 months  Beverly Milch, PharmD Clinical Pharmacist [facility] [phone]   Managing Your Hypertension Hypertension is commonly called high blood pressure. This is when the force of your blood pressing against the walls of your arteries is too strong. Arteries are blood vessels that carry blood from your heart throughout your body. Hypertension forces the heart to work harder to pump blood, and may cause the arteries to become narrow or stiff. Having untreated or uncontrolled hypertension can cause heart attack, stroke, kidney disease, and other problems. What are blood pressure readings? A blood pressure reading consists of  a higher number over a lower number. Ideally, your blood pressure should be below 120/80. The first ("top") number is called the systolic pressure. It is a measure of the pressure in your arteries as your heart beats. The second ("bottom") number is called the diastolic pressure. It is  a measure of the pressure in your arteries as the heart relaxes. What does my blood pressure reading mean? Blood pressure is classified into four stages. Based on your blood pressure reading, your health care provider may use the following stages to determine what type of treatment you need, if any. Systolic pressure and diastolic pressure are measured in a unit called mm Hg. Normal  Systolic pressure: below 536.  Diastolic pressure: below 80. Elevated  Systolic pressure: 144-315.  Diastolic pressure: below 80. Hypertension stage 1  Systolic pressure: 400-867.  Diastolic pressure: 61-95. Hypertension stage 2  Systolic pressure: 093 or above.  Diastolic pressure: 90 or above. What health risks are associated with hypertension? Managing your hypertension is an important responsibility. Uncontrolled hypertension can lead to:  A heart attack.  A stroke.  A weakened blood vessel (aneurysm).  Heart failure.  Kidney damage.  Eye damage.  Metabolic syndrome.  Memory and concentration problems. What changes can I make to manage my hypertension? Hypertension can be managed by making lifestyle changes and possibly by taking medicines. Your health care provider will help you make a plan to bring your blood pressure within a normal range. Eating and drinking   Eat a diet that is high in fiber and potassium, and low in salt (sodium), added sugar, and fat. An example eating plan is called the DASH (Dietary Approaches to Stop Hypertension) diet. To eat this way: ? Eat plenty of fresh fruits and vegetables. Try to fill half of your plate at each meal with fruits and vegetables. ? Eat whole grains, such as whole wheat pasta, brown rice, or whole grain bread. Fill about one quarter of your plate with whole grains. ? Eat low-fat diary products. ? Avoid fatty cuts of meat, processed or cured meats, and poultry with skin. Fill about one quarter of your plate with lean proteins such as  fish, chicken without skin, beans, eggs, and tofu. ? Avoid premade and processed foods. These tend to be higher in sodium, added sugar, and fat.  Reduce your daily sodium intake. Most people with hypertension should eat less than 1,500 mg of sodium a day.  Limit alcohol intake to no more than 1 drink a day for nonpregnant women and 2 drinks a day for men. One drink equals 12 oz of beer, 5 oz of wine, or 1 oz of hard liquor. Lifestyle  Work with your health care provider to maintain a healthy body weight, or to lose weight. Ask what an ideal weight is for you.  Get at least 30 minutes of exercise that causes your heart to beat faster (aerobic exercise) most days of the week. Activities may include walking, swimming, or biking.  Include exercise to strengthen your muscles (resistance exercise), such as weight lifting, as part of your weekly exercise routine. Try to do these types of exercises for 30 minutes at least 3 days a week.  Do not use any products that contain nicotine or tobacco, such as cigarettes and e-cigarettes. If you need help quitting, ask your health care provider.  Control any long-term (chronic) conditions you have, such as high cholesterol or diabetes. Monitoring  Monitor your blood pressure at home as told by your health  care provider. Your personal target blood pressure may vary depending on your medical conditions, your age, and other factors.  Have your blood pressure checked regularly, as often as told by your health care provider. Working with your health care provider  Review all the medicines you take with your health care provider because there may be side effects or interactions.  Talk with your health care provider about your diet, exercise habits, and other lifestyle factors that may be contributing to hypertension.  Visit your health care provider regularly. Your health care provider can help you create and adjust your plan for managing hypertension. Will  I need medicine to control my blood pressure? Your health care provider may prescribe medicine if lifestyle changes are not enough to get your blood pressure under control, and if:  Your systolic blood pressure is 130 or higher.  Your diastolic blood pressure is 80 or higher. Take medicines only as told by your health care provider. Follow the directions carefully. Blood pressure medicines must be taken as prescribed. The medicine does not work as well when you skip doses. Skipping doses also puts you at risk for problems. Contact a health care provider if:  You think you are having a reaction to medicines you have taken.  You have repeated (recurrent) headaches.  You feel dizzy.  You have swelling in your ankles.  You have trouble with your vision. Get help right away if:  You develop a severe headache or confusion.  You have unusual weakness or numbness, or you feel faint.  You have severe pain in your chest or abdomen.  You vomit repeatedly.  You have trouble breathing. Summary  Hypertension is when the force of blood pumping through your arteries is too strong. If this condition is not controlled, it may put you at risk for serious complications.  Your personal target blood pressure may vary depending on your medical conditions, your age, and other factors. For most people, a normal blood pressure is less than 120/80.  Hypertension is managed by lifestyle changes, medicines, or both. Lifestyle changes include weight loss, eating a healthy, low-sodium diet, exercising more, and limiting alcohol. This information is not intended to replace advice given to you by your health care provider. Make sure you discuss any questions you have with your health care provider. Document Revised: 07/26/2018 Document Reviewed: 03/01/2016 Elsevier Patient Education  [place].

## 2020-01-15 ENCOUNTER — Ambulatory Visit: Payer: Self-pay | Admitting: Pharmacist

## 2020-01-15 NOTE — Chronic Care Management (AMB) (Addendum)
  Chronic Care Management   Follow Up Note   01/15/2020 Name: Keith Pratt MRN: [id number] DOB: 12-02-1950  Referred by: Susy Frizzle, MD Reason for referral : No chief complaint on file.   Keith Pratt is a 69 y.o. year old male who is a primary care patient of Pickard, Cammie Mcgee, MD. The CCM team was consulted for assistance with chronic disease management and care coordination needs.    Review of patient status, including review of consultants reports, relevant laboratory and other test results, and collaboration with appropriate care team members and the patient's provider was performed as part of comprehensive patient evaluation and provision of chronic care management services.    SDOH (Social Determinants of Health) assessments performed: No See Care Plan activities for detailed interventions related to [facility])      Outpatient Encounter Medications as of 01/15/2020  Medication Sig   atenolol (TENORMIN) 25 MG tablet TAKE 1 TABLET BY MOUTH EVERY DAY   ciprofloxacin (CIPRO) 500 MG tablet Take 1 tablet (500 mg total) by mouth 2 (two) times daily. (Patient not taking: Reported on 12/24/2019)   hydrochlorothiazide (HYDRODIURIL) 12.5 MG tablet TAKE 1 TABLET BY MOUTH EVERY DAY WITH LOSARTAN TABLET   ibuprofen (ADVIL,MOTRIN) 200 MG tablet Take 800 mg by mouth every 6 (six) hours as needed. (Patient not taking: Reported on 12/24/2019)   irbesartan-hydrochlorothiazide (AVALIDE) 300-12.5 MG tablet TAKE 1 TABLET BY MOUTH DAILY IN THE MORNING AS NEEDED FOR BLOOD PRESSURE (Patient not taking: Reported on 12/24/2019)   losartan (COZAAR) 100 MG tablet TAKE 1 TABLET BY MOUTH EVERY DAY   omeprazole (PRILOSEC) 40 MG capsule TAKE 1 CAPSULE BY MOUTH EVERY DAY   pravastatin (PRAVACHOL) 20 MG tablet TAKE 1 TABLET BY MOUTH EVERY DAY NEEDS APPT FOR REFILLS   sildenafil (REVATIO) 20 MG tablet TAKE 2 TO 4 TABLETS BY MOUTH AS NEEDED FOR SEXUAL ACTIVITY   sildenafil (VIAGRA) 100 MG tablet TAKE 0.5-1  TABLETS (50-100 MG TOTAL) BY MOUTH DAILY AS NEEDED FOR ERECTILE DYSFUNCTION.   No facility-administered encounter medications on file as of 01/15/2020.     Objective:  Analyze care gaps and adherence date for STAR measures. Goals Addressed   None   Care Gaps: No AWV BP > 140/90  Adherence Measures: Losartan 100mg  80-89% adherent - 0.875 PDC (4 stars) Checked fill history patient filled 9/10 for 90 days   Pravastatin 20mg  - 0.96 PDC (5 stars)  Plan:   The patient has been provided with contact information for the care management team and has been advised to call with any health related questions or concerns.   Beverly Milch, PharmD Clinical Pharmacist [place] [phone]   I have collaborated with the care management provider regarding care management and care coordination activities outlined in this encounter and have reviewed this encounter including documentation in the note and care plan. I am certifying that I agree with the content of this note and encounter as supervising physician.

## 2020-01-21 DIAGNOSIS — H26491 Other secondary cataract, right eye: Secondary | ICD-10-CM | POA: Diagnosis not present

## 2020-01-21 DIAGNOSIS — H524 Presbyopia: Secondary | ICD-10-CM | POA: Diagnosis not present

## 2020-01-21 DIAGNOSIS — Z961 Presence of intraocular lens: Secondary | ICD-10-CM | POA: Diagnosis not present

## 2020-01-21 DIAGNOSIS — H47321 Drusen of optic disc, right eye: Secondary | ICD-10-CM | POA: Diagnosis not present

## 2020-03-20 ENCOUNTER — Other Ambulatory Visit: Payer: Self-pay | Admitting: Family Medicine

## 2020-04-13 ENCOUNTER — Other Ambulatory Visit: Payer: Self-pay | Admitting: Family Medicine

## 2020-05-04 ENCOUNTER — Telehealth: Payer: Self-pay | Admitting: Pharmacist

## 2020-05-04 NOTE — Progress Notes (Addendum)
Chronic Care Management Pharmacy Assistant   Name: Keith Pratt  MRN: [id number] DOB: 1951-02-03  Reason for Encounter: Disease State for HTN.  Patient Questions:  1.  Have you seen any other providers since your last visit? Yes.   2.  Any changes in your medicines or health? No.  PCP : Susy Frizzle, MD   Their chronic conditions include: HTN,HLD.  Office Visits:  Consults: 01/21/20 Ophthalmology Marylynn Pearson. No information given.   Allergies:   Allergies  Allergen Reactions   Cardura [Doxazosin Mesylate] Other (See Comments)    Dizzy, hot all over - per pt "jusr felt sick"   Codeine     REACTION: nauses and vomiting   Amlodipine Swelling    Swelling in feet and ankles    Medications: Outpatient Encounter Medications as of 05/04/2020  Medication Sig   atenolol (TENORMIN) 25 MG tablet TAKE 1 TABLET BY MOUTH EVERY DAY   ciprofloxacin (CIPRO) 500 MG tablet Take 1 tablet (500 mg total) by mouth 2 (two) times daily. (Patient not taking: Reported on 12/24/2019)   hydrochlorothiazide (HYDRODIURIL) 12.5 MG tablet TAKE 1 TABLET BY MOUTH EVERY DAY WITH LOSARTAN TABLET   ibuprofen (ADVIL,MOTRIN) 200 MG tablet Take 800 mg by mouth every 6 (six) hours as needed. (Patient not taking: Reported on 12/24/2019)   irbesartan-hydrochlorothiazide (AVALIDE) 300-12.5 MG tablet TAKE 1 TABLET BY MOUTH DAILY IN THE MORNING AS NEEDED FOR BLOOD PRESSURE (Patient not taking: Reported on 12/24/2019)   losartan (COZAAR) 100 MG tablet TAKE 1 TABLET BY MOUTH EVERY DAY   omeprazole (PRILOSEC) 40 MG capsule TAKE 1 CAPSULE BY MOUTH EVERY DAY   pravastatin (PRAVACHOL) 20 MG tablet TAKE 1 TABLET BY MOUTH EVERY DAY- NEEDS APPT FOR REFILLS   sildenafil (REVATIO) 20 MG tablet TAKE 2 TO 4 TABLETS BY MOUTH AS NEEDED FOR SEXUAL ACTIVITY   sildenafil (VIAGRA) 100 MG tablet TAKE 0.5-1 TABLETS (50-100 MG TOTAL) BY MOUTH DAILY AS NEEDED FOR ERECTILE DYSFUNCTION.   No facility-administered encounter  medications on file as of 05/04/2020.    Current Diagnosis: Patient Active Problem List   Diagnosis Date Noted   Right knee pain 10/08/2013   Hyperlipidemia    Hypertension     Goals Addressed   None    Reviewed chart prior to disease state call. Spoke with patient regarding BP  Recent Office Vitals: BP Readings from Last 3 Encounters:  10/23/19 (!) 144/86  02/18/19 130/74  05/28/18 (!) 150/100   Pulse Readings from Last 3 Encounters:  10/23/19 71  02/18/19 80  05/28/18 68    Wt Readings from Last 3 Encounters:  10/23/19 214 lb (97.1 kg)  02/18/19 213 lb (96.6 kg)  05/28/18 223 lb (101.2 kg)     Kidney Function Lab Results  Component Value Date/Time   CREATININE 1.18 10/23/2019 04:27 PM   CREATININE 1.21 (H) 05/28/2018 10:06 AM   GFRNONAA 62 10/23/2019 04:27 PM   GFRAA 72 10/23/2019 04:27 PM    BMP Latest Ref Rng & Units 10/23/2019 05/28/2018 05/07/2015  Glucose 65 - 99 mg/dL 120(H) 90 92  BUN 7 - 25 mg/dL 14 13 13   Creatinine 0.70 - 1.18 mg/dL 1.18 1.21(H) 0.93  BUN/Creat Ratio 6 - 22 (calc) NOT APPLICABLE 11 -  Sodium 135 - 146 mmol/L 137 136 136  Potassium 3.5 - 5.3 mmol/L 3.6 4.2 4.2  Chloride 98 - 110 mmol/L 98 97(L) 99  CO2 20 - 32 mmol/L 29 31 26   Calcium 8.6 - 10.3  mg/dL 9.5 9.4 9.7    Current antihypertensive regimen:  Atenolol 25 mg HCTZ 25 mg Losartan 100 mg  How often are you checking your Blood Pressure? Patient stated he doesn't check his blood pressure but he does have a working blood pressure machine.    Current home BP readings: N/A  What recent interventions/DTPs have been made by any provider to improve Blood Pressure control since last CPP Visit: None  Any recent hospitalizations or ED visits since last visit with CPP? No.  What diet changes have been made to improve Blood Pressure Control?  Patient stated he eats pretty good. He stated he eats yogurt and cereal in the morning followed by two more meals a day.  What exercise is  being done to improve your Blood Pressure Control?  Patient stated his actively level is slim. He stated he does do yard and house work daily.  Adherence Review: Is the patient currently on ACE/ARB medication? Yes, Losartan 100 mg  Does the patient have >5 day gap between last estimated fill dates? No, CPP Please Check.  Patient stated he does not have any concerns at this time and has no problems getting his medication.  Follow-Up:  Pharmacist Review   Charlann Lange, RMA Clinical Pharmacist Assistant [phone]   5 minutes spent in review, coordination, and documentation.  Reviewed by: Beverly Milch, PharmD Clinical Pharmacist [facility] [phone]

## 2020-05-16 ENCOUNTER — Other Ambulatory Visit: Payer: Self-pay | Admitting: Family Medicine

## 2020-06-23 NOTE — Progress Notes (Signed)
Chronic Care Management Pharmacy Note  06/24/2020 Name:  JOBANY MONTELLANO MRN:  [id number] DOB:  1950-07-10  Subjective: Keith Pratt is an 70 y.o. year old male who is a primary patient of Pickard, Cammie Mcgee, MD.  The CCM team was consulted for assistance with disease management and care coordination needs.    Engaged with patient by telephone for follow up visit in response to provider referral for pharmacy case management and/or care coordination services.   Consent to Services:  The patient was given the following information about Chronic Care Management services today, agreed to services, and gave verbal consent: 1. CCM service includes personalized support from designated clinical staff supervised by the primary care provider, including individualized plan of care and coordination with other care providers 2. 24/7 contact phone numbers for assistance for urgent and routine care needs. 3. Service will only be billed when office clinical staff spend 20 minutes or more in a month to coordinate care. 4. Only one practitioner may furnish and bill the service in a calendar month. 5.The patient may stop CCM services at any time (effective at the end of the month) by phone call to the office staff. 6. The patient will be responsible for cost sharing (co-pay) of up to 20% of the service fee (after annual deductible is met). Patient agreed to services and consent obtained.  Patient Care Team: Susy Frizzle, MD as PCP - General (Family Medicine) Edythe Clarity, [facility] as Pharmacist (Pharmacist)  Recent office visits: None since last CCM visit on 12/24/19  Recent consult visits: None since last CCM visit on 12/24/19  Hospital visits: None in previous 6 months  Objective:  Lab Results  Component Value Date   CREATININE 1.18 10/23/2019   BUN 14 10/23/2019   GFRNONAA 62 10/23/2019   GFRAA 72 10/23/2019   NA 137 10/23/2019   K 3.6 10/23/2019   CALCIUM 9.5 10/23/2019   CO2 29  10/23/2019    No results found for: HGBA1C, FRUCTOSAMINE, GFR, MICROALBUR  Last diabetic Eye exam: No results found for: HMDIABEYEEXA  Last diabetic Foot exam: No results found for: HMDIABFOOTEX   Lab Results  Component Value Date   CHOL 155 05/28/2018   HDL 30 (L) 05/28/2018   LDLCALC 86 05/28/2018   TRIG 322 (H) 05/28/2018   CHOLHDL 5.2 (H) 05/28/2018    Hepatic Function Latest Ref Rng & Units 05/28/2018 05/07/2015 10/06/2014  Total Protein 6.1 - 8.1 g/dL 6.8 6.8 6.8  Albumin 3.6 - 5.1 g/dL - 4.1 3.9  AST 10 - 35 U/L _0 ALT 9 - 46 U/L _1 Alk Phosphatase 40 - 115 U/L - 82 78  Total Bilirubin 0.2 - 1.2 mg/dL 0.5 0.6 0.4  Bilirubin, Direct 0.0 - 0.3 mg/dL - - -    Lab Results  Component Value Date/Time   TSH 5.365 (H) 10/25/2012 09:12 AM    CBC Latest Ref Rng & Units 10/23/2019 05/28/2018 05/07/2015  WBC 3.8 - 10.8 Thousand/uL 7.9 7.4 8.1  Hemoglobin 13.2 - 17.1 g/dL 14.2 14.0 14.1  Hematocrit 38.5 - 50.0 % 40.7 40.6 41.2  Platelets 140 - 400 Thousand/uL 253 267 242    No results found for: VD25OH  Clinical ASCVD: No  The 10-year ASCVD risk score Mikey Bussing DC Jr., et al., 2013) is: 30.9%   Values used to calculate the score:     Age: 32 years     Sex: Male     Is  Non-Hispanic African American: No     Diabetic: No     Tobacco smoker: No     Systolic Blood Pressure: 458 mmHg     Is BP treated: Yes     HDL Cholesterol: 30 mg/dL     Total Cholesterol: 155 mg/dL    Depression screen [facility] 2/9 05/28/2018 06/29/2016  Decreased Interest 0 0  Down, Depressed, Hopeless 0 0  PHQ - 2 Score 0 0  Altered sleeping - 2  Tired, decreased energy - 0  Change in appetite - 0  Feeling bad or failure about yourself  - 0  Trouble concentrating - 0  Moving slowly or fidgety/restless - 0  Suicidal thoughts - 0  PHQ-9 Score - 2  Difficult doing work/chores - Not difficult at all     Social History   Tobacco Use  Smoking Status Former Smoker   Quit date: 01/08/2003    Years since quitting: 17.4  Smokeless Tobacco Never Used   BP Readings from Last 3 Encounters:  10/23/19 (!) 144/86  02/18/19 130/74  05/28/18 (!) 150/100   Pulse Readings from Last 3 Encounters:  10/23/19 71  02/18/19 80  05/28/18 68   Wt Readings from Last 3 Encounters:  10/23/19 214 lb (97.1 kg)  02/18/19 213 lb (96.6 kg)  05/28/18 223 lb (101.2 kg)    Assessment/Interventions: Review of patient past medical history, allergies, medications, health status, including review of consultants reports, laboratory and other test data, was performed as part of comprehensive evaluation and provision of chronic care management services.   SDOH:  (Social Determinants of Health) assessments and interventions performed: Yes   Financial Resource Strain: Low Risk    Difficulty of Paying Living Expenses: Not very hard     CCM Care Plan  Allergies  Allergen Reactions   Cardura [Doxazosin Mesylate] Other (See Comments)    Dizzy, hot all over - per pt "jusr felt sick"   Codeine     REACTION: nauses and vomiting   Amlodipine Swelling    Swelling in feet and ankles    Medications Reviewed Today    Reviewed by Edythe Clarity, [facility] (Pharmacist) on 06/24/20 at 1245  Med List Status: <None>  Medication Order Taking? Sig Documenting Provider Last Dose Status Informant  atenolol (TENORMIN) 25 MG tablet [id number] Yes TAKE 1 TABLET BY MOUTH EVERY DAY Susy Frizzle, MD Taking Active   ciprofloxacin (CIPRO) 500 MG tablet [id number] Yes Take 1 tablet (500 mg total) by mouth 2 (two) times daily. Susy Frizzle, MD Taking Active   hydrochlorothiazide (HYDRODIURIL) 12.5 MG tablet [id number] Yes TAKE 1 TABLET BY MOUTH EVERY DAY WITH LOSARTAN TABLET Susy Frizzle, MD Taking Active   ibuprofen (ADVIL,MOTRIN) 200 MG tablet [id number] Yes Take 800 mg by mouth every 6 (six) hours as needed. [provider] Taking Active   irbesartan-hydrochlorothiazide (AVALIDE) 300-12.5 MG tablet  [id number] Yes TAKE 1 TABLET BY MOUTH DAILY IN THE MORNING AS NEEDED FOR BLOOD PRESSURE Susy Frizzle, MD Taking Active   losartan (COZAAR) 100 MG tablet [id number] Yes TAKE 1 TABLET BY MOUTH EVERY DAY Susy Frizzle, MD Taking Active   omeprazole (PRILOSEC) 40 MG capsule [id number] Yes TAKE 1 CAPSULE BY MOUTH EVERY DAY Susy Frizzle, MD Taking Active   pravastatin (PRAVACHOL) 20 MG tablet [id number] Yes TAKE 1 TABLET BY MOUTH EVERY DAY- NEEDS APPT FOR REFILLS Susy Frizzle, MD Taking Active   sildenafil (REVATIO) 20 MG tablet [id number] Yes TAKE 2 TO  4 TABLETS BY MOUTH AS NEEDED FOR SEXUAL ACTIVITY Susy Frizzle, MD Taking Active   sildenafil (VIAGRA) 100 MG tablet [id number] Yes TAKE 0.5-1 TABLETS (50-100 MG TOTAL) BY MOUTH DAILY AS NEEDED FOR ERECTILE DYSFUNCTION. Susy Frizzle, MD Taking Active           Patient Active Problem List   Diagnosis Date Noted   Right knee pain 10/08/2013   Hyperlipidemia    Hypertension     Immunization History  Administered Date(s) Administered   Influenza, High Dose Seasonal PF 03/01/2018   Pneumococcal Conjugate-13 05/06/2015   Pneumococcal Polysaccharide-23 06/06/2012   Tdap 06/06/2012   Zoster 06/27/2014    Conditions to be addressed/monitored:  Hypertension and Hyperlipidemia  Care Plan : General Pharmacy (Adult)  Updates made by Edythe Clarity, RPH since 06/24/2020 12:00 AM    Problem: HTN, HLD   Priority: High  Onset Date: 06/24/2020    Long-Range Goal: Patient-Specific Goal   Start Date: 06/24/2020  Expected End Date: 12/25/2020  This Visit's Progress: On track  Priority: High  Note:   Current Barriers:   Does not maintain contact with provider office   Pharmacist Clinical Goal(s):   Over the next 180 days, patient will verbalize ability to afford treatment regimen  maintain control of blood pressure  as evidenced by home monitoring   contact provider office for questions/concerns as evidenced  notation of same in electronic health record  Come in to PCP for CPE. through collaboration with PharmD and provider.   Interventions:  1:1 collaboration with Susy Frizzle, MD regarding development and update of comprehensive plan of care as evidenced by provider attestation and co-signature  Inter-disciplinary care team collaboration (see longitudinal plan of care)  Comprehensive medication review performed; medication list updated in electronic medical record  Hypertension (BP goal <140/90) -Controlled -Current treatment:  Atenolol $RemoveB'25mg'bsFggyIO$   HCTZ 12.$Remove'5mg'UwmtQzm$   Losartan $RemoveB'100mg'kmUwLCYs$  -Medications previously tried: amlodipine (leg swelling)  -Current home readings: Not checking at home, unable to assess  -Denies hypotensive/hypertensive symptoms -Educated on BP goals and benefits of medications for prevention of heart attack, stroke and kidney damage; Importance of home blood pressure monitoring; Symptoms of hypotension and importance of maintaining adequate hydration; -Counseled to monitor BP at home periodically, document, and provide log at future appointments -Patient has not been in for regular physical in quite some time, recommended he make that appointment to follow up on labs, etc.  Also needs BP checked. -Reports compliance with all medications -Recommended to continue current medication  Hyperlipidemia: (LDL goal < 100) -Controlled, except for TG -Current treatment:  Pravastatin $RemoveBefo'20mg'hmHhuzJOawx$  -Medications previously tried: none ntoed -Patient would benefit from updated lipid panel -He denies myalgias and reports adherence with medication.  -Educated on Cholesterol goals;  Importance of regular follow up with PCP. -Recommended to continue current medication   Patient Goals/Self-Care Activities  Over the next 180 days, patient will:  - take medications as prescribed check blood pressure periodically, document, and provide at future appointments Make appointment for complete  physical with Dr. [name].  Follow Up Plan: The care management team will reach out to the patient again over the next 180 days.        Medication Assistance: None required.  Patient affirms current coverage meets needs.  Patient's preferred pharmacy is:  [id number] - [place], [address] [place] [address] Phone: [phone] Fax: [phone]  [place], [address], [address] [place], [place] [address] Phone: [phone] Fax: [place], [place] [address] Phone: [phone] Fax: [phone]  Uses pill box? Yes Pt endorses 100% compliance  We discussed: Benefits of medication synchronization, packaging and delivery as well as enhanced pharmacist oversight with [employer]. Patient decided to: Continue current medication management strategy  Care Plan and Follow Up Patient Decision:  Patient agrees to Care Plan and Follow-up.  Plan: The care management team will reach out to the patient again over the next 180 days.  Beverly Milch, PharmD Clinical Pharmacist [place] [phone]

## 2020-06-24 ENCOUNTER — Ambulatory Visit (INDEPENDENT_AMBULATORY_CARE_PROVIDER_SITE_OTHER): Payer: PPO | Admitting: Pharmacist

## 2020-06-24 DIAGNOSIS — E785 Hyperlipidemia, unspecified: Secondary | ICD-10-CM

## 2020-06-24 DIAGNOSIS — I10 Essential (primary) hypertension: Secondary | ICD-10-CM | POA: Diagnosis not present

## 2020-06-24 NOTE — Patient Instructions (Addendum)
Visit Information  Goals Addressed            This Visit's Progress   . Track and Manage My Blood Pressure-Hypertension       Timeframe:  Long-Range Goal Priority:  High Start Date:       06/24/20                      Expected End Date:     12/25/20                  Follow Up Date 06/31/22   - check blood pressure 3 times per week - write blood pressure results in a log or diary    Why is this important?    You won't feel high blood pressure, but it can still hurt your blood vessels.   High blood pressure can cause heart or kidney problems. It can also cause a stroke.   Making lifestyle changes like losing a little weight or eating less salt will help.   Checking your blood pressure at home and at different times of the day can help to control blood pressure.   If the doctor prescribes medicine remember to take it the way the doctor ordered.   Call the office if you cannot afford the medicine or if there are questions about it.     Notes:       Patient Care Plan: General Pharmacy (Adult)    Problem Identified: HTN, HLD   Priority: High  Onset Date: 06/24/2020    Long-Range Goal: Patient-Specific Goal   Start Date: 06/24/2020  Expected End Date: 12/25/2020  This Visit's Progress: On track  Priority: High  Note:   Current Barriers:  . Does not maintain contact with provider office   Pharmacist Clinical Goal(s):  Marland Kitchen Over the next 180 days, patient will verbalize ability to afford treatment regimen . maintain control of blood pressure  as evidenced by home monitoring  . contact provider office for questions/concerns as evidenced notation of same in electronic health record . Come in to PCP for CPE. through collaboration with PharmD and provider.   Interventions: . 1:1 collaboration with Susy Frizzle, MD regarding development and update of comprehensive plan of care as evidenced by provider attestation and co-signature . Inter-disciplinary care team  collaboration (see longitudinal plan of care) . Comprehensive medication review performed; medication list updated in electronic medical record  Hypertension (BP goal <140/90) -Controlled -Current treatment:  Atenolol 25mg   HCTZ 12.5mg   Losartan 100mg  -Medications previously tried: amlodipine (leg swelling)  -Current home readings: Not checking at home, unable to assess  -Denies hypotensive/hypertensive symptoms -Educated on BP goals and benefits of medications for prevention of heart attack, stroke and kidney damage; Importance of home blood pressure monitoring; Symptoms of hypotension and importance of maintaining adequate hydration; -Counseled to monitor BP at home periodically, document, and provide log at future appointments -Patient has not been in for regular physical in quite some time, recommended he make that appointment to follow up on labs, etc.  Also needs BP checked. -Reports compliance with all medications -Recommended to continue current medication  Hyperlipidemia: (LDL goal < 100) -Controlled, except for TG -Current treatment:  Pravastatin 20mg  -Medications previously tried: none ntoed -Patient would benefit from updated lipid panel -He denies myalgias and reports adherence with medication.  -Educated on Cholesterol goals;  Importance of regular follow up with PCP. -Recommended to continue current medication   Patient Goals/Self-Care Activities . Over the next  180 days, patient will:  - take medications as prescribed check blood pressure periodically, document, and provide at future appointments Make appointment for complete physical with Dr. [name].  Follow Up Plan: The care management team will reach out to the patient again over the next 180 days.        The patient verbalized understanding of instructions, educational materials, and care plan provided today and agreed to receive a mailed copy of patient instructions, educational materials, and  care plan.  Telephone follow up appointment with pharmacy team member scheduled for: 6 months  Edythe Clarity, [facility]  High Cholesterol  High cholesterol is a condition in which the blood has high levels of a white, waxy substance similar to fat (cholesterol). The liver makes all the cholesterol that the body needs. The human body needs small amounts of cholesterol to help build cells. A person gets extra or excess cholesterol from the food that he or she eats. The blood carries cholesterol from the liver to the rest of the body. If you have high cholesterol, deposits (plaques) may build up on the walls of your arteries. Arteries are the blood vessels that carry blood away from your heart. These plaques make the arteries narrow and stiff. Cholesterol plaques increase your risk for heart attack and stroke. Work with your health care provider to keep your cholesterol levels in a healthy range. What increases the risk? The following factors may make you more likely to develop this condition:  Eating foods that are high in animal fat (saturated fat) or cholesterol.  Being overweight.  Not getting enough exercise.  A family history of high cholesterol (familial hypercholesterolemia).  Use of tobacco products.  Having diabetes. What are the signs or symptoms? There are no symptoms of this condition. How is this diagnosed? This condition may be diagnosed based on the results of a blood test.  If you are older than 72 years of age, your health care provider may check your cholesterol levels every 4-6 years.  You may be checked more often if you have high cholesterol or other risk factors for heart disease. The blood test for cholesterol measures:  "Bad" cholesterol, or LDL cholesterol. This is the main type of cholesterol that causes heart disease. The desired level is less than 100 mg/dL.  "Good" cholesterol, or HDL cholesterol. HDL helps protect against heart disease by cleaning the  arteries and carrying the LDL to the liver for processing. The desired level for HDL is 60 mg/dL or higher.  Triglycerides. These are fats that your body can store or burn for energy. The desired level is less than 150 mg/dL.  Total cholesterol. This measures the total amount of cholesterol in your blood and includes LDL, HDL, and triglycerides. The desired level is less than 200 mg/dL. How is this treated? This condition may be treated with:  Diet changes. You may be asked to eat foods that have more fiber and less saturated fats or added sugar.  Lifestyle changes. These may include regular exercise, maintaining a healthy weight, and quitting use of tobacco products.  Medicines. These are given when diet and lifestyle changes have not worked. You may be prescribed a statin medicine to help lower your cholesterol levels. Follow these instructions at home: Eating and drinking  Eat a healthy, balanced diet. This diet includes: ? Daily servings of a variety of fresh, frozen, or canned fruits and vegetables. ? Daily servings of whole grain foods that are rich in fiber. ? Foods that  are low in saturated fats and trans fats. These include poultry and fish without skin, lean cuts of meat, and low-fat dairy products. ? A variety of fish, especially oily fish that contain omega-3 fatty acids. Aim to eat fish at least 2 times a week.  Avoid foods and drinks that have added sugar.  Use healthy cooking methods, such as roasting, grilling, broiling, baking, poaching, steaming, and stir-frying. Do not fry your food except for stir-frying.   Lifestyle  Get regular exercise. Aim to exercise for a total of 150 minutes a week. Increase your activity level by doing activities such as gardening, walking, and taking the stairs.  Do not use any products that contain nicotine or tobacco, such as cigarettes, e-cigarettes, and chewing tobacco. If you need help quitting, ask your health care provider.   General  instructions  Take over-the-counter and prescription medicines only as told by your health care provider.  Keep all follow-up visits as told by your health care provider. This is important. Where to find more information  American Heart Association: www.heart.org  National Heart, Lung, and Blood Institute: https://wilson-eaton.com/ Contact a health care provider if:  You have trouble achieving or maintaining a healthy diet or weight.  You are starting an exercise program.  You are unable to stop smoking. Get help right away if:  You have chest pain.  You have trouble breathing.  You have any symptoms of a stroke. "BE FAST" is an easy way to remember the main warning signs of a stroke: ? B - Balance. Signs are dizziness, sudden trouble walking, or loss of balance. ? E - Eyes. Signs are trouble seeing or a sudden change in vision. ? F - Face. Signs are sudden weakness or numbness of the face, or the face or eyelid drooping on one side. ? A - Arms. Signs are weakness or numbness in an arm. This happens suddenly and usually on one side of the body. ? S - Speech. Signs are sudden trouble speaking, slurred speech, or trouble understanding what people say. ? T - Time. Time to call emergency services. Write down what time symptoms started.  You have other signs of a stroke, such as: ? A sudden, severe headache with no known cause. ? Nausea or vomiting. ? Seizure. These symptoms may represent a serious problem that is an emergency. Do not wait to see if the symptoms will go away. Get medical help right away. Call your local emergency services (911 in the [place] Do not drive yourself to the hospital. Summary  Cholesterol plaques increase your risk for heart attack and stroke. Work with your health care provider to keep your cholesterol levels in a healthy range.  Eat a healthy, balanced diet, get regular exercise, and maintain a healthy weight.  Do not use any products that contain nicotine  or tobacco, such as cigarettes, e-cigarettes, and chewing tobacco.  Get help right away if you have any symptoms of a stroke. This information is not intended to replace advice given to you by your health care provider. Make sure you discuss any questions you have with your health care provider. Document Revised: 03/03/2019 Document Reviewed: 03/03/2019 Elsevier Patient Education  2021 [employer].

## 2020-06-29 ENCOUNTER — Ambulatory Visit (INDEPENDENT_AMBULATORY_CARE_PROVIDER_SITE_OTHER): Payer: PPO | Admitting: Family Medicine

## 2020-06-29 ENCOUNTER — Other Ambulatory Visit: Payer: Self-pay

## 2020-06-29 VITALS — BP 132/84 | HR 60 | Temp 98.2°F | Resp 14 | Ht 74.0 in | Wt 219.0 lb

## 2020-06-29 DIAGNOSIS — Z125 Encounter for screening for malignant neoplasm of prostate: Secondary | ICD-10-CM | POA: Diagnosis not present

## 2020-06-29 DIAGNOSIS — Z0000 Encounter for general adult medical examination without abnormal findings: Secondary | ICD-10-CM | POA: Diagnosis not present

## 2020-06-29 LAB — CBC WITH DIFFERENTIAL/PLATELET: Neutro Abs: 3146 cells/uL (ref 1500–7800)

## 2020-06-29 NOTE — Progress Notes (Signed)
Subjective:    Patient ID: Keith Pratt, male    DOB: 08/02/1950, 70 y.o.   MRN: [id number]  HPI  Patient is here today for complete physical exam.  At his last visit, his PSA had risen to greater than 3.  He typically runs between 2 and 3.  He denies any lower urinary tract symptoms.  He is due to recheck that.  His last colonoscopy was in 2014.  He is due for repeat colonoscopy in 2 years.  He is due for a COVID shot/booster.  I recommended that.  He declines a flu shot.  He is also due for a shingles vaccine. Past Medical History:  Diagnosis Date  . AAA (abdominal aortic aneurysm) (HCC)    3.5 cm  . Anxiety   . Arthritis   . Cancer ([place])    basal cell CA-under left arm  . Cataract    [facility] 06/29/2020  . COPD (chronic obstructive pulmonary disease) ([place])   . Hyperlipidemia   . Hypertension   . Shingles    July 2014  . Thyroid disease    as a child- no problem as adult  . Ulcer    Past Surgical History:  Procedure Laterality Date  . CATARACT EXTRACTION     right eye  . COLONOSCOPY    . EYE SURGERY N/A    [facility] 06/29/2020  . JOINT REPLACEMENT N/A    [facility] 06/29/2020  . right arm surgery    . TONSILLECTOMY     Current Outpatient Medications on File Prior to Visit  Medication Sig Dispense Refill  . aspirin 81 MG EC tablet aspirin 81 mg tablet,delayed release  TAKE 1 TABLET BY MOUTH TWICE A DAY    . atenolol (TENORMIN) 25 MG tablet TAKE 1 TABLET BY MOUTH EVERY DAY 90 tablet 2  . hydrochlorothiazide (HYDRODIURIL) 12.5 MG tablet TAKE 1 TABLET BY MOUTH EVERY DAY WITH LOSARTAN TABLET 90 tablet 2  . ibuprofen (ADVIL,MOTRIN) 200 MG tablet Take 800 mg by mouth every 6 (six) hours as needed.    . irbesartan-hydrochlorothiazide (AVALIDE) 300-12.5 MG tablet TAKE 1 TABLET BY MOUTH DAILY IN THE MORNING AS NEEDED FOR BLOOD PRESSURE 90 tablet 3  . losartan (COZAAR) 100 MG tablet TAKE 1 TABLET BY MOUTH EVERY DAY 90 tablet 1  . pravastatin (PRAVACHOL) 20 MG tablet TAKE 1  TABLET BY MOUTH EVERY DAY- NEEDS APPT FOR REFILLS 90 tablet 1  . sildenafil (VIAGRA) 100 MG tablet TAKE 0.5-1 TABLETS (50-100 MG TOTAL) BY MOUTH DAILY AS NEEDED FOR ERECTILE DYSFUNCTION. 50 tablet 3   No current facility-administered medications on file prior to visit.   Allergies  Allergen Reactions  . Cardura [Doxazosin Mesylate] Other (See Comments)    Dizzy, hot all over - per pt "jusr felt sick"  . Codeine     REACTION: nauses and vomiting  . Amlodipine Swelling    Swelling in feet and ankles   Social History   Socioeconomic History  . Marital status: Divorced    Spouse name: Not on file  . Number of children: Not on file  . Years of education: Not on file  . Highest education level: Not on file  Occupational History  . Not on file  Tobacco Use  . Smoking status: Former Smoker    Quit date: 01/08/2003    Years since quitting: 17.4  . Smokeless tobacco: Never Used  Substance and Sexual Activity  . Alcohol use: No  . Drug use: No  . Sexual  activity: Yes  Other Topics Concern  . Not on file  Social History Narrative  . Not on file   Social Determinants of Health   Financial Resource Strain: Low Risk   . Difficulty of Paying Living Expenses: Not very hard  Food Insecurity: Not on file  Transportation Needs: Not on file  Physical Activity: Not on file  Stress: Not on file  Social Connections: Not on file  Intimate Partner Violence: Not on file      Review of Systems  All other systems reviewed and are negative.      Objective:   Physical Exam Vitals reviewed.  Constitutional:      General: He is not in acute distress.    Appearance: He is well-developed. He is not diaphoretic.  HENT:     Head: Normocephalic and atraumatic.     Right Ear: External ear normal.     Left Ear: External ear normal.     Nose: Nose normal.  Eyes:     General: No scleral icterus.       Right eye: No discharge.        Left eye: No discharge.     Conjunctiva/sclera:  Conjunctivae normal.     Pupils: Pupils are equal, round, and reactive to light.  Neck:     Thyroid: No thyromegaly.     Vascular: No JVD.     Trachea: No tracheal deviation.  Cardiovascular:     Rate and Rhythm: Normal rate and regular rhythm.     Heart sounds: Normal heart sounds. No murmur heard. No gallop.   Pulmonary:     Effort: Pulmonary effort is normal. No respiratory distress.     Breath sounds: Normal breath sounds. No stridor. No wheezing or rales.  Chest:     Chest wall: No tenderness.  Abdominal:     General: Bowel sounds are normal. There is no distension.     Palpations: Abdomen is soft. There is no mass.     Tenderness: There is no abdominal tenderness. There is no guarding or rebound.  Genitourinary:    Prostate: Enlarged.     Rectum: Normal.  Musculoskeletal:     Cervical back: Neck supple.  Lymphadenopathy:     Cervical: No cervical adenopathy.  Skin:    General: Skin is warm.     Coloration: Skin is not pale.     Findings: No erythema or rash.  Neurological:     Mental Status: He is alert and oriented to person, place, and time.     Cranial Nerves: No cranial nerve deficit.     Motor: No abnormal muscle tone.     Coordination: Coordination normal.     Deep Tendon Reflexes: Reflexes are normal and symmetric.  Psychiatric:        Behavior: Behavior normal.        Thought Content: Thought content normal.        Judgment: Judgment normal.           Assessment & Plan:  General medical exam - Plan: CBC with Differential/Platelet, COMPLETE METABOLIC PANEL WITH GFR, PSA  Prostate cancer screening - Plan: PSA  Prostate is enlarged but symmetric.  Recommend a COVID booster.  Recommended Shingrix.  Offered the patient a flu shot but he politely declined.  He denies any memory loss, falls, depression.  Blood pressure today is excellent.  Check CBC, CMP, PSA.  Patient is not fasting so I cannot check a lipid panel.  If PSA is  greater than 4, I will consult  urology.

## 2020-06-30 ENCOUNTER — Encounter: Payer: Self-pay | Admitting: *Deleted

## 2020-06-30 LAB — COMPLETE METABOLIC PANEL WITH GFR
AG Ratio: 1.7 (calc) (ref 1.0–2.5)
ALT: 29 U/L (ref 9–46)
AST: 24 U/L (ref 10–35)
Albumin: 4.5 g/dL (ref 3.6–5.1)
Alkaline phosphatase (APISO): 61 U/L (ref 35–144)
BUN: 18 mg/dL (ref 7–25)
CO2: 29 mmol/L (ref 20–32)
Calcium: 9.4 mg/dL (ref 8.6–10.3)
Chloride: 98 mmol/L (ref 98–110)
Creat: 1.17 mg/dL (ref 0.70–1.18)
GFR, Est African American: 72 mL/min/{1.73_m2} (ref >=60)
GFR, Est Non African American: 62 mL/min/{1.73_m2} (ref >=60)
Globulin: 2.6 g/dL (calc) (ref 1.9–3.7)
Glucose, Bld: 93 mg/dL (ref 65–99)
Potassium: 4 mmol/L (ref 3.5–5.3)
Sodium: 138 mmol/L (ref 135–146)
Total Bilirubin: 0.5 mg/dL (ref 0.2–1.2)
Total Protein: 7.1 g/dL (ref 6.1–8.1)

## 2020-06-30 LAB — CBC WITH DIFFERENTIAL/PLATELET
Absolute Monocytes: 768 cells/uL (ref 200–950)
Basophils Absolute: 99 cells/uL (ref 0–200)
Basophils Relative: 1.3 %
Eosinophils Absolute: 486 cells/uL (ref 15–500)
Eosinophils Relative: 6.4 %
HCT: 41.5 % (ref 38.5–50.0)
Hemoglobin: 14.5 g/dL (ref 13.2–17.1)
Lymphs Abs: 3101 cells/uL (ref 850–3900)
MCH: 31.4 pg (ref 27.0–33.0)
MCHC: 34.9 g/dL (ref 32.0–36.0)
MCV: 89.8 fL (ref 80.0–100.0)
MPV: 10.1 fL (ref 7.5–12.5)
Monocytes Relative: 10.1 %
Neutrophils Relative %: 41.4 %
Platelets: 216 10*3/uL (ref 140–400)
RBC: 4.62 10*6/uL (ref 4.20–5.80)
RDW: 12.6 % (ref 11.0–15.0)
Total Lymphocyte: 40.8 %
WBC: 7.6 10*3/uL (ref 3.8–10.8)

## 2020-06-30 LAB — PSA: PSA: 3.25 ng/mL (ref <=4.0)

## 2020-08-23 ENCOUNTER — Telehealth: Payer: Self-pay | Admitting: Family Medicine

## 2020-08-23 NOTE — Telephone Encounter (Signed)
We received a fax from [facility] that patient is requesting a refill of Omeprazole 40 mg and its not showing on current list. It was last filled 02/2020. Please advise.

## 2020-08-24 MED ORDER — ESOMEPRAZOLE MAGNESIUM 40 MG PO CPDR: 40.0000 mg | DELAYED_RELEASE_CAPSULE | Freq: Every day | ORAL | 1 refills | Status: DC

## 2020-08-24 NOTE — Telephone Encounter (Signed)
Medication sent to pharmacy per provider request.

## 2020-08-24 NOTE — Telephone Encounter (Signed)
Ok if he needs it.

## 2020-08-27 ENCOUNTER — Telehealth: Payer: Self-pay | Admitting: Pharmacist

## 2020-08-27 NOTE — Progress Notes (Addendum)
Chronic Care Management Pharmacy Assistant   Name: Keith Pratt  MRN: [id number] DOB: [birth year]  Reason for Encounter: Disease State For HTN.    Conditions to be addressed/monitored: HTN,HLD.  Recent office visits:  08/23/20 (Telephone) Refilled on Esomeprazole Magnesium 40 mg daily. (Per patient he stated on 08/27/20 he does not want this medication) 06/29/20 Dr. [name]. For general medical exam. STARTED Sildenafil to 100 mg take 0.5-1 tablet by mouth daily as needed. STOPPED Ciprofloxacin and Omeprazole.   Recent consult visits:  None since 06/24/20  Hospital visits:  None in previous 6 months  Medications: Outpatient Encounter Medications as of 08/27/2020  Medication Sig   aspirin 81 MG EC tablet aspirin 81 mg tablet,delayed release  TAKE 1 TABLET BY MOUTH TWICE A DAY   atenolol (TENORMIN) 25 MG tablet TAKE 1 TABLET BY MOUTH EVERY DAY   esomeprazole (NEXIUM) 40 MG capsule Take 1 capsule (40 mg total) by mouth daily at 12 noon.   hydrochlorothiazide (HYDRODIURIL) 12.5 MG tablet TAKE 1 TABLET BY MOUTH EVERY DAY WITH LOSARTAN TABLET   ibuprofen (ADVIL,MOTRIN) 200 MG tablet Take 800 mg by mouth every 6 (six) hours as needed.   irbesartan-hydrochlorothiazide (AVALIDE) 300-12.5 MG tablet TAKE 1 TABLET BY MOUTH DAILY IN THE MORNING AS NEEDED FOR BLOOD PRESSURE   losartan (COZAAR) 100 MG tablet TAKE 1 TABLET BY MOUTH EVERY DAY   pravastatin (PRAVACHOL) 20 MG tablet TAKE 1 TABLET BY MOUTH EVERY DAY- NEEDS APPT FOR REFILLS   sildenafil (VIAGRA) 100 MG tablet TAKE 0.5-1 TABLETS (50-100 MG TOTAL) BY MOUTH DAILY AS NEEDED FOR ERECTILE DYSFUNCTION.   No facility-administered encounter medications on file as of 08/27/2020.    Reviewed chart prior to disease state call. Spoke with patient regarding BP  Recent Office Vitals: BP Readings from Last 3 Encounters:  06/29/20 132/84  10/23/19 (!) 144/86  02/18/19 130/74   Pulse Readings from Last 3 Encounters:  06/29/20 60  10/23/19  71  02/18/19 80    Wt Readings from Last 3 Encounters:  06/29/20 219 lb (99.3 kg)  10/23/19 214 lb (97.1 kg)  02/18/19 213 lb (96.6 kg)     Kidney Function Lab Results  Component Value Date/Time   CREATININE 1.17 06/29/2020 10:05 AM   CREATININE 1.18 10/23/2019 04:27 PM   GFRNONAA 62 06/29/2020 10:05 AM   GFRAA 72 06/29/2020 10:05 AM    BMP Latest Ref Rng & Units 06/29/2020 10/23/2019 05/28/2018  Glucose 65 - 99 mg/dL 93 120(H) 90  BUN 7 - 25 mg/dL 18 14 13   Creatinine 0.70 - 1.18 mg/dL 1.17 1.18 1.21(H)  BUN/Creat Ratio 6 - 22 (calc) NOT APPLICABLE NOT APPLICABLE 11  Sodium 382 - 146 mmol/L 138 137 136  Potassium 3.5 - 5.3 mmol/L 4.0 3.6 4.2  Chloride 98 - 110 mmol/L 98 98 97(L)  CO2 20 - 32 mmol/L 29 29 31   Calcium 8.6 - 10.3 mg/dL 9.4 9.5 9.4    Current antihypertensive regimen:  Atenolol 25 mg 1 tablet daily Hydrochlorothiazide 12.5 mg 1 tablet daily  Losartan 100 mg  1 tablet daily   How often are you checking your Blood Pressure? when feeling symptomatic   Current home BP readings: Patient stated he does not check his blood pressure regularly.   What recent interventions/DTPs have been made by any provider to improve Blood Pressure control since last CPP Visit: None.   Any recent hospitalizations or ED visits since last visit with CPP? Patient stated no.  What diet changes  have been made to improve Blood Pressure Control?  Patient stated he drinks green tea, salads, yogurt. He stated he stays away fried foods.  What exercise is being done to improve your Blood Pressure Control?  Patient stated he plays golf, works 4 days a week, works outside in the yard and on his car.   Adherence Review: Is the patient currently on ACE/ARB medication? Losartan 100 mg   Does the patient have >5 day gap between last estimated fill dates? Per misc rpts, no.  Star Rating Drugs:  Losartan 100 mg 90 06/25/20 Pravastatin 20 mg 90 DS 07/11/20  Follow-Up:Pharmacist  Review   Charlann Lange, RMA Clinical Pharmacist Assistant [phone]  10 minutes spent in review, coordination, and documentation.  Reviewed by: Beverly Milch, PharmD Clinical Pharmacist [facility] [phone]

## 2020-09-07 ENCOUNTER — Telehealth: Payer: Self-pay | Admitting: Family Medicine

## 2020-09-07 MED ORDER — OMEPRAZOLE 40 MG PO CPDR: 40.0000 mg | DELAYED_RELEASE_CAPSULE | Freq: Every day | ORAL | 3 refills | Status: DC

## 2020-09-07 NOTE — Telephone Encounter (Signed)
Pt called stating that he needs a refill of  esomeprazole (NEXIUM) 40 MG capsule [[id number]]  Sent to pharmacy. Pt said there may have been some miscommunication with this particular med, pt stated he did not want to replace this med with something different. Please call to verify with pt.  Cb#: [id number]

## 2020-09-07 NOTE — Telephone Encounter (Signed)
Call placed to patient.   States that he is not taking "Nexium". States that he is taking Omeprazole 40mg .   Verified prescription and medication via pharmacy.

## 2020-09-24 ENCOUNTER — Other Ambulatory Visit: Payer: Self-pay | Admitting: *Deleted

## 2020-09-24 MED ORDER — LOSARTAN POTASSIUM 100 MG PO TABS: 1.0000 | ORAL_TABLET | Freq: Every day | ORAL | 1 refills | Status: DC

## 2020-10-08 ENCOUNTER — Other Ambulatory Visit: Payer: Self-pay | Admitting: Family Medicine

## 2020-12-28 ENCOUNTER — Telehealth: Payer: Self-pay

## 2021-01-03 ENCOUNTER — Telehealth: Payer: Self-pay

## 2021-01-03 NOTE — Telephone Encounter (Signed)
Please schedule OV; if with me virtual visit please

## 2021-01-03 NOTE — Telephone Encounter (Signed)
Pt called in stating that he tested positive for Covid 2 days ago and is experiencing symptoms of fatigue, coughing, sore throat and body aches. Pt was wanting to know what he could take for these symptoms, and if he could get something sent to the pharmacy for this. Please advise.  Cb#: [id number]

## 2021-01-03 NOTE — Telephone Encounter (Signed)
Please advise 

## 2021-01-10 ENCOUNTER — Telehealth: Payer: Self-pay | Admitting: Pharmacist

## 2021-01-10 NOTE — Progress Notes (Signed)
Chronic Care Management Pharmacy Assistant   Name: Keith Pratt  MRN: [id number] DOB: [birth year]  Reason for Encounter: Disease State For HTN.    Conditions to be addressed/monitored: HTN,HLD.  Recent office visits:  None since 08/27/20  Recent consult visits:  None since 08/27/20  Hospital visits:  None since 08/27/20  Medications: Outpatient Encounter Medications as of 01/10/2021  Medication Sig   aspirin 81 MG EC tablet aspirin 81 mg tablet,delayed release  TAKE 1 TABLET BY MOUTH TWICE A DAY   atenolol (TENORMIN) 25 MG tablet TAKE 1 TABLET BY MOUTH EVERY DAY   hydrochlorothiazide (HYDRODIURIL) 12.5 MG tablet TAKE 1 TABLET BY MOUTH EVERY DAY WITH LOSARTAN TABLET   ibuprofen (ADVIL,MOTRIN) 200 MG tablet Take 800 mg by mouth every 6 (six) hours as needed.   irbesartan-hydrochlorothiazide (AVALIDE) 300-12.5 MG tablet TAKE 1 TABLET BY MOUTH DAILY IN THE MORNING AS NEEDED FOR BLOOD PRESSURE   losartan (COZAAR) 100 MG tablet Take 1 tablet (100 mg total) by mouth daily.   omeprazole (PRILOSEC) 40 MG capsule Take 1 capsule (40 mg total) by mouth daily.   pravastatin (PRAVACHOL) 20 MG tablet TAKE 1 TABLET BY MOUTH EVERY DAY- NEEDS APPT FOR REFILLS   sildenafil (VIAGRA) 100 MG tablet TAKE 0.5-1 TABLETS (50-100 MG TOTAL) BY MOUTH DAILY AS NEEDED FOR ERECTILE DYSFUNCTION.   No facility-administered encounter medications on file as of 01/10/2021.   Reviewed chart prior to disease state call. Spoke with patient regarding BP  Recent Office Vitals: BP Readings from Last 3 Encounters:  06/29/20 132/84  10/23/19 (!) 144/86  02/18/19 130/74   Pulse Readings from Last 3 Encounters:  06/29/20 60  10/23/19 71  02/18/19 80    Wt Readings from Last 3 Encounters:  06/29/20 219 lb (99.3 kg)  10/23/19 214 lb (97.1 kg)  02/18/19 213 lb (96.6 kg)     Kidney Function Lab Results  Component Value Date/Time   CREATININE 1.17 06/29/2020 10:05 AM   CREATININE 1.18 10/23/2019 04:27 PM    GFRNONAA 62 06/29/2020 10:05 AM   GFRAA 72 06/29/2020 10:05 AM    BMP Latest Ref Rng & Units 06/29/2020 10/23/2019 05/28/2018  Glucose 65 - 99 mg/dL 93 120(H) 90  BUN 7 - 25 mg/dL 18 14 13   Creatinine 0.70 - 1.18 mg/dL 1.17 1.18 1.21(H)  BUN/Creat Ratio 6 - 22 (calc) NOT APPLICABLE NOT APPLICABLE 11  Sodium 573 - 146 mmol/L 138 137 136  Potassium 3.5 - 5.3 mmol/L 4.0 3.6 4.2  Chloride 98 - 110 mmol/L 98 98 97(L)  CO2 20 - 32 mmol/L 29 29 31   Calcium 8.6 - 10.3 mg/dL 9.4 9.5 9.4    Current antihypertensive regimen:  Atenolol 25 mg 1 tablet daily Hydrochlorothiazide 12.5 mg 1 tablet daily  Losartan 100 mg  1 tablet daily   How often are you checking your Blood Pressure? Patient stated when feeling symptomatic  Current home BP readings: Patient stated he doesn't check his blood pressure regularly.   What recent interventions/DTPs have been made by any provider to improve Blood Pressure control since last CPP Visit: None.   Any recent hospitalizations or ED visits since last visit with CPP?   Patient stated no.  What diet changes have been made to improve Blood Pressure Control?  Patient stated his appetite is still good.   What exercise is being done to improve your Blood Pressure Control?  Patient stated he plays golf, works 4 days a week, works outside in the yard  and on his car.   Adherence Review: Is the patient currently on ACE/ARB medication? Losartan 100 mg  Does the patient have >5 day gap between last estimated fill dates? Per misc rpts, yes.   Care Gaps:None.   Star Rating Drugs:Pravastatin 20 mg 10/08/20 90 DS, Losartan 100 mg 12/23/20 90 DS.  Follow-Up:Pharmacist Review  Charlann Lange, [place] Pharmacist Assistant [phone]

## 2021-01-12 ENCOUNTER — Telehealth: Payer: Self-pay | Admitting: Family Medicine

## 2021-01-12 NOTE — Telephone Encounter (Signed)
Patient left voicemail message to follow up on request for call back; needs advice for managing COVID symptoms. Please advise at [phone].

## 2021-01-13 NOTE — Telephone Encounter (Signed)
Patient has appt on 01/17/21 with pcp.

## 2021-01-17 ENCOUNTER — Ambulatory Visit (INDEPENDENT_AMBULATORY_CARE_PROVIDER_SITE_OTHER): Payer: PPO | Admitting: Family Medicine

## 2021-01-17 ENCOUNTER — Encounter: Payer: Self-pay | Admitting: Family Medicine

## 2021-01-17 ENCOUNTER — Other Ambulatory Visit: Payer: Self-pay

## 2021-01-17 VITALS — BP 140/78 | HR 58 | Temp 98.9°F | Resp 16 | Ht 74.0 in | Wt 214.0 lb

## 2021-01-17 DIAGNOSIS — U071 COVID-19: Secondary | ICD-10-CM

## 2021-01-17 NOTE — Progress Notes (Signed)
Subjective:    Patient ID: Keith Pratt, male    DOB: Dec 19, 1950, 70 y.o.   MRN: [id number]  HPI patient states that he tested positive for COVID about 10 to 11 days ago.  Symptoms included chest congestion and sore throat and head congestion.  He tested negative for COVID a few days ago.  He mainly came in today just for an evaluation.  He denies any chest pain.  He denies any pleurisy.  He denies any hemoptysis.  He denies any fevers or chills.  He denies any leg swelling to suggest a DVT.  He denies any change in his sense of taste or smell.  He denies any nausea or vomiting or diarrhea. Past Medical History:  Diagnosis Date   AAA (abdominal aortic aneurysm)    3.5 cm   Anxiety    Arthritis    Cancer ([facility]CC)    basal cell CA-under left arm   Cataract    [facility]ia 06/29/2020   COPD (chronic obstructive pulmonary disease) ([place])    Hyperlipidemia    Hypertension    Shingles    July 2014   Thyroid disease    as a child- no problem as adult   Ulcer    Past Surgical History:  Procedure Laterality Date   CATARACT EXTRACTION     right eye   COLONOSCOPY     EYE SURGERY N/A    [facility]ia 06/29/2020   JOINT REPLACEMENT N/A    [facility] 06/29/2020   right arm surgery     TONSILLECTOMY     Current Outpatient Medications on File Prior to Visit  Medication Sig Dispense Refill   aspirin 81 MG EC tablet aspirin 81 mg tablet,delayed release  TAKE 1 TABLET BY MOUTH TWICE A DAY     atenolol (TENORMIN) 25 MG tablet TAKE 1 TABLET BY MOUTH EVERY DAY 90 tablet 2   hydrochlorothiazide (HYDRODIURIL) 12.5 MG tablet TAKE 1 TABLET BY MOUTH EVERY DAY WITH LOSARTAN TABLET 90 tablet 2   ibuprofen (ADVIL,MOTRIN) 200 MG tablet Take 800 mg by mouth every 6 (six) hours as needed.     losartan (COZAAR) 100 MG tablet Take 1 tablet (100 mg total) by mouth daily. 90 tablet 1   omeprazole (PRILOSEC) 40 MG capsule Take 1 capsule (40 mg total) by mouth daily. 90 capsule 3   pravastatin (PRAVACHOL) 20 MG  tablet TAKE 1 TABLET BY MOUTH EVERY DAY- NEEDS APPT FOR REFILLS 90 tablet 1   sildenafil (VIAGRA) 100 MG tablet TAKE 0.5-1 TABLETS (50-100 MG TOTAL) BY MOUTH DAILY AS NEEDED FOR ERECTILE DYSFUNCTION. 50 tablet 3   No current facility-administered medications on file prior to visit.   Allergies  Allergen Reactions   Cardura [Doxazosin Mesylate] Other (See Comments)    Dizzy, hot all over - per pt "jusr felt sick"   Codeine     REACTION: nauses and vomiting   Amlodipine Swelling    Swelling in feet and ankles   Social History   Socioeconomic History   Marital status: Divorced    Spouse name: Not on file   Number of children: Not on file   Years of education: Not on file   Highest education level: Not on file  Occupational History   Not on file  Tobacco Use   Smoking status: Former    Types: Cigarettes    Quit date: 01/08/2003    Years since quitting: 18.0   Smokeless tobacco: Never  Substance and Sexual Activity   Alcohol use:  No   Drug use: No   Sexual activity: Yes  Other Topics Concern   Not on file  Social History Narrative   Not on file   Social Determinants of Health   Financial Resource Strain: Not on file  Food Insecurity: Not on file  Transportation Needs: Not on file  Physical Activity: Not on file  Stress: Not on file  Social Connections: Not on file  Intimate Partner Violence: Not on file      Review of Systems  All other systems reviewed and are negative.     Objective:   Physical Exam Vitals reviewed.  Constitutional:      General: He is not in acute distress.    Appearance: He is well-developed. He is not diaphoretic.  HENT:     Head: Normocephalic and atraumatic.     Right Ear: External ear normal.     Left Ear: External ear normal.     Nose: Nose normal.  Eyes:     General: No scleral icterus.       Right eye: No discharge.        Left eye: No discharge.     Conjunctiva/sclera: Conjunctivae normal.     Pupils: Pupils are equal,  round, and reactive to light.  Neck:     Thyroid: No thyromegaly.     Vascular: No JVD.     Trachea: No tracheal deviation.  Cardiovascular:     Rate and Rhythm: Normal rate and regular rhythm.     Heart sounds: Normal heart sounds. No murmur heard.   No gallop.  Pulmonary:     Effort: Pulmonary effort is normal. No respiratory distress.     Breath sounds: Normal breath sounds. No stridor. No wheezing or rales.  Chest:     Chest wall: No tenderness.  Abdominal:     General: Bowel sounds are normal. There is no distension.     Palpations: Abdomen is soft. There is no mass.     Tenderness: There is no abdominal tenderness. There is no guarding or rebound.  Musculoskeletal:     Cervical back: Neck supple.  Lymphadenopathy:     Cervical: No cervical adenopathy.  Skin:    General: Skin is warm.     Coloration: Skin is not pale.     Findings: No erythema or rash.  Neurological:     Mental Status: He is alert and oriented to person, place, and time.     Cranial Nerves: No cranial nerve deficit.     Motor: No abnormal muscle tone.     Coordination: Coordination normal.     Deep Tendon Reflexes: Reflexes are normal and symmetric.  Psychiatric:        Behavior: Behavior normal.        Thought Content: Thought content normal.        Judgment: Judgment normal.          Assessment & Plan:  COVID Physical exam today is normal.  He is recovered completely with no adverse effects.  I see no indication for any chest x-ray or pulmonary function testing.  There is no evidence of any complication.  Follow-up as planned for his routine medical visits

## 2021-02-09 ENCOUNTER — Other Ambulatory Visit: Payer: Self-pay | Admitting: Family Medicine

## 2021-02-24 ENCOUNTER — Telehealth: Payer: PPO

## 2021-04-04 ENCOUNTER — Encounter: Payer: Self-pay | Admitting: Family Medicine

## 2021-04-04 ENCOUNTER — Ambulatory Visit (INDEPENDENT_AMBULATORY_CARE_PROVIDER_SITE_OTHER): Payer: PPO | Admitting: Family Medicine

## 2021-04-04 ENCOUNTER — Other Ambulatory Visit: Payer: Self-pay

## 2021-04-04 VITALS — BP 182/78 | HR 64 | Resp 18 | Ht 74.0 in | Wt 216.0 lb

## 2021-04-04 DIAGNOSIS — R062 Wheezing: Secondary | ICD-10-CM

## 2021-04-04 DIAGNOSIS — I10 Essential (primary) hypertension: Secondary | ICD-10-CM | POA: Diagnosis not present

## 2021-04-04 DIAGNOSIS — F418 Other specified anxiety disorders: Secondary | ICD-10-CM | POA: Diagnosis not present

## 2021-04-04 MED ORDER — ALPRAZOLAM 0.5 MG PO TABS: 0.5000 mg | ORAL_TABLET | Freq: Three times a day (TID) | ORAL | 0 refills | Status: DC | PRN

## 2021-04-04 MED ORDER — ALBUTEROL SULFATE HFA 108 (90 BASE) MCG/ACT IN AERS: 2.0000 | INHALATION_SPRAY | Freq: Four times a day (QID) | RESPIRATORY_TRACT | 2 refills | Status: DC | PRN

## 2021-04-04 NOTE — Progress Notes (Signed)
Subjective:    Patient ID: Keith Pratt, male    DOB: Aug 16, 1950, 70 y.o.   MRN: [id number]  HPI   Patient has 2 concerns.  First he has a history of "COPD" in his chart.  This was mentioned on the chest x-ray that showed mild hyperinflation consistent with COPD in 2017.  He does have a 50-pack-year history of smoking however he quit approximately 15 years ago.  However he states that he can hear himself wheezing at night.  He denies any dyspnea on exertion or chest pain or hemoptysis or pleurisy.  He states that the wheezing at night has been noticeable on occasion for several weeks.  Today on exam, he has no wheezing on exam.  Breath sounds are relatively normal with no crackles or rails.  He denies any shortness of.  His blood pressure is extremely high today and I verified this personally.  However his landlord recently got.  He has been living in a house for 18 years.  However this house is now inherited by other members of the family and he is in jeopardy of possibly being evicted.  This has been extremely anxious and is having a difficult time sleeping.  I feel that this is likely contributing to his blood pressure.  He was here in October and his blood pressure was acceptable.  He has not been checking his blood pressure since.  Past Medical History:  Diagnosis Date   AAA (abdominal aortic aneurysm)    3.5 cm   Anxiety    Arthritis    Cancer (HCC)    basal cell CA-under left arm   Cataract    [facility]reesia 06/29/2020   COPD (chronic obstructive pulmonary disease) ([place])    Hyperlipidemia    Hypertension    Shingles    July 2014   Thyroid disease    as a child- no problem as adult   Ulcer    Past Surgical History:  Procedure Laterality Date   CATARACT EXTRACTION     right eye   COLONOSCOPY     EYE SURGERY N/A    [facility]reesia 06/29/2020   JOINT REPLACEMENT N/A    [facility]reesia 06/29/2020   right arm surgery     TONSILLECTOMY     Current Outpatient Medications on File Prior to  Visit  Medication Sig Dispense Refill   aspirin 81 MG EC tablet aspirin 81 mg tablet,delayed release  TAKE 1 TABLET BY MOUTH TWICE A DAY     atenolol (TENORMIN) 25 MG tablet TAKE 1 TABLET BY MOUTH EVERY DAY 90 tablet 2   hydrochlorothiazide (HYDRODIURIL) 12.5 MG tablet TAKE 1 TABLET BY MOUTH EVERY DAY WITH LOSARTAN TABLET 90 tablet 2   ibuprofen (ADVIL,MOTRIN) 200 MG tablet Take 800 mg by mouth every 6 (six) hours as needed.     losartan (COZAAR) 100 MG tablet Take 1 tablet (100 mg total) by mouth daily. 90 tablet 1   omeprazole (PRILOSEC) 40 MG capsule Take 1 capsule (40 mg total) by mouth daily. 90 capsule 3   pravastatin (PRAVACHOL) 20 MG tablet TAKE 1 TABLET BY MOUTH EVERY DAY- NEEDS APPT FOR REFILLS 90 tablet 1   sildenafil (VIAGRA) 100 MG tablet TAKE 0.5-1 TABLETS (50-100 MG TOTAL) BY MOUTH DAILY AS NEEDED FOR ERECTILE DYSFUNCTION. 50 tablet 3   No current facility-administered medications on file prior to visit.   Allergies  Allergen Reactions   Cardura [Doxazosin Mesylate] Other (See Comments)    Dizzy, hot all over - per  pt "jusr felt sick"   Codeine     REACTION: nauses and vomiting   Amlodipine Swelling    Swelling in feet and ankles   Social History   Socioeconomic History   Marital status: Divorced    Spouse name: Not on file   Number of children: Not on file   Years of education: Not on file   Highest education level: Not on file  Occupational History   Not on file  Tobacco Use   Smoking status: Former    Types: Cigarettes    Quit date: 01/08/2003    Years since quitting: 18.2   Smokeless tobacco: Never  Substance and Sexual Activity   Alcohol use: No   Drug use: No   Sexual activity: Yes  Other Topics Concern   Not on file  Social History Narrative   Not on file   Social Determinants of Health   Financial Resource Strain: Not on file  Food Insecurity: Not on file  Transportation Needs: Not on file  Physical Activity: Not on file  Stress: Not on file   Social Connections: Not on file  Intimate Partner Violence: Not on file      Review of Systems  All other systems reviewed and are negative.     Objective:   Physical Exam Vitals reviewed.  Constitutional:      General: He is not in acute distress.    Appearance: He is well-developed. He is not diaphoretic.  HENT:     Head: Normocephalic and atraumatic.     Right Ear: External ear normal.     Left Ear: External ear normal.     Nose: Nose normal.  Eyes:     General: No scleral icterus.       Right eye: No discharge.        Left eye: No discharge.     Conjunctiva/sclera: Conjunctivae normal.     Pupils: Pupils are equal, round, and reactive to light.  Neck:     Thyroid: No thyromegaly.     Vascular: No JVD.     Trachea: No tracheal deviation.  Cardiovascular:     Rate and Rhythm: Normal rate and regular rhythm.     Heart sounds: Normal heart sounds. No murmur heard.   No gallop.  Pulmonary:     Effort: Pulmonary effort is normal. No respiratory distress.     Breath sounds: Normal breath sounds. No stridor. No wheezing or rales.  Chest:     Chest wall: No tenderness.  Abdominal:     General: Bowel sounds are normal. There is no distension.     Palpations: Abdomen is soft. There is no mass.     Tenderness: There is no abdominal tenderness. There is no guarding or rebound.  Musculoskeletal:     Cervical back: Neck supple.  Lymphadenopathy:     Cervical: No cervical adenopathy.  Skin:    General: Skin is warm.     Coloration: Skin is not pale.     Findings: No erythema or rash.  Neurological:     Mental Status: He is alert and oriented to person, place, and time.     Cranial Nerves: No cranial nerve deficit.     Motor: No abnormal muscle tone.     Coordination: Coordination normal.     Deep Tendon Reflexes: Reflexes are normal and symmetric.  Psychiatric:        Behavior: Behavior normal.        Thought Content: Thought content  normal.        Judgment:  Judgment normal.          Assessment & Plan:  Wheezing  Situational anxiety  Benign essential HTN We discussed referring the patient for pulmonary function testing.  I believe he likely does have some mild COPD and would benefit from having an albuterol inhaler to be used sparingly as needed.  However at the present time he is asymptomatic and has not had any COPD exacerbation and therefore I do not feel the triple therapy is warranted.  The patient does not want a referral for pulmonary function testing.  Instead he would like to try albuterol to be used sparingly on an as-needed basis.  If his breathing worsens, I would recommend pulmonary function testing.  Regarding the situational anxiety, I feel that this is likely contributing to his blood pressure.  I will give him Xanax 0.5 mg tablets 1 p.o. every 6 hours as needed anxiety or insomnia.  I recommended that he use this sparingly to avoid habituation and dependency.  I recommended that he come in later this week so that we can recheck blood pressure.  If consistently greater than 140/90 I would add additional medication.

## 2021-04-13 ENCOUNTER — Telehealth: Payer: Self-pay

## 2021-04-13 ENCOUNTER — Other Ambulatory Visit: Payer: Self-pay | Admitting: Family Medicine

## 2021-04-13 NOTE — Telephone Encounter (Signed)
Pt called to ask about refill for ED. He states he had talked with you at [facility] about this, he isn't sure if you were going to send in Viagra or Cialis. [facility].  Please advise, thanks!

## 2021-04-14 ENCOUNTER — Other Ambulatory Visit: Payer: Self-pay | Admitting: Family Medicine

## 2021-04-14 MED ORDER — SILDENAFIL CITRATE 100 MG PO TABS: ORAL_TABLET | ORAL | 3 refills | Status: DC

## 2021-04-14 NOTE — Telephone Encounter (Signed)
Pt advised of rx. Nothing further needed at this time.

## 2021-05-05 ENCOUNTER — Other Ambulatory Visit: Payer: Self-pay | Admitting: Family Medicine

## 2021-05-06 NOTE — Telephone Encounter (Signed)
Xanax refill request.  Last seen 04/04/2021, last filled 04/04/2021.

## 2021-06-01 ENCOUNTER — Other Ambulatory Visit: Payer: Self-pay | Admitting: Family Medicine

## 2021-06-02 NOTE — Telephone Encounter (Signed)
LOV 04/04/21 Last refill 05/06/21, #30, 0 refills  Please review, thanks!

## 2021-07-03 ENCOUNTER — Other Ambulatory Visit: Payer: Self-pay | Admitting: Family Medicine

## 2021-07-08 ENCOUNTER — Other Ambulatory Visit: Payer: Self-pay | Admitting: Family Medicine

## 2021-07-08 NOTE — Telephone Encounter (Signed)
LOV 04/04/21 ?Last refill 06/02/21, #30, 0 refills ? ?Please review, thanks! ? ?

## 2021-08-09 ENCOUNTER — Other Ambulatory Visit: Payer: Self-pay | Admitting: Family Medicine

## 2021-08-09 NOTE — Telephone Encounter (Signed)
LOV 04/04/21 ?Last refill 07/08/21, #30, 0 refills ? ?Please review, thanks! ? ?

## 2021-08-26 ENCOUNTER — Ambulatory Visit: Payer: PPO | Admitting: Family Medicine

## 2021-08-26 ENCOUNTER — Ambulatory Visit (INDEPENDENT_AMBULATORY_CARE_PROVIDER_SITE_OTHER): Payer: PPO | Admitting: Family Medicine

## 2021-08-26 VITALS — BP 152/90 | HR 58 | Temp 97.4°F | Ht 74.0 in | Wt 205.0 lb

## 2021-08-26 DIAGNOSIS — Z125 Encounter for screening for malignant neoplasm of prostate: Secondary | ICD-10-CM | POA: Diagnosis not present

## 2021-08-26 DIAGNOSIS — I10 Essential (primary) hypertension: Secondary | ICD-10-CM

## 2021-08-26 DIAGNOSIS — Z0000 Encounter for general adult medical examination without abnormal findings: Secondary | ICD-10-CM

## 2021-08-26 MED ORDER — HYDROCHLOROTHIAZIDE 25 MG PO TABS: 25.0000 mg | ORAL_TABLET | Freq: Every day | ORAL | 3 refills | Status: DC

## 2021-08-26 NOTE — Progress Notes (Signed)
? ?Subjective:  ? ? Patient ID: Keith Pratt, male    DOB: 05/17/1950, 71 y.o.   MRN: [id number] ? ?HPI  ?Patient is here today for complete physical exam.  He has a history of hypertension.  His blood pressure today is extremely high.  I repeated the blood pressure and found his systolic blood pressure to be greater than 170.  He denies any chest pain or shortness of breath but he has not been checking his blood pressure.  His colonoscopy is due next year.  He is due for a PSA.  He is due for the shingles vaccine.  His pneumonia vaccines are up-to-date.  His tetanus shot is due next year.  He denies any concerns such as memory loss, falls, or depression ?Past Medical History:  ?Diagnosis Date  ? AAA (abdominal aortic aneurysm)   ? 3.5 cm  ? Anxiety   ? Arthritis   ? Cancer [facility])   ? basal cell CA-under left arm  ? Cataract   ? [facility]ia 06/29/2020  ? COPD (chronic obstructive pulmonary disease) ([place])   ? Hyperlipidemia   ? Hypertension   ? Shingles   ? July 2014  ? Thyroid disease   ? as a child- no problem as adult  ? Ulcer   ? ?Past Surgical History:  ?Procedure Laterality Date  ? CATARACT EXTRACTION    ? right eye  ? COLONOSCOPY    ? EYE SURGERY N/A   ? [facility]reesia 06/29/2020  ? JOINT REPLACEMENT N/A   ? [facility]ia 06/29/2020  ? right arm surgery    ? TONSILLECTOMY    ? ?Current Outpatient Medications on File Prior to Visit  ?Medication Sig Dispense Refill  ? albuterol (VENTOLIN HFA) 108 (90 Base) MCG/ACT inhaler Inhale 2 puffs into the lungs every 6 (six) hours as needed for wheezing or shortness of breath. 8 g 2  ? ALPRAZolam (XANAX) 0.5 MG tablet TAKE 1 TABLET BY MOUTH 3 (THREE) TIMES DAILY AS NEEDED FOR ANXIETY OR SLEEP. 30 tablet 0  ? aspirin 81 MG EC tablet aspirin 81 mg tablet,delayed release ? TAKE 1 TABLET BY MOUTH TWICE A DAY    ? atenolol (TENORMIN) 25 MG tablet TAKE 1 TABLET BY MOUTH EVERY DAY 90 tablet 2  ? ibuprofen (ADVIL,MOTRIN) 200 MG tablet Take 800 mg by mouth every 6 (six) hours as needed.     ? losartan (COZAAR) 100 MG tablet TAKE 1 TABLET BY MOUTH EVERY DAY 90 tablet 1  ? omeprazole (PRILOSEC) 40 MG capsule TAKE 1 CAPSULE (40 MG TOTAL) BY MOUTH DAILY. 90 capsule 3  ? pravastatin (PRAVACHOL) 20 MG tablet Take 1 tablet (20 mg total) by mouth daily. NEEDS OV FOR FURTHER REFILLS 90 tablet 0  ? sildenafil (VIAGRA) 100 MG tablet TAKE 0.5-1 TABLETS (50-100 MG TOTAL) BY MOUTH DAILY AS NEEDED FOR ERECTILE DYSFUNCTION. 50 tablet 3  ? ?No current facility-administered medications on file prior to visit.  ? ?Allergies  ?Allergen Reactions  ? Cardura [Doxazosin Mesylate] Other (See Comments)  ?  Dizzy, hot all over - per pt "jusr felt sick"  ? Codeine   ?  REACTION: nauses and vomiting  ? Amlodipine Swelling  ?  Swelling in feet and ankles  ? ?Social History  ? ?Socioeconomic History  ? Marital status: Divorced  ?  Spouse name: Not on file  ? Number of children: Not on file  ? Years of education: Not on file  ? Highest education level: Not on file  ?Occupational  History  ? Not on file  ?Tobacco Use  ? Smoking status: Former  ?  Types: Cigarettes  ?  Quit date: 01/08/2003  ?  Years since quitting: 18.6  ? Smokeless tobacco: Never  ?Substance and Sexual Activity  ? Alcohol use: No  ? Drug use: No  ? Sexual activity: Yes  ?Other Topics Concern  ? Not on file  ?Social History Narrative  ? Not on file  ? ?Social Determinants of Health  ? ?Financial Resource Strain: Not on file  ?Food Insecurity: Not on file  ?Transportation Needs: Not on file  ?Physical Activity: Not on file  ?Stress: Not on file  ?Social Connections: Not on file  ?Intimate Partner Violence: Not on file  ? ? ? ? ?Review of Systems  ?All other systems reviewed and are negative. ? ?   ?Objective:  ? Physical Exam ?Vitals reviewed.  ?Constitutional:   ?   General: He is not in acute distress. ?   Appearance: He is well-developed. He is not diaphoretic.  ?HENT:  ?   Head: Normocephalic and atraumatic.  ?   Right Ear: External ear normal.  ?   Left Ear:  External ear normal.  ?   Nose: Nose normal.  ?Eyes:  ?   General: No scleral icterus.    ?   Right eye: No discharge.     ?   Left eye: No discharge.  ?   Conjunctiva/sclera: Conjunctivae normal.  ?   Pupils: Pupils are equal, round, and reactive to light.  ?Neck:  ?   Thyroid: No thyromegaly.  ?   Vascular: No JVD.  ?   Trachea: No tracheal deviation.  ?Cardiovascular:  ?   Rate and Rhythm: Normal rate and regular rhythm.  ?   Heart sounds: Normal heart sounds. No murmur heard. ?  No gallop.  ?Pulmonary:  ?   Effort: Pulmonary effort is normal. No respiratory distress.  ?   Breath sounds: Normal breath sounds. No stridor. No wheezing or rales.  ?Chest:  ?   Chest wall: No tenderness.  ?Abdominal:  ?   General: Bowel sounds are normal. There is no distension.  ?   Palpations: Abdomen is soft. There is no mass.  ?   Tenderness: There is no abdominal tenderness. There is no guarding or rebound.  ?Musculoskeletal:  ?   Cervical back: Neck supple.  ?Lymphadenopathy:  ?   Cervical: No cervical adenopathy.  ?Skin: ?   General: Skin is warm.  ?   Coloration: Skin is not pale.  ?   Findings: No erythema or rash.  ?Neurological:  ?   Mental Status: He is alert and oriented to person, place, and time.  ?   Cranial Nerves: No cranial nerve deficit.  ?   Motor: No abnormal muscle tone.  ?   Coordination: Coordination normal.  ?   Deep Tendon Reflexes: Reflexes are normal and symmetric.  ?Psychiatric:     ?   Behavior: Behavior normal.     ?   Thought Content: Thought content normal.     ?   Judgment: Judgment normal.  ? ? ? ? ? ?   ?Assessment & Plan:  ?Benign essential HTN - Plan: CBC with Differential/Platelet, Lipid panel, COMPLETE METABOLIC PANEL WITH GFR ? ?Prostate cancer screening - Plan: PSA ? ?General medical exam - Plan: CBC with Differential/Platelet, Lipid panel, COMPLETE METABOLIC PANEL WITH GFR, PSA ?Recommended that the patient return fasting for lab work including a  PSA to screen for prostate cancer.  Increase  hydrochlorothiazide to 25 mg a day and recheck blood pressure in 2 weeks.  Return fasting for CBC, CMP, and fasting lipid panel.  Goal LDL cholesterol is less than 100.  Recommended the shingles vaccine.  Tetanus shot is due next year.  Pneumonia vaccines are up-to-date. ?

## 2021-09-09 ENCOUNTER — Other Ambulatory Visit: Payer: Self-pay | Admitting: Family Medicine

## 2021-09-13 NOTE — Telephone Encounter (Signed)
LOV 08/26/21 Last refill 08/09/21, #30, 0 refills  Please review, thanks!

## 2021-09-13 NOTE — Telephone Encounter (Signed)
Requested medication (s) are due for refill today - yes  Requested medication (s) are on the active medication list -yes  Future visit scheduled -no  Last refill: 08/09/21 #30  Notes to clinic: non delegated Rx  Requested Prescriptions  Pending Prescriptions Disp Refills   ALPRAZolam (XANAX) 0.5 MG tablet [Pharmacy Med Name: ALPRAZOLAM 0.5 MG TABLET] 30 tablet 0    Sig: TAKE 1 TABLET BY MOUTH 3 (THREE) TIMES DAILY AS NEEDED FOR ANXIETY OR SLEEP.     Not Delegated - Psychiatry: Anxiolytics/Hypnotics 2 Failed - 09/09/2021 10:09 PM      Failed - This refill cannot be delegated      Failed - Urine Drug Screen completed in last 360 days      Passed - Patient is not pregnant      Passed - Valid encounter within last 6 months    Recent Outpatient Visits           2 weeks ago Benign essential HTN   [place] Pickard, Cammie Mcgee, MD   5 months ago [place] Pickard, Cammie Mcgee, MD   7 months ago [place] Pickard, Cammie Mcgee, MD   1 year ago General medical exam   [place] Susy Frizzle, MD   1 year ago [place] Pickard, Cammie Mcgee, MD                  Requested Prescriptions  Pending Prescriptions Disp Refills   ALPRAZolam (XANAX) 0.5 MG tablet [Pharmacy Med Name: ALPRAZOLAM 0.5 MG TABLET] 30 tablet 0    Sig: TAKE 1 TABLET BY MOUTH 3 (THREE) TIMES DAILY AS NEEDED FOR ANXIETY OR SLEEP.     Not Delegated - Psychiatry: Anxiolytics/Hypnotics 2 Failed - 09/09/2021 10:09 PM      Failed - This refill cannot be delegated      Failed - Urine Drug Screen completed in last 360 days      Passed - Patient is not pregnant      Passed - Valid encounter within last 6 months    Recent Outpatient Visits           2 weeks ago Benign essential HTN   [place] Pickard, Cammie Mcgee, MD   5 months ago [place]  Pickard, Cammie Mcgee, MD   7 months ago [place], Cammie Mcgee, MD   1 year ago General medical exam   [place] Susy Frizzle, MD   1 year ago [place], Cammie Mcgee, MD

## 2021-09-15 ENCOUNTER — Other Ambulatory Visit: Payer: Self-pay | Admitting: Family Medicine

## 2021-09-15 ENCOUNTER — Other Ambulatory Visit: Payer: PPO

## 2021-09-15 DIAGNOSIS — Z0000 Encounter for general adult medical examination without abnormal findings: Secondary | ICD-10-CM | POA: Diagnosis not present

## 2021-09-15 DIAGNOSIS — I10 Essential (primary) hypertension: Secondary | ICD-10-CM | POA: Diagnosis not present

## 2021-09-15 DIAGNOSIS — Z125 Encounter for screening for malignant neoplasm of prostate: Secondary | ICD-10-CM | POA: Diagnosis not present

## 2021-09-16 LAB — LIPID PANEL
Cholesterol: 141 mg/dL (ref <200)
HDL: 34 mg/dL — ABNORMAL LOW (ref >=40)
LDL Cholesterol (Calc): 83 mg/dL (calc)
Non-HDL Cholesterol (Calc): 107 mg/dL (calc) (ref <130)
Total CHOL/HDL Ratio: 4.1 (calc) (ref <5.0)
Triglycerides: 141 mg/dL (ref <150)

## 2021-09-16 LAB — CBC WITH DIFFERENTIAL/PLATELET
Absolute Monocytes: 540 cells/uL (ref 200–950)
Basophils Absolute: 91 cells/uL (ref 0–200)
Basophils Relative: 1.2 %
Eosinophils Absolute: 509 cells/uL — ABNORMAL HIGH (ref 15–500)
Eosinophils Relative: 6.7 %
HCT: 42.7 % (ref 38.5–50.0)
Hemoglobin: 14.6 g/dL (ref 13.2–17.1)
Lymphs Abs: 2744 cells/uL (ref 850–3900)
MCH: 30 pg (ref 27.0–33.0)
MCHC: 34.2 g/dL (ref 32.0–36.0)
MCV: 87.9 fL (ref 80.0–100.0)
MPV: 10.1 fL (ref 7.5–12.5)
Monocytes Relative: 7.1 %
Neutro Abs: 3716 cells/uL (ref 1500–7800)
Neutrophils Relative %: 48.9 %
Platelets: 236 10*3/uL (ref 140–400)
RBC: 4.86 10*6/uL (ref 4.20–5.80)
RDW: 12.8 % (ref 11.0–15.0)
Total Lymphocyte: 36.1 %
WBC: 7.6 10*3/uL (ref 3.8–10.8)

## 2021-09-16 LAB — COMPLETE METABOLIC PANEL WITH GFR
AG Ratio: 1.6 (calc) (ref 1.0–2.5)
ALT: 18 U/L (ref 9–46)
AST: 19 U/L (ref 10–35)
Albumin: 4.2 g/dL (ref 3.6–5.1)
Alkaline phosphatase (APISO): 61 U/L (ref 35–144)
BUN: 15 mg/dL (ref 7–25)
CO2: 30 mmol/L (ref 20–32)
Calcium: 9.5 mg/dL (ref 8.6–10.3)
Chloride: 99 mmol/L (ref 98–110)
Creat: 1.12 mg/dL (ref 0.70–1.28)
Globulin: 2.6 g/dL (calc) (ref 1.9–3.7)
Glucose, Bld: 108 mg/dL — ABNORMAL HIGH (ref 65–99)
Potassium: 4.1 mmol/L (ref 3.5–5.3)
Sodium: 140 mmol/L (ref 135–146)
Total Bilirubin: 0.6 mg/dL (ref 0.2–1.2)
Total Protein: 6.8 g/dL (ref 6.1–8.1)
eGFR: 69 mL/min/{1.73_m2} (ref >=60)

## 2021-09-16 LAB — PSA: PSA: 3.29 ng/mL (ref <=4.00)

## 2021-10-11 ENCOUNTER — Other Ambulatory Visit: Payer: Self-pay | Admitting: Family Medicine

## 2021-10-12 NOTE — Telephone Encounter (Signed)
Requested medication (s) are due for refill today: yes  Requested medication (s) are on the active medication list: yes  Last refill:  09/13/21  Future visit scheduled: no  Notes to clinic:  Unable to refill per protocol, cannot delegate.      Requested Prescriptions  Pending Prescriptions Disp Refills   ALPRAZolam (XANAX) 0.5 MG tablet [Pharmacy Med Name: ALPRAZOLAM 0.5 MG TABLET] 30 tablet 0    Sig: TAKE 1 TABLET BY MOUTH 3 (THREE) TIMES DAILY AS NEEDED FOR ANXIETY OR SLEEP.     Not Delegated - Psychiatry: Anxiolytics/Hypnotics 2 Failed - 10/11/2021 11:00 PM      Failed - This refill cannot be delegated      Failed - Urine Drug Screen completed in last 360 days      Failed - Valid encounter within last 6 months    Recent Outpatient Visits           1 month ago Benign essential HTN   [place] [name], Cammie Mcgee, MD   6 months ago [place] [name], Cammie Mcgee, MD   8 months ago [place], Cammie Mcgee, MD   1 year ago General medical exam   [place] [facility] Susy Frizzle, MD   1 year ago [place], Cammie Mcgee, MD              Passed - Patient is not pregnant

## 2021-10-20 ENCOUNTER — Other Ambulatory Visit: Payer: Self-pay

## 2021-10-21 ENCOUNTER — Other Ambulatory Visit: Payer: Self-pay | Admitting: Family Medicine

## 2021-10-21 MED ORDER — ALPRAZOLAM 0.5 MG PO TABS: ORAL_TABLET | ORAL | 0 refills | Status: DC

## 2021-10-28 ENCOUNTER — Other Ambulatory Visit: Payer: Self-pay | Admitting: Family Medicine

## 2021-10-28 NOTE — Telephone Encounter (Signed)
Pt had OV 08/26/21, will refill medication.  Requested Prescriptions  Pending Prescriptions Disp Refills  . losartan (COZAAR) 100 MG tablet [Pharmacy Med Name: LOSARTAN POTASSIUM 100 MG TAB] 90 tablet 1    Sig: TAKE 1 TABLET BY MOUTH EVERY DAY     Cardiovascular:  Angiotensin Receptor Blockers Failed - 10/28/2021  9:48 AM      Failed - Last BP in normal range    BP Readings from Last 1 Encounters:  08/26/21 (!) 152/90         Failed - Valid encounter within last 6 months    Recent Outpatient Visits          2 months ago Benign essential HTN   [place] [name], Cammie Mcgee, MD   6 months ago [place] [name], Cammie Mcgee, MD   9 months ago [place] Pickard, Cammie Mcgee, MD   1 year ago General medical exam   [place] Susy Frizzle, MD   2 years ago [place] Pickard, Cammie Mcgee, MD             Passed - Cr in normal range and within 180 days    Creat  Date Value Ref Range Status  09/15/2021 1.12 0.70 - 1.28 mg/dL Final         Passed - K in normal range and within 180 days    Potassium  Date Value Ref Range Status  09/15/2021 4.1 3.5 - 5.3 mmol/L Final         Passed - Patient is not pregnant

## 2021-11-15 ENCOUNTER — Other Ambulatory Visit: Payer: Self-pay | Admitting: Family Medicine

## 2021-11-23 ENCOUNTER — Other Ambulatory Visit: Payer: Self-pay

## 2021-11-23 NOTE — Telephone Encounter (Signed)
LOV 04/04/21 Last refill 10/21/21, #30, 0 refills  Please review, thanks!

## 2021-11-24 ENCOUNTER — Other Ambulatory Visit: Payer: Self-pay | Admitting: Family Medicine

## 2021-11-24 MED ORDER — ALPRAZOLAM 0.5 MG PO TABS: ORAL_TABLET | ORAL | 0 refills | Status: DC

## 2021-12-23 ENCOUNTER — Other Ambulatory Visit: Payer: Self-pay | Admitting: Family Medicine

## 2021-12-23 ENCOUNTER — Other Ambulatory Visit: Payer: Self-pay

## 2021-12-23 DIAGNOSIS — E785 Hyperlipidemia, unspecified: Secondary | ICD-10-CM

## 2021-12-23 MED ORDER — PRAVASTATIN SODIUM 20 MG PO TABS: 20.0000 mg | ORAL_TABLET | Freq: Every day | ORAL | 3 refills | Status: DC

## 2021-12-23 NOTE — Telephone Encounter (Signed)
Requested medication (s) are due for refill today - yes  Requested medication (s) are on the active medication list -yes  Future visit scheduled -no  Last refill: 11/24/21   Notes to clinic: non delegated Rx  Requested Prescriptions  Pending Prescriptions Disp Refills   ALPRAZolam (XANAX) 0.5 MG tablet [Pharmacy Med Name: ALPRAZOLAM 0.5 MG TABLET] 30 tablet 0    Sig: TAKE 1 TABLET BY MOUTH 3 (THREE) TIMES DAILY AS NEEDED FOR ANXIETY OR SLEEP.     Not Delegated - Psychiatry: Anxiolytics/Hypnotics 2 Failed - 12/23/2021  7:53 AM      Failed - This refill cannot be delegated      Failed - Urine Drug Screen completed in last 360 days      Failed - Valid encounter within last 6 months    Recent Outpatient Visits           3 months ago Benign essential HTN   [place] [name], Cammie Mcgee, MD   8 months ago [place] [name], Cammie Mcgee, MD   11 months ago [place] Pickard, Cammie Mcgee, MD   1 year ago General medical exam   [place] Susy Frizzle, MD   2 years ago [place], Cammie Mcgee, MD              Passed - Patient is not pregnant         Requested Prescriptions  Pending Prescriptions Disp Refills   ALPRAZolam (XANAX) 0.5 MG tablet [Pharmacy Med Name: ALPRAZOLAM 0.5 MG TABLET] 30 tablet 0    Sig: TAKE 1 TABLET BY MOUTH 3 (THREE) TIMES DAILY AS NEEDED FOR ANXIETY OR SLEEP.     Not Delegated - Psychiatry: Anxiolytics/Hypnotics 2 Failed - 12/23/2021  7:53 AM      Failed - This refill cannot be delegated      Failed - Urine Drug Screen completed in last 360 days      Failed - Valid encounter within last 6 months    Recent Outpatient Visits           3 months ago Benign essential HTN   [place] [name], Cammie Mcgee, MD   8 months ago [place] [name], Cammie Mcgee, MD   11 months ago [place], Cammie Mcgee, MD   1 year ago General medical exam   [place] [facility] Susy Frizzle, MD   2 years ago [place], Cammie Mcgee, MD              Passed - Patient is not pregnant

## 2022-01-21 ENCOUNTER — Other Ambulatory Visit: Payer: Self-pay | Admitting: Family Medicine

## 2022-02-03 ENCOUNTER — Telehealth: Payer: Self-pay

## 2022-02-03 ENCOUNTER — Other Ambulatory Visit: Payer: Self-pay

## 2022-02-03 DIAGNOSIS — I10 Essential (primary) hypertension: Secondary | ICD-10-CM

## 2022-02-03 MED ORDER — LOSARTAN POTASSIUM 100 MG PO TABS: 100.0000 mg | ORAL_TABLET | Freq: Every day | ORAL | 3 refills | Status: DC

## 2022-02-03 NOTE — Telephone Encounter (Signed)
Received eFax from pharmacy to request refill of or new script for  losartan (COZAAR) 100 MG tablet [[id number]]    Order Details Dose, Route, Frequency: As Directed  Dispense Quantity: 90 tablet Refills: 0        Sig: TAKE 1 TABLET BY MOUTH EVERY DAY       Start Date: 10/28/21 End Date: --  Written Date: 10/28/21 Expiration Date: 10/28/22  Original Order:  losartan (COZAAR) 100 MG tablet [[id number]]  .  [id number] [place] [place] [address]  LOV: 08/26/21  Please advise pharmacist.

## 2022-02-21 ENCOUNTER — Other Ambulatory Visit: Payer: Self-pay | Admitting: Family Medicine

## 2022-02-22 NOTE — Telephone Encounter (Signed)
Requested medication (s) are due for refill today:   Provider to review  Requested medication (s) are on the active medication list:   Yes  Future visit scheduled:   No   Last ordered: 08/26/2021  Returned because it's a non delegated refill   Requested Prescriptions  Pending Prescriptions Disp Refills   ALPRAZolam (XANAX) 0.5 MG tablet [Pharmacy Med Name: ALPRAZOLAM 0.5 MG TABLET] 30 tablet 0    Sig: TAKE 1 TABLET BY MOUTH 3 (THREE) TIMES DAILY AS NEEDED FOR ANXIETY OR SLEEP.     Not Delegated - Psychiatry: Anxiolytics/Hypnotics 2 Failed - 02/21/2022  6:13 PM      Failed - This refill cannot be delegated      Failed - Urine Drug Screen completed in last 360 days      Failed - Valid encounter within last 6 months    Recent Outpatient Visits           6 months ago Benign essential HTN   [place] [name], Cammie Mcgee, MD   10 months ago [place] Pickard, Cammie Mcgee, MD   1 year ago [place], Cammie Mcgee, MD   1 year ago General medical exam   [place] [facility] Susy Frizzle, MD   2 years ago [place], Cammie Mcgee, MD              Passed - Patient is not pregnant

## 2022-03-24 ENCOUNTER — Other Ambulatory Visit: Payer: Self-pay | Admitting: Family Medicine

## 2022-04-25 ENCOUNTER — Other Ambulatory Visit: Payer: Self-pay | Admitting: Family Medicine

## 2022-04-26 NOTE — Telephone Encounter (Signed)
Requested medication (s) are due for refill today: yes  Requested medication (s) are on the active medication list: yes  Last refill:  03/24/22 #30  Future visit scheduled: no  Notes to clinic:  med not delegated to NT to reorder   Requested Prescriptions  Pending Prescriptions Disp Refills   ALPRAZolam (XANAX) 0.5 MG tablet [Pharmacy Med Name: ALPRAZOLAM 0.5 MG TABLET] 30 tablet 0    Sig: TAKE 1 TABLET BY MOUTH 3 (THREE) TIMES DAILY AS NEEDED FOR ANXIETY OR SLEEP.     Not Delegated - Psychiatry: Anxiolytics/Hypnotics 2 Failed - 04/25/2022 10:23 PM      Failed - This refill cannot be delegated      Failed - Urine Drug Screen completed in last 360 days      Failed - Valid encounter within last 6 months    Recent Outpatient Visits           8 months ago Benign essential HTN   [place] Pickard, Cammie Mcgee, MD   1 year ago [place] Pickard, Cammie Mcgee, MD   1 year ago [place], Cammie Mcgee, MD   1 year ago General medical exam   [place] [facility] Susy Frizzle, MD   2 years ago [place], Cammie Mcgee, MD              Passed - Patient is not pregnant

## 2022-05-25 ENCOUNTER — Other Ambulatory Visit: Payer: Self-pay | Admitting: Family Medicine

## 2022-05-26 NOTE — Telephone Encounter (Signed)
Requested medication (s) are due for refill today - yes  Requested medication (s) are on the active medication list -yes  Future visit scheduled -no  Last refill: 04/27/22 #30   Notes to clinic: non delegated Rx  Requested Prescriptions  Pending Prescriptions Disp Refills   ALPRAZolam (XANAX) 0.5 MG tablet [Pharmacy Med Name: ALPRAZOLAM 0.5 MG TABLET] 30 tablet 0    Sig: TAKE 1 TABLET BY MOUTH 3 (THREE) TIMES DAILY AS NEEDED FOR ANXIETY OR SLEEP.     Not Delegated - Psychiatry: Anxiolytics/Hypnotics 2 Failed - 05/25/2022 11:27 PM      Failed - This refill cannot be delegated      Failed - Urine Drug Screen completed in last 360 days      Failed - Valid encounter within last 6 months    Recent Outpatient Visits           9 months ago Benign essential HTN   [place] Pickard, Cammie Mcgee, MD   1 year ago [place] Pickard, Cammie Mcgee, MD   1 year ago [place] Pickard, Cammie Mcgee, MD   1 year ago General medical exam   [place] Susy Frizzle, MD   2 years ago [place], Cammie Mcgee, MD              Passed - Patient is not pregnant         Requested Prescriptions  Pending Prescriptions Disp Refills   ALPRAZolam (XANAX) 0.5 MG tablet [Pharmacy Med Name: ALPRAZOLAM 0.5 MG TABLET] 30 tablet 0    Sig: TAKE 1 TABLET BY MOUTH 3 (THREE) TIMES DAILY AS NEEDED FOR ANXIETY OR SLEEP.     Not Delegated - Psychiatry: Anxiolytics/Hypnotics 2 Failed - 05/25/2022 11:27 PM      Failed - This refill cannot be delegated      Failed - Urine Drug Screen completed in last 360 days      Failed - Valid encounter within last 6 months    Recent Outpatient Visits           9 months ago Benign essential HTN   [place] Pickard, Cammie Mcgee, MD   1 year ago [place] Pickard, Cammie Mcgee, MD   1 year ago [place], Cammie Mcgee, MD   1 year ago General medical exam   [place] [facility] Susy Frizzle, MD   2 years ago [place], Cammie Mcgee, MD              Passed - Patient is not pregnant

## 2022-06-26 ENCOUNTER — Other Ambulatory Visit: Payer: Self-pay | Admitting: Family Medicine

## 2022-07-21 ENCOUNTER — Ambulatory Visit (HOSPITAL_COMMUNITY)
Admission: RE | Admit: 2022-07-21 | Discharge: 2022-07-21 | Disposition: A | Discharge status: Home / Self Care | Payer: PPO | Source: Ambulatory Visit | Attending: Family Medicine | Admitting: Family Medicine

## 2022-07-21 ENCOUNTER — Ambulatory Visit (INDEPENDENT_AMBULATORY_CARE_PROVIDER_SITE_OTHER): Payer: PPO | Admitting: Family Medicine

## 2022-07-21 VITALS — BP 126/72 | HR 53 | Temp 98.5°F | Ht 74.0 in | Wt 205.8 lb

## 2022-07-21 DIAGNOSIS — M545 Low back pain, unspecified: Secondary | ICD-10-CM | POA: Diagnosis not present

## 2022-07-21 DIAGNOSIS — M5441 Lumbago with sciatica, right side: Secondary | ICD-10-CM

## 2022-07-21 DIAGNOSIS — M5442 Lumbago with sciatica, left side: Secondary | ICD-10-CM | POA: Diagnosis not present

## 2022-07-21 NOTE — Progress Notes (Signed)
Subjective:    Patient ID: Keith Pratt, male    DOB: 1951/03/15, 72 y.o.   MRN: [id number]  Back Pain    Patient reports aching pain in the center of his back that radiates into his pelvis.  It also radiates into both gluteus muscles.  It is an aching constant pain made worse with forward flexion.  He also reports numbness and tingling occasionally shooting down his legs.  He denies any saddle anesthesia or bowel or bladder incontinence.  He denies any fevers or chills.  He does report some aching pains in his knees and in his shoulders but he has a history of arthritis there.  Patient has palpable dorsalis pedis and posterior tibialis pulses of both feet.  There is no evidence of ischemia. Past Medical History:  Diagnosis Date   AAA (abdominal aortic aneurysm)    3.5 cm   Anxiety    Arthritis    Cancer    basal cell CA-under left arm   Cataract    [employer] 06/29/2020   COPD (chronic obstructive pulmonary disease)    Hyperlipidemia    Hypertension    Shingles    July 2014   Thyroid disease    as a child- no problem as adult   Ulcer    Past Surgical History:  Procedure Laterality Date   CATARACT EXTRACTION     right eye   COLONOSCOPY     EYE SURGERY N/A    [employer] 06/29/2020   JOINT REPLACEMENT N/A    [employer] 06/29/2020   right arm surgery     TONSILLECTOMY     Current Outpatient Medications on File Prior to Visit  Medication Sig Dispense Refill   albuterol (VENTOLIN HFA) 108 (90 Base) MCG/ACT inhaler Inhale 2 puffs into the lungs every 6 (six) hours as needed for wheezing or shortness of breath. 8 g 2   ALPRAZolam (XANAX) 0.5 MG tablet TAKE 1 TABLET BY MOUTH 3 (THREE) TIMES DAILY AS NEEDED FOR ANXIETY OR SLEEP. 30 tablet 0   aspirin 81 MG EC tablet aspirin 81 mg tablet,delayed release  TAKE 1 TABLET BY MOUTH TWICE A DAY     atenolol (TENORMIN) 25 MG tablet TAKE 1 TABLET BY MOUTH EVERY DAY 90 tablet 2   hydrochlorothiazide (HYDRODIURIL) 25 MG tablet Take 1  tablet (25 mg total) by mouth daily. 90 tablet 3   ibuprofen (ADVIL,MOTRIN) 200 MG tablet Take 800 mg by mouth every 6 (six) hours as needed.     losartan (COZAAR) 100 MG tablet Take 1 tablet (100 mg total) by mouth daily. 90 tablet 3   omeprazole (PRILOSEC) 40 MG capsule TAKE 1 CAPSULE (40 MG TOTAL) BY MOUTH DAILY. 90 capsule 3   pravastatin (PRAVACHOL) 20 MG tablet Take 1 tablet (20 mg total) by mouth daily. NEEDS OV FOR FURTHER REFILLS 90 tablet 3   sildenafil (VIAGRA) 100 MG tablet TAKE 0.5-1 TABLETS (50-100 MG TOTAL) BY MOUTH DAILY AS NEEDED FOR ERECTILE DYSFUNCTION. 50 tablet 3   No current facility-administered medications on file prior to visit.   Allergies  Allergen Reactions   Cardura [Doxazosin Mesylate] Other (See Comments)    Dizzy, hot all over - per pt "jusr felt sick"   Codeine     REACTION: nauses and vomiting   Amlodipine Swelling    Swelling in feet and ankles   Social History   Socioeconomic History   Marital status: Divorced    Spouse name: Not on file   Number of  children: Not on file   Years of education: Not on file   Highest education level: Associate degree: occupational, technical, or vocational program  Occupational History   Not on file  Tobacco Use   Smoking status: Former    Types: Cigarettes    Quit date: 01/08/2003    Years since quitting: 19.5   Smokeless tobacco: Never  Substance and Sexual Activity   Alcohol use: No   Drug use: No   Sexual activity: Yes  Other Topics Concern   Not on file  Social History Narrative   Not on file   Social Determinants of Health   Financial Resource Strain: Medium Risk (07/20/2022)   Overall Financial Resource Strain (CARDIA)    Difficulty of Paying Living Expenses: Somewhat hard  Food Insecurity: No Food Insecurity (07/20/2022)   Hunger Vital Sign    Worried About Running Out of Food in the Last Year: Never true    Ran Out of Food in the Last Year: Never true  Transportation Needs: No Transportation  Needs (07/20/2022)   PRAPARE - Administrator, Civil Service (Medical): No    Lack of Transportation (Non-Medical): No  Physical Activity: Sufficiently Active (07/20/2022)   Exercise Vital Sign    Days of Exercise per Week: 5 days    Minutes of Exercise per Session: 60 min  Stress: Stress Concern Present (07/20/2022)   [employer] of Occupational Health - Occupational Stress Questionnaire    Feeling of Stress : To some extent  Social Connections: Moderately Integrated (07/20/2022)   Social Connection and Isolation Panel [NHANES]    Frequency of Communication with Friends and Family: Twice a week    Frequency of Social Gatherings with Friends and Family: Three times a week    Attends Religious Services: 1 to 4 times per year    Active Member of Clubs or Organizations: Yes    Attends Banker Meetings: 1 to 4 times per year    Marital Status: Divorced  Catering manager Violence: Not on file      Review of Systems  Musculoskeletal:  Positive for back pain.  All other systems reviewed and are negative.      Objective:   Physical Exam Vitals reviewed.  Constitutional:      General: He is not in acute distress.    Appearance: He is well-developed. He is not diaphoretic.  HENT:     Head: Normocephalic and atraumatic.     Right Ear: External ear normal.     Left Ear: External ear normal.     Nose: Nose normal.  Eyes:     General: No scleral icterus.       Right eye: No discharge.        Left eye: No discharge.     Conjunctiva/sclera: Conjunctivae normal.     Pupils: Pupils are equal, round, and reactive to light.  Neck:     Thyroid: No thyromegaly.     Vascular: No JVD.     Trachea: No tracheal deviation.  Cardiovascular:     Rate and Rhythm: Normal rate and regular rhythm.     Heart sounds: Normal heart sounds. No murmur heard.    No gallop.  Pulmonary:     Effort: Pulmonary effort is normal. No respiratory distress.     Breath sounds: Normal  breath sounds. No stridor. No wheezing or rales.  Chest:     Chest wall: No tenderness.  Abdominal:     General: Bowel sounds  are normal. There is no distension.     Palpations: Abdomen is soft. There is no mass.     Tenderness: There is no abdominal tenderness. There is no guarding or rebound.  Musculoskeletal:     Cervical back: Neck supple.     Lumbar back: No deformity, spasms, tenderness or bony tenderness. Decreased range of motion. Negative right straight leg raise test and negative left straight leg raise test.     Right hip: No tenderness or bony tenderness. Normal range of motion.     Left hip: No tenderness. Normal range of motion.       Legs:  Lymphadenopathy:     Cervical: No cervical adenopathy.  Skin:    General: Skin is warm.     Coloration: Skin is not pale.     Findings: No erythema or rash.  Neurological:     Mental Status: He is alert and oriented to person, place, and time.     Cranial Nerves: No cranial nerve deficit.     Motor: No abnormal muscle tone.     Coordination: Coordination normal.     Deep Tendon Reflexes: Reflexes are normal and symmetric.  Psychiatric:        Behavior: Behavior normal.        Thought Content: Thought content normal.        Judgment: Judgment normal.           Assessment & Plan:  Acute midline low back pain with bilateral sciatica - Plan: DG Lumbar Spine Complete, CBC with Differential/Platelet, COMPLETE METABOLIC PANEL WITH GFR, CK, Sedimentation rate I suspect degenerative disc disease possibly causing spinal stenosis based on his symptoms and his history.  His neuromuscular exam today is completely normal which is reassuring.  I will check a CBC a CMP and a CK and a sed rate.  Proceed with an x-ray of the lower back.  The x-ray of the lower back confirms degenerative disc disease, we will likely try NSAIDs.  I recommended trying to hold his statin medication for 1 week just to see if any of this could be statin induced  myopathy.  Reassess next week

## 2022-07-22 LAB — CBC WITH DIFFERENTIAL/PLATELET
Absolute Monocytes: 511 cells/uL (ref 200–950)
Basophils Absolute: 101 cells/uL (ref 0–200)
Basophils Relative: 1.4 %
Eosinophils Absolute: 562 cells/uL — ABNORMAL HIGH (ref 15–500)
Eosinophils Relative: 7.8 %
HCT: 42.7 % (ref 38.5–50.0)
Hemoglobin: 14.8 g/dL (ref 13.2–17.1)
Lymphs Abs: 2614 cells/uL (ref 850–3900)
MCH: 30.1 pg (ref 27.0–33.0)
MCHC: 34.7 g/dL (ref 32.0–36.0)
MCV: 86.8 fL (ref 80.0–100.0)
MPV: 10.2 fL (ref 7.5–12.5)
Monocytes Relative: 7.1 %
Neutro Abs: 3413 cells/uL (ref 1500–7800)
Neutrophils Relative %: 47.4 %
Platelets: 235 10*3/uL (ref 140–400)
RBC: 4.92 10*6/uL (ref 4.20–5.80)
RDW: 13 % (ref 11.0–15.0)
Total Lymphocyte: 36.3 %
WBC: 7.2 10*3/uL (ref 3.8–10.8)

## 2022-07-22 LAB — COMPLETE METABOLIC PANEL WITH GFR
AG Ratio: 1.6 (calc) (ref 1.0–2.5)
ALT: 22 U/L (ref 9–46)
AST: 19 U/L (ref 10–35)
Albumin: 4.3 g/dL (ref 3.6–5.1)
Alkaline phosphatase (APISO): 65 U/L (ref 35–144)
BUN: 17 mg/dL (ref 7–25)
CO2: 30 mmol/L (ref 20–32)
Calcium: 9.3 mg/dL (ref 8.6–10.3)
Chloride: 97 mmol/L — ABNORMAL LOW (ref 98–110)
Creat: 1.18 mg/dL (ref 0.70–1.28)
Globulin: 2.7 g/dL (calc) (ref 1.9–3.7)
Glucose, Bld: 104 mg/dL — ABNORMAL HIGH (ref 65–99)
Potassium: 3.3 mmol/L — ABNORMAL LOW (ref 3.5–5.3)
Sodium: 139 mmol/L (ref 135–146)
Total Bilirubin: 0.5 mg/dL (ref 0.2–1.2)
Total Protein: 7 g/dL (ref 6.1–8.1)
eGFR: 65 mL/min/{1.73_m2} (ref >=60)

## 2022-07-22 LAB — CK: Total CK: 116 U/L (ref 44–196)

## 2022-07-22 LAB — SEDIMENTATION RATE: Sed Rate: 9 mm/h (ref 0–20)

## 2022-07-25 ENCOUNTER — Other Ambulatory Visit: Payer: Self-pay

## 2022-07-25 DIAGNOSIS — M5441 Lumbago with sciatica, right side: Secondary | ICD-10-CM

## 2022-07-25 MED ORDER — MELOXICAM 15 MG PO TABS: 15.0000 mg | ORAL_TABLET | Freq: Every day | ORAL | 0 refills | Status: DC

## 2022-07-26 ENCOUNTER — Other Ambulatory Visit: Payer: Self-pay | Admitting: Family Medicine

## 2022-07-27 ENCOUNTER — Telehealth: Payer: Self-pay | Admitting: Family Medicine

## 2022-07-27 NOTE — Telephone Encounter (Signed)
Requested medications are due for refill today.  yes  Requested medications are on the active medications list.  yes  Last refill. Varied.  Future visit scheduled.   no  Notes to clinic.  One medication is not delegated. The other the pt is more than 3 months over due for OV.    Requested Prescriptions  Pending Prescriptions Disp Refills   ALPRAZolam (XANAX) 0.5 MG tablet [Pharmacy Med Name: ALPRAZOLAM 0.5 MG TABLET] 30 tablet 0    Sig: TAKE 1 TABLET BY MOUTH 3 (THREE) TIMES DAILY AS NEEDED FOR ANXIETY OR SLEEP.     Not Delegated - Psychiatry: Anxiolytics/Hypnotics 2 Failed - 07/27/2022  8:20 AM      Failed - This refill cannot be delegated      Failed - Urine Drug Screen completed in last 360 days      Failed - Valid encounter within last 6 months    Recent Outpatient Visits           11 months ago Benign essential HTN   [facility] Pickard, Priscille Heidelberg, MD   1 year ago Wheezing   [facility] Pickard, Priscille Heidelberg, MD   1 year ago COVID   [facility] Pick[place], Priscille Heidelberg, MD   2 years ago General medical exam   [facility] Donita Brooks, MD   2 years ago Prostadynia   [facility] Pickard, Priscille Heidelberg, MD              Passed - Patient is not pregnant       hydrochlorothiazide (HYDRODIURIL) 25 MG tablet [Pharmacy Med Name: HYDROCHLOROTHIAZIDE 25 MG TAB] 90 tablet 3    Sig: Take 1 tablet (25 mg total) by mouth daily.     Cardiovascular: Diuretics - Thiazide Failed - 07/27/2022  8:20 AM      Failed - K in normal range and within 180 days    Potassium  Date Value Ref Range Status  07/21/2022 3.3 (L) 3.5 - 5.3 mmol/L Final         Failed - Valid encounter within last 6 months    Recent Outpatient Visits           11 months ago Benign essential HTN   [facility] Pickard, Priscille Heidelberg, MD   1 year ago Wheezing   [facility] Pickard, Priscille Heidelberg, MD   1 year  ago COVID   [facility] Pickard, Priscille Heidelberg, MD   2 years ago General medical exam   [facility] Donita Brooks, MD   2 years ago Prostadynia   [facility] Pickard, Priscille Heidelberg, MD              Passed - Cr in normal range and within 180 days    Creat  Date Value Ref Range Status  07/21/2022 1.18 0.70 - 1.28 mg/dL Final         Passed - Na in normal range and within 180 days    Sodium  Date Value Ref Range Status  07/21/2022 139 135 - 146 mmol/L Final         Passed - Last BP in normal range    BP Readings from Last 1 Encounters:  07/21/22 126/72

## 2022-07-27 NOTE — Telephone Encounter (Signed)
Prescription Request  07/27/2022  LOV: 07/21/2022  What is the name of the medication or equipment? omeprazole (PRILOSEC) 40 MG capsule [[id number]  Have you contacted your pharmacy to request a refill? Yes   Which pharmacy would you like this sent to?  [id number] [place], [place] - [address] [place] [address] Phone: [phone]  Fax: [phone]    Patient notified that their request is being sent to the clinical staff for review and that they should receive a response within 2 business days.   Please advise at [facility] [phone]

## 2022-07-27 NOTE — Telephone Encounter (Signed)
Contacted Keith Pratt to schedule their annual wellness visit. Appointment made for 08/03/2022.  Thank you,  Judeth Cornfield,  AMB Clinical Support [facility] Direct Dial ??[phone]

## 2022-08-03 ENCOUNTER — Ambulatory Visit (INDEPENDENT_AMBULATORY_CARE_PROVIDER_SITE_OTHER): Payer: PPO

## 2022-08-03 VITALS — Ht 74.0 in | Wt 205.0 lb

## 2022-08-03 DIAGNOSIS — Z0000 Encounter for general adult medical examination without abnormal findings: Secondary | ICD-10-CM | POA: Diagnosis not present

## 2022-08-03 NOTE — Progress Notes (Signed)
Subjective:   Keith Pratt is a 72 y.o. male who presents for Medicare Annual/Subsequent preventive examination.  I connected with  Orvilla Cornwall on 08/03/22 by a audio enabled telemedicine application and verified that I am speaking with the correct person using two identifiers.  Patient Location: Home  Provider Location: Office/Clinic  I discussed the limitations of evaluation and management by telemedicine. The patient expressed understanding and agreed to proceed.  Review of Systems     Cardiac Risk Factors include: advanced age (>38men, >93 women);dyslipidemia;hypertension     Objective:    Today's Vitals   08/03/22 0907  Weight: 205 lb (93 kg)  Height:  (1.88 m)   Body mass index is 26.32 kg/m.     08/03/2022    9:04 AM 06/29/2020    9:31 AM  Advanced Directives  Does Patient Have a Medical Advance Directive? Yes No  Type of Advance Directive Living will;Healthcare Power of Attorney   Does patient want to make changes to medical advance directive? No - Patient declined   Copy of Healthcare Power of Attorney in Chart? No - copy requested   Would patient like information on creating a medical advance directive?  No - Patient declined    Current Medications (verified) Outpatient Encounter Medications as of 08/03/2022  Medication Sig   albuterol (VENTOLIN HFA) 108 (90 Base) MCG/ACT inhaler Inhale 2 puffs into the lungs every 6 (six) hours as needed for wheezing or shortness of breath.   ALPRAZolam (XANAX) 0.5 MG tablet TAKE 1 TABLET BY MOUTH 3 (THREE) TIMES DAILY AS NEEDED FOR ANXIETY OR SLEEP.   aspirin 81 MG EC tablet aspirin 81 mg tablet,delayed release  TAKE 1 TABLET BY MOUTH TWICE A DAY   atenolol (TENORMIN) 25 MG tablet TAKE 1 TABLET BY MOUTH EVERY DAY   hydrochlorothiazide (HYDRODIURIL) 25 MG tablet TAKE 1 TABLET (25 MG TOTAL) BY MOUTH DAILY.   ibuprofen (ADVIL,MOTRIN) 200 MG tablet Take 800 mg by mouth every 6 (six) hours as needed.    losartan (COZAAR) 100 MG tablet Take 1 tablet (100 mg total) by mouth daily.   meloxicam (MOBIC) 15 MG tablet Take 1 tablet (15 mg total) by mouth daily.   omeprazole (PRILOSEC) 40 MG capsule TAKE 1 CAPSULE (40 MG TOTAL) BY MOUTH DAILY.   sildenafil (VIAGRA) 100 MG tablet TAKE 0.5-1 TABLETS (50-100 MG TOTAL) BY MOUTH DAILY AS NEEDED FOR ERECTILE DYSFUNCTION.   [DISCONTINUED] pravastatin (PRAVACHOL) 20 MG tablet Take 1 tablet (20 mg total) by mouth daily. NEEDS OV FOR FURTHER REFILLS   No facility-administered encounter medications on file as of 08/03/2022.    Allergies (verified) Cardura [doxazosin mesylate], Codeine, and Amlodipine   History: Past Medical History:  Diagnosis Date   AAA (abdominal aortic aneurysm)    3.5 cm   Anxiety    Arthritis    Cancer    basal cell CA-under left arm   Cataract    [employer] 06/29/2020   COPD (chronic obstructive pulmonary disease)    Hyperlipidemia    Hypertension    Shingles    July 2014   Thyroid disease    as a child- no problem as adult   Ulcer    Past Surgical History:  Procedure Laterality Date   CATARACT EXTRACTION     right eye   COLONOSCOPY     EYE SURGERY N/A    [facility] 06/29/2020   JOINT REPLACEMENT N/A    [facility] 06/29/2020   right arm surgery  TONSILLECTOMY     Family History  Problem Relation Age of Onset   Colon polyps Mother    Colon cancer Neg Hx    Esophageal cancer Neg Hx    Rectal cancer Neg Hx    Stomach cancer Neg Hx    Social History   Socioeconomic History   Marital status: Divorced    Spouse name: Not on file   Number of children: Not on file   Years of education: Not on file   Highest education level: Associate degree: occupational, Scientist, product/process development, or vocational program  Occupational History   Not on file  Tobacco Use   Smoking status: Former    Types: Cigarettes    Quit date: 01/08/2003    Years since quitting: 19.5   Smokeless tobacco: Never  Substance and Sexual Activity   Alcohol use:  No   Drug use: No   Sexual activity: Yes  Other Topics Concern   Not on file  Social History Narrative   Not on file   Social Determinants of Health   Financial Resource Strain: Low Risk  (08/03/2022)   Overall Financial Resource Strain (CARDIA)    Difficulty of Paying Living Expenses: Not very hard  Recent Concern: Financial Resource Strain - Medium Risk (07/20/2022)   Overall Financial Resource Strain (CARDIA)    Difficulty of Paying Living Expenses: Somewhat hard  Food Insecurity: No Food Insecurity (08/03/2022)   Hunger Vital Sign    Worried About Running Out of Food in the Last Year: Never true    Ran Out of Food in the Last Year: Never true  Transportation Needs: No Transportation Needs (08/03/2022)   PRAPARE - Administrator, Civil Service (Medical): No    Lack of Transportation (Non-Medical): No  Physical Activity: Sufficiently Active (08/03/2022)   Exercise Vital Sign    Days of Exercise per Week: 5 days    Minutes of Exercise per Session: 150+ min  Stress: Stress Concern Present (08/03/2022)   [employer] of Occupational Health - Occupational Stress Questionnaire    Feeling of Stress : To some extent  Social Connections: Moderately Integrated (08/03/2022)   Social Connection and Isolation Panel [NHANES]    Frequency of Communication with Friends and Family: Three times a week    Frequency of Social Gatherings with Friends and Family: Twice a week    Attends Religious Services: 1 to 4 times per year    Active Member of [employer] or Organizations: Yes    Attends Banker Meetings: 1 to 4 times per year    Marital Status: Divorced    Tobacco Counseling Counseling given: Not Answered   Clinical Intake:  Pre-visit preparation completed: Yes  Pain : No/denies pain  Diabetes: No  How often do you need to have someone help you when you read instructions, pamphlets, or other written materials from your doctor or pharmacy?: 1 -  Never  Diabetic?No   Interpreter Needed?: No  Information entered by :: Kandis Fantasia LPN   Activities of Daily Living    08/03/2022    9:04 AM 08/02/2022    9:46 PM  In your present state of health, do you have any difficulty performing the following activities:  Hearing? 1 1  Vision? 0 0  Difficulty concentrating or making decisions? 0 0  Walking or climbing stairs? 1 1  Dressing or bathing? 0 0  Doing errands, shopping? 0 0  Preparing Food and eating ? N N  Using the Toilet? N N  In the past six months, have you accidently leaked urine? N N  Do you have problems with loss of bowel control? N N  Managing your Medications? N N  Managing your Finances? N N  Housekeeping or managing your Housekeeping? N N    Patient Care Team: Donita Brooks, MD as PCP - General (Family Medicine) Erroll Luna, [facility] as Pharmacist (Pharmacist)  Indicate any recent Medical Services you may have received from other than [facility] providers in the past year (date may be approximate).     Assessment:   This is a routine wellness examination for [place].  Hearing/Vision screen Hearing Screening - Comments:: Denies hearing difficulties   Vision Screening - Comments:: No vision problems; will schedule routine eye exam soon    Dietary issues and exercise activities discussed: Current Exercise Habits: Home exercise routine, Type of exercise: walking;stretching;strength training/weights, Time (Minutes): 30, Frequency (Times/Week): 5, Weekly Exercise (Minutes/Week): 150, Intensity: Mild   Goals Addressed             This Visit's Progress    COMPLETED: Pharmacy Care Plan:       CARE PLAN ENTRY (see longitudinal plan of care for additional care plan information)  Current Barriers:  Chronic Disease Management support, education, and care coordination needs related to Hypertension and Hyperlipidemia   Hypertension BP Readings from Last 3 Encounters:  10/23/19 (!) 144/86  02/18/19  130/74  05/28/18 (!) 150/100   Pharmacist Clinical Goal(s): Over the next 180 days, patient will work with PharmD and providers to achieve BP goal <140/90 Current regimen:  Atenolol 25mg  HCTZ 25mg  Losartan 100mg  Interventions: Reviewed office BP Discussed medication adherence Discussed diet low in sodium Patient self care activities - Over the next 180 days, patient will: Check BP periodically, document, and provide at future appointments Ensure daily salt intake < 2300 mg/day  Hyperlipidemia Lab Results  Component Value Date/Time   LDLCALC 86 05/28/2018 10:06 AM   Pharmacist Clinical Goal(s): Over the next 180 days, patient will work with PharmD and providers to maintain LDL goal < 100 Current regimen:  Pravastatin 20mg  Interventions: Reviewed most recent lipid panel Patient self care activities - Over the next 180 days, patient will: Continue to focus on medication adherence by using pill box Work on diet low in saturated fats and sodium.   Initial goal documentation      Remain active and independent       COMPLETED: Track and Manage My Blood Pressure-Hypertension       Timeframe:  Long-Range Goal Priority:  High Start Date:       06/24/20                      Expected End Date:     12/25/20                  Follow Up Date 06/31/22   - check blood pressure 3 times per week - write blood pressure results in a log or diary    Why is this important?   You won't feel high blood pressure, but it can still hurt your blood vessels.  High blood pressure can cause heart or kidney problems. It can also cause a stroke.  Making lifestyle changes like losing a little weight or eating less salt will help.  Checking your blood pressure at home and at different times of the day can help to control blood pressure.  If the doctor prescribes medicine remember to take it  the way the doctor ordered.  Call the office if you cannot afford the medicine or if there are questions about  it.     Notes:       Depression Screen    08/03/2022    9:08 AM 07/21/2022   10:38 AM 08/26/2021    2:46 PM 08/26/2021    2:45 PM 06/29/2020    9:29 AM 05/28/2018    9:24 AM 06/29/2016    9:45 AM  PHQ 2/9 Scores  PHQ - 2 Score 0 0 0 0 2 0 0  PHQ- 9 Score   0  4  2    Fall Risk    08/03/2022    9:08 AM 08/02/2022    9:46 PM 07/21/2022   10:38 AM 08/26/2021    2:45 PM 06/29/2020    9:29 AM  Fall Risk   Falls in the past year? 0 0 0 0 0  Number falls in past yr: 0 0 0 0   Injury with Fall? 0 0 0 0   Risk for fall due to : No Fall Risks  No Fall Risks No Fall Risks No Fall Risks  Follow up Falls prevention discussed;Education provided;Falls evaluation completed  Falls prevention discussed Falls evaluation completed Falls evaluation completed    FALL RISK PREVENTION PERTAINING TO THE HOME:  Any stairs in or around the home? No  If so, are there any without handrails? No  Home free of loose throw rugs in walkways, pet beds, electrical cords, etc? Yes  Adequate lighting in your home to reduce risk of falls? Yes   ASSISTIVE DEVICES UTILIZED TO PREVENT FALLS:  Life alert? No  Use of a cane, walker or w/c? No  Grab bars in the bathroom? Yes  Shower chair or bench in shower? No  Elevated toilet seat or a handicapped toilet? Yes   TIMED UP AND GO:  Was the test performed? No . Telephonic visit   Cognitive Function:        08/03/2022    9:07 AM  6CIT Screen  What Year? 0 points  What month? 0 points  What time? 0 points  Count back from 20 0 points  Months in reverse 0 points  Repeat phrase 0 points  Total Score 0 points    Immunizations Immunization History  Administered Date(s) Administered   Influenza, High Dose Seasonal PF 03/01/2018, 01/07/2019   PFIZER(Purple Top)SARS-COV-2 Vaccination 05/29/2019, 06/19/2019   Pneumococcal Conjugate-13 05/06/2015   Pneumococcal Polysaccharide-23 06/06/2012, 01/07/2019   Tdap 06/06/2012   Zoster, Live 06/27/2014    TDAP  status: Due, Education has been provided regarding the importance of this vaccine. Advised may receive this vaccine at local pharmacy or Health Dept. Aware to provide a copy of the vaccination record if obtained from local pharmacy or Health Dept. Verbalized acceptance and understanding.  Flu Vaccine status: Up to date  Pneumococcal vaccine status: Up to date  Covid-19 vaccine status: Information provided on how to obtain vaccines.   Qualifies for Shingles Vaccine? Yes   Zostavax completed Yes   Shingrix Completed?: No.    Education has been provided regarding the importance of this vaccine. Patient has been advised to call insurance company to determine out of pocket expense if they have not yet received this vaccine. Advised may also receive vaccine at local pharmacy or Health Dept. Verbalized acceptance and understanding.  Screening Tests Health Maintenance  Topic Date Due   COVID-19 Vaccine (3 - 2023-24 season) 08/11/2022 (Originally 12/16/2021)  Zoster Vaccines- Shingrix (1 of 2) 10/20/2022 (Originally 05/06/1998)   Hepatitis C Screening  07/21/2023 (Originally 05/06/1968)   INFLUENZA VACCINE  11/16/2022   COLONOSCOPY (Pts 45-49yrs Insurance coverage will need to be confirmed)  01/22/2023   Medicare Annual Wellness (AWV)  08/03/2023   Pneumonia Vaccine 74+ Years old  Completed   HPV VACCINES  Aged Out   DTaP/Tdap/Td  Discontinued    Health Maintenance  There are no preventive care reminders to display for this patient.   Colorectal cancer screening: Type of screening: Colonoscopy. Completed 01/21/13. Repeat every 10 years  Lung Cancer Screening: (Low Dose CT Chest recommended if Age 21-80 years, 30 pack-year currently smoking OR have quit w/in 15years.) does not qualify.   Lung Cancer Screening Referral: n/a  Additional Screening:  Hepatitis C Screening: does qualify;  Vision Screening: Recommended annual ophthalmology exams for early detection of glaucoma and other  disorders of the eye. Is the patient up to date with their annual eye exam?  Yes  Who is the provider or what is the name of the office in which the patient attends annual eye exams? Dr. Harlon Flor  If pt is not established with a provider, would they like to be referred to a provider to establish care? No .   Dental Screening: Recommended annual dental exams for proper oral hygiene  Community Resource Referral / Chronic Care Management: CRR required this visit?  No   CCM required this visit?  No      Plan:     I have personally reviewed and noted the following in the patient's chart:   Medical and social history Use of alcohol, tobacco or illicit drugs  Current medications and supplements including opioid prescriptions. Patient is not currently taking opioid prescriptions. Functional ability and status Nutritional status Physical activity Advanced directives List of other physicians Hospitalizations, surgeries, and ER visits in previous 12 months Vitals Screenings to include cognitive, depression, and falls Referrals and appointments  In addition, I have reviewed and discussed with patient certain preventive protocols, quality metrics, and best practice recommendations. A written personalized care plan for preventive services as well as general preventive health recommendations were provided to patient.     Durwin Nora, [place]   9/60/4540   Due to this being a virtual visit, the after visit summary with patients personalized plan was offered to patient via mail or my-chart. Patient would like to access on my-chart  Nurse Notes: No concerns

## 2022-08-03 NOTE — Patient Instructions (Signed)
Mr. Keith Pratt , Thank you for taking time to come for your Medicare Wellness Visit. I appreciate your ongoing commitment to your health goals. Please review the following plan we discussed and let me know if I can assist you in the future.   These are the goals we discussed:  Goals      Remain active and independent        This is a list of the screening recommended for you and due dates:  Health Maintenance  Topic Date Due   COVID-19 Vaccine (3 - 2023-24 season) 08/11/2022*   Zoster (Shingles) Vaccine (1 of 2) 10/20/2022*   Hepatitis C Screening: USPSTF Recommendation to screen - Ages 74-79 yo.  07/21/2023*   Flu Shot  11/16/2022   Colon Cancer Screening  01/22/2023   Medicare Annual Wellness Visit  08/03/2023   Pneumonia Vaccine  Completed   HPV Vaccine  Aged Out   DTaP/Tdap/Td vaccine  Discontinued  *Topic was postponed. The date shown is not the original due date.    Advanced directives: Please bring a copy of your health care power of attorney and living will to the office to be added to your chart at your convenience.   Conditions/risks identified: Aim for 30 minutes of exercise or brisk walking, 6-8 glasses of water, and 5 servings of fruits and vegetables each day.  Next appointment: Follow up in one year for your annual wellness visit.   Preventive Care 74 Years and Older, Male  Preventive care refers to lifestyle choices and visits with your health care provider that can promote health and wellness. What does preventive care include? A yearly physical exam. This is also called an annual well check. Dental exams once or twice a year. Routine eye exams. Ask your health care provider how often you should have your eyes checked. Personal lifestyle choices, including: Daily care of your teeth and gums. Regular physical activity. Eating a healthy diet. Avoiding tobacco and drug use. Limiting alcohol use. Practicing safe sex. Taking low doses of aspirin every  day. Taking vitamin and mineral supplements as recommended by your health care provider. What happens during an annual well check? The services and screenings done by your health care provider during your annual well check will depend on your age, overall health, lifestyle risk factors, and family history of disease. Counseling  Your health care provider may ask you questions about your: Alcohol use. Tobacco use. Drug use. Emotional well-being. Home and relationship well-being. Sexual activity. Eating habits. History of falls. Memory and ability to understand (cognition). Work and work Astronomer. Screening  You may have the following tests or measurements: Height, weight, and BMI. Blood pressure. Lipid and cholesterol levels. These may be checked every 5 years, or more frequently if you are over 74 years old. Skin check. Lung cancer screening. You may have this screening every year starting at age 74 if you have a 30-pack-year history of smoking and currently smoke or have quit within the past 15 years. Fecal occult blood test (FOBT) of the stool. You may have this test every year starting at age 74. Flexible sigmoidoscopy or colonoscopy. You may have a sigmoidoscopy every 5 years or a colonoscopy every 10 years starting at age 74. Prostate cancer screening. Recommendations will vary depending on your family history and other risks. Hepatitis C blood test. Hepatitis B blood test. Sexually transmitted disease (STD) testing. Diabetes screening. This is done by checking your blood sugar (glucose) after you have not eaten for a while (  fasting). You may have this done every 1-3 years. Abdominal aortic aneurysm (AAA) screening. You may need this if you are a current or former smoker. Osteoporosis. You may be screened starting at age 74 if you are at high risk. Talk with your health care provider about your test results, treatment options, and if necessary, the need for more  tests. Vaccines  Your health care provider may recommend certain vaccines, such as: Influenza vaccine. This is recommended every year. Tetanus, diphtheria, and acellular pertussis (Tdap, Td) vaccine. You may need a Td booster every 10 years. Zoster vaccine. You may need this after age 74. Pneumococcal 13-valent conjugate (PCV13) vaccine. One dose is recommended after age 74. Pneumococcal polysaccharide (PPSV23) vaccine. One dose is recommended after age 74. Talk to your health care provider about which screenings and vaccines you need and how often you need them. This information is not intended to replace advice given to you by your health care provider. Make sure you discuss any questions you have with your health care provider. Document Released: 04/30/2015 Document Revised: 12/22/2015 Document Reviewed: 02/02/2015 Elsevier Interactive Patient Education  2017 [employer] Prevention in the Home Falls can cause injuries. They can happen to people of all ages. There are many things you can do to make your home safe and to help prevent falls. What can I do on the outside of my home? Regularly fix the edges of walkways and driveways and fix any cracks. Remove anything that might make you trip as you walk through a door, such as a raised step or threshold. Trim any bushes or trees on the path to your home. Use bright outdoor lighting. Clear any walking paths of anything that might make someone trip, such as rocks or tools. Regularly check to see if handrails are loose or broken. Make sure that both sides of any steps have handrails. Any raised decks and porches should have guardrails on the edges. Have any leaves, snow, or ice cleared regularly. Use sand or salt on walking paths during winter. Clean up any spills in your garage right away. This includes oil or grease spills. What can I do in the bathroom? Use night lights. Install grab bars by the toilet and in the tub and shower.  Do not use towel bars as grab bars. Use non-skid mats or decals in the tub or shower. If you need to sit down in the shower, use a plastic, non-slip stool. Keep the floor dry. Clean up any water that spills on the floor as soon as it happens. Remove soap buildup in the tub or shower regularly. Attach bath mats securely with double-sided non-slip rug tape. Do not have throw rugs and other things on the floor that can make you trip. What can I do in the bedroom? Use night lights. Make sure that you have a light by your bed that is easy to reach. Do not use any sheets or blankets that are too big for your bed. They should not hang down onto the floor. Have a firm chair that has side arms. You can use this for support while you get dressed. Do not have throw rugs and other things on the floor that can make you trip. What can I do in the kitchen? Clean up any spills right away. Avoid walking on wet floors. Keep items that you use a lot in easy-to-reach places. If you need to reach something above you, use a strong step stool that has a grab bar.  Keep electrical cords out of the way. Do not use floor polish or wax that makes floors slippery. If you must use wax, use non-skid floor wax. Do not have throw rugs and other things on the floor that can make you trip. What can I do with my stairs? Do not leave any items on the stairs. Make sure that there are handrails on both sides of the stairs and use them. Fix handrails that are broken or loose. Make sure that handrails are as long as the stairways. Check any carpeting to make sure that it is firmly attached to the stairs. Fix any carpet that is loose or worn. Avoid having throw rugs at the top or bottom of the stairs. If you do have throw rugs, attach them to the floor with carpet tape. Make sure that you have a light switch at the top of the stairs and the bottom of the stairs. If you do not have them, ask someone to add them for you. What else  can I do to help prevent falls? Wear shoes that: Do not have high heels. Have rubber bottoms. Are comfortable and fit you well. Are closed at the toe. Do not wear sandals. If you use a stepladder: Make sure that it is fully opened. Do not climb a closed stepladder. Make sure that both sides of the stepladder are locked into place. Ask someone to hold it for you, if possible. Clearly mark and make sure that you can see: Any grab bars or handrails. First and last steps. Where the edge of each step is. Use tools that help you move around (mobility aids) if they are needed. These include: Canes. Walkers. Scooters. Crutches. Turn on the lights when you go into a dark area. Replace any light bulbs as soon as they burn out. Set up your furniture so you have a clear path. Avoid moving your furniture around. If any of your floors are uneven, fix them. If there are any pets around you, be aware of where they are. Review your medicines with your doctor. Some medicines can make you feel dizzy. This can increase your chance of falling. Ask your doctor what other things that you can do to help prevent falls. This information is not intended to replace advice given to you by your health care provider. Make sure you discuss any questions you have with your health care provider. Document Released: 01/28/2009 Document Revised: 09/09/2015 Document Reviewed: 05/08/2014 Elsevier Interactive Patient Education  2017 [employer].

## 2022-08-07 NOTE — Therapy (Unsigned)
OUTPATIENT PHYSICAL THERAPY THORACOLUMBAR EVALUATION   Patient Name: Keith Pratt MRN: [id number] DOB:[birth year], 72 y.o., male Today's Date: 08/09/2022  END OF SESSION:  PT End of Session - 08/09/22 1633     Visit Number 2    Number of Visits 12    Date for PT Re-Evaluation 09/20/22    Authorization Type [employer]    PT Start Time 1558    PT Stop Time 1634    PT Time Calculation (min) 36 min    Activity Tolerance Patient tolerated treatment well    Behavior During Therapy [facility] for tasks assessed/performed             Past Medical History:  Diagnosis Date   AAA (abdominal aortic aneurysm)    3.5 cm   Anxiety    Arthritis    Cancer    basal cell CA-under left arm   Cataract    Phreesia 06/29/2020   COPD (chronic obstructive pulmonary disease)    Hyperlipidemia    Hypertension    Shingles    July 2014   Thyroid disease    as a child- no problem as adult   Ulcer    Past Surgical History:  Procedure Laterality Date   CATARACT EXTRACTION     right eye   COLONOSCOPY     EYE SURGERY N/A    Phreesia 06/29/2020   JOINT REPLACEMENT N/A    Phreesia 06/29/2020   right arm surgery     TONSILLECTOMY     Patient Active Problem List   Diagnosis Date Noted   Right knee pain 10/08/2013   Hyperlipidemia    Hypertension     PCP: Lynnea Ferrier  REFERRING PROVIDER: Lynnea Ferrier  REFERRING DIAG:  [phone] (ICD-10-CM) - Acute midline low back pain with bilateral sciatica    Rationale for Evaluation and Treatment: Rehabilitation  THERAPY DIAG:  Lumbar radiculopathy Mm weakness  ONSET DATE: on and off for a year but in the past two months his low back and hips have a constant ache.                                                                                                                                                                                              SUBJECTIVE STATEMENT: Patient reports aching pain in the center of his  back that radiates into his pelvis. It also radiates into both gluteus muscles. It is an aching constant pain made worse with forward flexion. He also reports numbness and tingling occasionally shooting down his legs. Able to sit for 5 minutes and then he wants to shift his wt. ,  stand less than 5 minutes,  walk for less than 5 minutes  PERTINENT HISTORY:  OA  PAIN:  Are you having pain? Yes: NPRS scale: 7/10 Pain location: low back and B hips  Pain description: aching  Aggravating factors: sitting and bending  Relieving factors: go into the recliner   PRECAUTIONS: None  WEIGHT BEARING RESTRICTIONS: No  FALLS:  Has patient fallen in last 6 months? No  LIVING ENVIRONMENT: Lives with: lives with their family Lives in: House/apartment Stairs: No   OCCUPATION: retired  PLOF: Independent  PATIENT GOALS: less pain   NEXT MD VISIT: unknown  OBJECTIVE:   DIAGNOSTIC FINDINGS:  IMPRESSION: 1. No acute displaced fracture or traumatic listhesis of the lumbar spine. 2. Interval worsening of grade 1 anterolisthesis of L5 on S1 with chronic L5 bilateral pars interarticularis defects. 3.  Aortic Atherosclerosis (ICD10-I70.0).    PATIENT SURVEYS:  FOTO 32    COGNITION: Overall cognitive status: Within functional limits for tasks assessed     SENSATION: WFL  MUSCLE LENGTH: Hamstrings: Right 140 deg; Left 155 deg POSTURE: decreased lumbar lordosis and decreased thoracic kyphosis  LUMBAR ROM:   AROM eval  Flexion Fingers to toes reps no change   Extension 18 reps increase pain   Right lateral flexion   Left lateral flexion   Right rotation   Left rotation    (Blank rows = not tested)  LOWER EXTREMITY MMT:    MMT Right eval Left eval  Hip flexion 4+ 5  Hip extension 3 4-  Hip abduction 5 4+  Hip adduction    Hip internal rotation    Hip external rotation    Knee flexion 4 5  Knee extension 5 5  Ankle dorsiflexion 5 5  Ankle plantarflexion    Ankle  inversion    Ankle eversion     (Blank rows = not tested)   FUNCTIONAL TESTS:  30 seconds chair stand test:  8 poor for males his age 73 minute walk test: 542 ft  Single leg stance:  Rt: 1"  LT: 3"   TODAY'S TREATMENT:                                                                                                                              DATE: evaluation Supine: Active hamstring stretch 30" x 3  Knee to chest 30" x 3 Bridge x 10      PATIENT EDUCATION:  Education details: HEP Person educated: Patient Education method: Programmer, multimedia, Verbal cues, and Handouts Education comprehension: returned demonstration  HOME EXERCISE PROGRAM: Access Code: [email or web address] URL: [email or web address] Date: 08/09/2022 Prepared by: Virgina Organ  Exercises - Hooklying Active Hamstring Stretch  - 2 x daily - 7 x weekly - 1 sets - 3 reps - 30" hold - Supine Single Knee to Chest Stretch  - 2 x daily - 7 x weekly - 1 sets - 3 reps - 30" hold - Supine Bridge  - 2 x  daily - 7 x weekly - 1 sets - 10 reps - 5" hold  ASSESSMENT:  CLINICAL IMPRESSION: Patient is a 72 y.o. male who was seen today for physical therapy evaluation and treatment for evaluation and treatment of low back pain with radicular sx in both LE.  Evaluation demonstrates decreased ROM, decreased activity tolerance, decreased core and LE strength and increased pain.  Mr. Keith Pratt will benefit from skilled PT to address these issues and maximize his functional mobility.   OBJECTIVE IMPAIRMENTS: decreased activity tolerance, decreased balance, difficulty walking, decreased strength, postural dysfunction, and pain.   ACTIVITY LIMITATIONS: carrying, lifting, bending, sitting, standing, and locomotion level  PARTICIPATION LIMITATIONS: cleaning, community activity, and yard work  [facility] POTENTIAL: Good  CLINICAL DECISION MAKING: Stable/uncomplicated  EVALUATION COMPLEXITY: Low   GOALS: Goals reviewed with patient?  No  SHORT TERM GOALS: Target date: 08/30/23  PT to be I in HEP in order to decrease his pain to no greater than a 5/10 Baseline: Goal status: INITIAL  2.  Pt to be able to sit for 15 minutes without increased pain  Baseline:  Goal status: INITIAL  3.  Pt to be able to walk for 15 minutes without increased pain  Baseline:  Goal status: INITIAL   LONG TERM GOALS: Target date: 09/20/22  Pt to be I in an advanced HEP in order to decrease his pain to no greater than a 3/10 Baseline:  Goal status: INITIAL  2.  PT to be able to walk for 30 minutes without increased pain  Baseline:  Goal status: INITIAL  3.  PT to be able to sit in comfort for 30 minutes.  Baseline:  Goal status: INITIAL 4.  Pt to be able to demonstrate good body mechanics     PLAN:  PT FREQUENCY: 2x/week  PT DURATION: 6 weeks  PLANNED INTERVENTIONS: Therapeutic exercises, Therapeutic activity, Neuromuscular re-education, Balance training, Gait training, Patient/Family education, Self Care, Joint mobilization, and Manual therapy.  PLAN FOR NEXT SESSION: continue with stretching and strengthening of core and LE> flexion based exercises   Donnamae Jude PT/CLT [phone]

## 2022-08-09 ENCOUNTER — Ambulatory Visit (HOSPITAL_COMMUNITY): Payer: PPO | Attending: Family Medicine | Admitting: Physical Therapy

## 2022-08-09 ENCOUNTER — Other Ambulatory Visit: Payer: Self-pay

## 2022-08-09 DIAGNOSIS — M5441 Lumbago with sciatica, right side: Secondary | ICD-10-CM | POA: Diagnosis not present

## 2022-08-09 DIAGNOSIS — M5442 Lumbago with sciatica, left side: Secondary | ICD-10-CM | POA: Diagnosis not present

## 2022-08-09 DIAGNOSIS — M5416 Radiculopathy, lumbar region: Secondary | ICD-10-CM | POA: Insufficient documentation

## 2022-08-09 DIAGNOSIS — M6281 Muscle weakness (generalized): Secondary | ICD-10-CM | POA: Diagnosis not present

## 2022-08-09 NOTE — Addendum Note (Signed)
Addended by: Bella Kennedy on: 08/09/2022 04:45 PM   Modules accepted: Orders

## 2022-08-16 ENCOUNTER — Encounter (HOSPITAL_COMMUNITY): Payer: PPO

## 2022-08-22 ENCOUNTER — Encounter (HOSPITAL_COMMUNITY): Payer: PPO

## 2022-08-23 ENCOUNTER — Other Ambulatory Visit: Payer: Self-pay | Admitting: Family Medicine

## 2022-08-23 DIAGNOSIS — H20012 Primary iridocyclitis, left eye: Secondary | ICD-10-CM | POA: Diagnosis not present

## 2022-08-23 DIAGNOSIS — M5441 Lumbago with sciatica, right side: Secondary | ICD-10-CM

## 2022-08-23 DIAGNOSIS — H43813 Vitreous degeneration, bilateral: Secondary | ICD-10-CM | POA: Diagnosis not present

## 2022-08-23 NOTE — Telephone Encounter (Signed)
Requested Prescriptions  Pending Prescriptions Disp Refills   meloxicam (MOBIC) 15 MG tablet [Pharmacy Med Name: MELOXICAM 15 MG TABLET] 90 tablet 0    Sig: TAKE 1 TABLET (15 MG TOTAL) BY MOUTH DAILY.     Analgesics:  COX2 Inhibitors Failed - 08/23/2022  2:31 AM      Failed - Manual Review: Labs are only required if the patient has taken medication for more than 8 weeks.      Failed - Valid encounter within last 12 months    Recent Outpatient Visits           12 months ago Benign essential HTN   [facility] Pickard, Priscille Heidelberg, MD   1 year ago Wheezing   [facility] Medicine Pickard, Priscille Heidelberg, MD   1 year ago COVID   [facility] Pickard, Priscille Heidelberg, MD   2 years ago General medical exam   [facility] Family Medicine Donita Brooks, MD   2 years ago Prostadynia   [facility] Medicine Pickard, Priscille Heidelberg, MD              Passed - HGB in normal range and within 360 days    Hemoglobin  Date Value Ref Range Status  07/21/2022 14.8 13.2 - 17.1 g/dL Final         Passed - Cr in normal range and within 360 days    Creat  Date Value Ref Range Status  07/21/2022 1.18 0.70 - 1.28 mg/dL Final         Passed - HCT in normal range and within 360 days    HCT  Date Value Ref Range Status  07/21/2022 42.7 38.5 - 50.0 % Final         Passed - AST in normal range and within 360 days    AST  Date Value Ref Range Status  07/21/2022 19 10 - 35 U/L Final         Passed - ALT in normal range and within 360 days    ALT  Date Value Ref Range Status  07/21/2022 22 9 - 46 U/L Final         Passed - eGFR is 30 or above and within 360 days    GFR, Est African American  Date Value Ref Range Status  06/29/2020 72 > OR = 60 mL/min/1.34m2 Final   GFR, Est Non African American  Date Value Ref Range Status  06/29/2020 62 > OR = 60 mL/min/1.3m2 Final   eGFR  Date Value Ref Range Status  07/21/2022 65 > OR = 60 mL/min/1.52m2 Final          Passed - Patient is not pregnant

## 2022-08-24 ENCOUNTER — Other Ambulatory Visit: Payer: Self-pay | Admitting: Family Medicine

## 2022-08-24 ENCOUNTER — Encounter (HOSPITAL_COMMUNITY): Payer: PPO

## 2022-08-25 NOTE — Telephone Encounter (Signed)
Requested medication (s) are due for refill today: yes  Requested medication (s) are on the active medication list: yes  Last refill:  07/27/22  Future visit scheduled: no  Notes to clinic:  med not delegated to NT to RF   Requested Prescriptions  Pending Prescriptions Disp Refills   ALPRAZolam (XANAX) 0.5 MG tablet [Pharmacy Med Name: ALPRAZOLAM 0.5 MG TABLET] 30 tablet 0    Sig: TAKE 1 TABLET BY MOUTH 3 (THREE) TIMES DAILY AS NEEDED FOR ANXIETY OR SLEEP.     Not Delegated - Psychiatry: Anxiolytics/Hypnotics 2 Failed - 08/24/2022 10:22 PM      Failed - This refill cannot be delegated      Failed - Urine Drug Screen completed in last 360 days      Failed - Valid encounter within last 6 months    Recent Outpatient Visits           12 months ago Benign essential HTN   [facility] Family Medicine Pickard, Priscille Heidelberg, MD   1 year ago Wheezing   [facility] Medicine Pickard, Priscille Heidelberg, MD   1 year ago COVID   [facility] Medicine Pickard, Priscille Heidelberg, MD   2 years ago General medical exam   [facility] Family Medicine Donita Brooks, MD   2 years ago Prostadynia   [facility] Medicine Pickard, Priscille Heidelberg, MD              Passed - Patient is not pregnant

## 2022-08-29 ENCOUNTER — Encounter (HOSPITAL_COMMUNITY): Payer: PPO | Admitting: Physical Therapy

## 2022-08-31 ENCOUNTER — Encounter (HOSPITAL_COMMUNITY): Payer: PPO | Admitting: Physical Therapy

## 2022-09-07 ENCOUNTER — Encounter (HOSPITAL_COMMUNITY): Payer: PPO | Admitting: Physical Therapy

## 2022-09-12 ENCOUNTER — Encounter (HOSPITAL_COMMUNITY): Payer: PPO | Admitting: Physical Therapy

## 2022-09-14 ENCOUNTER — Encounter (HOSPITAL_COMMUNITY): Payer: PPO | Admitting: Physical Therapy

## 2022-09-14 DIAGNOSIS — H20012 Primary iridocyclitis, left eye: Secondary | ICD-10-CM | POA: Diagnosis not present

## 2022-09-14 DIAGNOSIS — H43813 Vitreous degeneration, bilateral: Secondary | ICD-10-CM | POA: Diagnosis not present

## 2022-09-18 ENCOUNTER — Other Ambulatory Visit: Payer: Self-pay

## 2022-09-18 NOTE — Telephone Encounter (Signed)
Prescription Request  09/18/2022  LOV: 07/21/22  What is the name of the medication or equipment? omeprazole (PRILOSEC) 40 MG capsule [[id number]]  Have you contacted your pharmacy to request a refill? Yes   Which pharmacy would you like this sent to?  [id number] [place], [place] - [address] [place] [address] Phone: [phone] Fax: [phone]    Patient notified that their request is being sent to the clinical staff for review and that they should receive a response within 2 business days.   Please advise at [facility] [phone]

## 2022-09-19 ENCOUNTER — Encounter (HOSPITAL_COMMUNITY): Payer: PPO | Admitting: Physical Therapy

## 2022-09-19 MED ORDER — OMEPRAZOLE 40 MG PO CPDR: 40.0000 mg | DELAYED_RELEASE_CAPSULE | Freq: Every day | ORAL | 3 refills | Status: DC

## 2022-09-19 NOTE — Telephone Encounter (Signed)
Requested Prescriptions  Pending Prescriptions Disp Refills   omeprazole (PRILOSEC) 40 MG capsule 90 capsule 3    Sig: Take 1 capsule (40 mg total) by mouth daily.     Gastroenterology: Proton Pump Inhibitors Failed - 09/18/2022  4:22 PM      Failed - Valid encounter within last 12 months    Recent Outpatient Visits           1 year ago Benign essential HTN   [facility] Family Medicine Tanya Nones, Priscille Heidelberg, MD   1 year ago Wheezing   [facility] Family Medicine Tanya Nones, Priscille Heidelberg, MD   1 year ago COVID   [facility] Medicine Pickard, Priscille Heidelberg, MD   2 years ago General medical exam   [facility] Family Medicine Donita Brooks, MD   2 years ago Prostadynia   [facility] Medicine Pickard, Priscille Heidelberg, MD

## 2022-09-27 ENCOUNTER — Other Ambulatory Visit: Payer: Self-pay | Admitting: Family Medicine

## 2022-09-28 NOTE — Telephone Encounter (Signed)
Requested medication (s) are due for refill today: yes  Requested medication (s) are on the active medication list: yes  Last refill:  08/25/22  Future visit scheduled: no  Notes to clinic:  Unable to refill per protocol, cannot delegate.      Requested Prescriptions  Pending Prescriptions Disp Refills   ALPRAZolam (XANAX) 0.5 MG tablet [Pharmacy Med Name: ALPRAZOLAM 0.5 MG TABLET] 30 tablet 0    Sig: TAKE 1 TABLET BY MOUTH UP TO 3 TIMES DAILY AS NEEDED FOR ANXIETY/SLEEP     Not Delegated - Psychiatry: Anxiolytics/Hypnotics 2 Failed - 09/27/2022 11:17 PM      Failed - This refill cannot be delegated      Failed - Urine Drug Screen completed in last 360 days      Failed - Valid encounter within last 6 months    Recent Outpatient Visits           1 year ago Benign essential HTN   [facility] Medicine Donita Brooks, MD   1 year ago Wheezing   [facility] Family Medicine Pickard, Priscille Heidelberg, MD   1 year ago COVID   [facility] Medicine Pickard, Priscille Heidelberg, MD   2 years ago General medical exam   [facility] Family Medicine Donita Brooks, MD   2 years ago Prostadynia   [facility] Medicine Pickard, Priscille Heidelberg, MD              Passed - Patient is not pregnant

## 2022-10-23 ENCOUNTER — Other Ambulatory Visit: Payer: Self-pay | Admitting: Family Medicine

## 2022-10-23 DIAGNOSIS — M5441 Lumbago with sciatica, right side: Secondary | ICD-10-CM

## 2022-10-23 MED ORDER — MELOXICAM 15 MG PO TABS
15.0000 mg | ORAL_TABLET | Freq: Every day | ORAL | 0 refills | Status: DC
Start: 2022-10-23 — End: 2023-01-01

## 2022-10-29 ENCOUNTER — Other Ambulatory Visit: Payer: Self-pay | Admitting: Family Medicine

## 2022-10-30 NOTE — Telephone Encounter (Signed)
Requested medication (s) are due for refill today: yes  Requested medication (s) are on the active medication list: yes  Last refill:  10/02/22  Future visit scheduled: no  Notes to clinic:  Unable to refill per protocol, cannot delegate.      Requested Prescriptions  Pending Prescriptions Disp Refills   ALPRAZolam (XANAX) 0.5 MG tablet [Pharmacy Med Name: ALPRAZOLAM 0.5 MG TABLET] 30 tablet 0    Sig: TAKE 1 TABLET BY MOUTH UP TO 3 TIMES DAILY AS NEEDED FOR ANXIETY/SLEEP     Not Delegated - Psychiatry: Anxiolytics/Hypnotics 2 Failed - 10/29/2022  8:51 AM      Failed - This refill cannot be delegated      Failed - Urine Drug Screen completed in last 360 days      Failed - Valid encounter within last 6 months    Recent Outpatient Visits           1 year ago Benign essential HTN   [facility] Family Medicine Donita Brooks, MD   1 year ago Wheezing   [facility] Family Medicine Pickard, Priscille Heidelberg, MD   1 year ago COVID   [facility] Medicine Pickard, Priscille Heidelberg, MD   2 years ago General medical exam   [facility] Family Medicine Donita Brooks, MD   3 years ago Prostadynia   [facility] Medicine Pickard, Priscille Heidelberg, MD              Passed - Patient is not pregnant

## 2022-11-02 ENCOUNTER — Other Ambulatory Visit: Payer: Self-pay | Admitting: Family Medicine

## 2022-11-02 ENCOUNTER — Telehealth: Payer: Self-pay

## 2022-11-02 MED ORDER — ALPRAZOLAM 0.5 MG PO TABS: ORAL_TABLET | ORAL | 2 refills | Status: DC

## 2022-11-02 NOTE — Telephone Encounter (Signed)
Pt's Xanax refill was declined by the [facility]. Pt asks if we can refill the medication for him

## 2022-11-02 NOTE — Telephone Encounter (Signed)
Please advise pharmacist. Please see previous messages in thread.

## 2022-11-24 ENCOUNTER — Encounter (HOSPITAL_COMMUNITY): Payer: Self-pay | Admitting: Physical Therapy

## 2022-11-24 NOTE — Therapy (Signed)
PHYSICAL THERAPY DISCHARGE SUMMARY  Visits from Start of Care: 1  Current functional level related to goals / functional outcomes: Unknown as pt did not return.     Remaining deficits: Unknown as pt did not return.     Education / Equipment: Unknown as pt did not return.     Patient agrees to discharge. Patient goals were not met. Patient is being discharged due to not returning since the last visit. Virgina Organ, PT CLT [phone]

## 2023-01-01 ENCOUNTER — Other Ambulatory Visit: Payer: Self-pay

## 2023-01-01 DIAGNOSIS — M5441 Lumbago with sciatica, right side: Secondary | ICD-10-CM

## 2023-01-01 NOTE — Telephone Encounter (Signed)
Prescription Request  01/01/2023  LOV: 07/21/22 upcoming cpe 01/26/23  What is the name of the medication or equipment? meloxicam (MOBIC) 15 MG tablet [[id number]]  Have you contacted your pharmacy to request a refill? Yes   Which pharmacy would you like this sent to?  [id number] [place], [place] - [address] [place] [address] Phone: [phone] Fax: [phone]    Patient notified that their request is being sent to the clinical staff for review and that they should receive a response within 2 business days.   Please advise at [facility] [phone]

## 2023-01-01 NOTE — Telephone Encounter (Signed)
Prescription Request  01/01/2023  LOV: 07/21/22 UPCOMING CPE 01/26/23  What is the name of the medication or equipment? atenolol (TENORMIN) 25 MG tablet [[id number]]  Have you contacted your pharmacy to request a refill? Yes   Which pharmacy would you like this sent to?  [id number] [place], [place] - [address] [place] [address] Phone: [phone] Fax: [phone]    Patient notified that their request is being sent to the clinical staff for review and that they should receive a response within 2 business days.   Please advise at [facility] [phone]

## 2023-01-02 MED ORDER — MELOXICAM 15 MG PO TABS
15.0000 mg | ORAL_TABLET | Freq: Every day | ORAL | 0 refills | Status: DC
Start: 2023-01-02 — End: 2023-03-20

## 2023-01-02 MED ORDER — ATENOLOL 25 MG PO TABS: 25.0000 mg | ORAL_TABLET | Freq: Every day | ORAL | 0 refills | Status: DC

## 2023-01-02 NOTE — Telephone Encounter (Signed)
Requesting refills. Last OV 07/21/22 future visit in 3 weeks.  Requested Prescriptions  Pending Prescriptions Disp Refills   meloxicam (MOBIC) 15 MG tablet 90 tablet 0    Sig: Take 1 tablet (15 mg total) by mouth daily.     Analgesics:  COX2 Inhibitors Failed - 01/02/2023  2:56 PM      Failed - Manual Review: Labs are only required if the patient has taken medication for more than 8 weeks.      Failed - Valid encounter within last 12 months    Recent Outpatient Visits           1 year ago Benign essential HTN   [facility] Medicine Pickard, Priscille Heidelberg, MD   1 year ago Wheezing   [facility] Medicine Pickard, Priscille Heidelberg, MD   1 year ago COVID   [facility] Medicine Pickard, Priscille Heidelberg, MD   2 years ago General medical exam   [facility] Family Medicine Donita Brooks, MD   3 years ago Prostadynia   [facility] Pickard, Priscille Heidelberg, MD       Future Appointments             In 3 weeks Pickard, Priscille Heidelberg, MD [facility], [facility]            Passed - HGB in normal range and within 360 days    Hemoglobin  Date Value Ref Range Status  07/21/2022 14.8 13.2 - 17.1 g/dL Final         Passed - Cr in normal range and within 360 days    Creat  Date Value Ref Range Status  07/21/2022 1.18 0.70 - 1.28 mg/dL Final         Passed - HCT in normal range and within 360 days    HCT  Date Value Ref Range Status  07/21/2022 42.7 38.5 - 50.0 % Final         Passed - AST in normal range and within 360 days    AST  Date Value Ref Range Status  07/21/2022 19 10 - 35 U/L Final         Passed - ALT in normal range and within 360 days    ALT  Date Value Ref Range Status  07/21/2022 22 9 - 46 U/L Final         Passed - eGFR is 30 or above and within 360 days    GFR, Est African American  Date Value Ref Range Status  06/29/2020 72 > OR = 60 mL/min/1.36m2 Final   GFR, Est Non African American  Date Value Ref Range Status   06/29/2020 62 > OR = 60 mL/min/1.28m2 Final   eGFR  Date Value Ref Range Status  07/21/2022 65 > OR = 60 mL/min/1.11m2 Final         Passed - Patient is not pregnant       atenolol (TENORMIN) 25 MG tablet 90 tablet 0    Sig: Take 1 tablet (25 mg total) by mouth daily.     Cardiovascular: Beta Blockers 2 Failed - 01/02/2023  2:56 PM      Failed - Valid encounter within last 6 months    Recent Outpatient Visits           1 year ago Benign essential HTN   [facility] Medicine Pickard, Priscille Heidelberg, MD   1 year ago Wheezing   [facility]  Medicine Donita Brooks, MD   1 year ago COVID   [facility] Medicine Pickard, Priscille Heidelberg, MD   2 years ago General medical exam   [facility] Family Medicine Donita Brooks, MD   3 years ago Prostadynia   [facility] Medicine Tanya Nones, Priscille Heidelberg, MD       Future Appointments             In 3 weeks Tanya Nones, Priscille Heidelberg, MD [facility], [facility]            Passed - Cr in normal range and within 360 days    Creat  Date Value Ref Range Status  07/21/2022 1.18 0.70 - 1.28 mg/dL Final         Passed - Last BP in normal range    BP Readings from Last 1 Encounters:  07/21/22 126/72         Passed - Last Heart Rate in normal range    Pulse Readings from Last 1 Encounters:  07/21/22 (!) 53

## 2023-01-06 ENCOUNTER — Encounter: Payer: Self-pay | Admitting: Internal Medicine

## 2023-01-26 ENCOUNTER — Ambulatory Visit (INDEPENDENT_AMBULATORY_CARE_PROVIDER_SITE_OTHER): Payer: PPO | Admitting: Family Medicine

## 2023-01-26 ENCOUNTER — Other Ambulatory Visit: Payer: Self-pay | Admitting: Family Medicine

## 2023-01-26 ENCOUNTER — Encounter (INDEPENDENT_AMBULATORY_CARE_PROVIDER_SITE_OTHER): Payer: Self-pay

## 2023-01-26 ENCOUNTER — Encounter: Payer: Self-pay | Admitting: Family Medicine

## 2023-01-26 VITALS — BP 142/86 | HR 52 | Temp 98.1°F | Ht 74.0 in | Wt 212.6 lb

## 2023-01-26 DIAGNOSIS — E7800 Pure hypercholesterolemia, unspecified: Secondary | ICD-10-CM | POA: Diagnosis not present

## 2023-01-26 DIAGNOSIS — Z125 Encounter for screening for malignant neoplasm of prostate: Secondary | ICD-10-CM

## 2023-01-26 DIAGNOSIS — Z0001 Encounter for general adult medical examination with abnormal findings: Secondary | ICD-10-CM | POA: Diagnosis not present

## 2023-01-26 DIAGNOSIS — N400 Enlarged prostate without lower urinary tract symptoms: Secondary | ICD-10-CM

## 2023-01-26 DIAGNOSIS — Z0000 Encounter for general adult medical examination without abnormal findings: Secondary | ICD-10-CM

## 2023-01-26 DIAGNOSIS — Z1211 Encounter for screening for malignant neoplasm of colon: Secondary | ICD-10-CM

## 2023-01-26 MED ORDER — ALPRAZOLAM 0.5 MG PO TABS: 0.5000 mg | ORAL_TABLET | Freq: Three times a day (TID) | ORAL | 3 refills | Status: DC | PRN

## 2023-01-26 MED ORDER — ALPRAZOLAM 0.5 MG PO TABS: ORAL_TABLET | ORAL | 3 refills | Status: DC

## 2023-01-26 NOTE — Addendum Note (Signed)
Addended by: Lynnea Ferrier T on: 01/26/2023 11:04 AM   Modules accepted: Orders

## 2023-01-26 NOTE — Addendum Note (Signed)
Addended by: Lynnea Ferrier T on: 01/26/2023 01:54 PM   Modules accepted: Orders

## 2023-01-26 NOTE — Progress Notes (Signed)
Subjective:    Patient ID: Keith Pratt, male    DOB: 1950-09-09, 72 y.o.   MRN: [id number]  HPI  Patient is here today for a complete physical exam.  He denies any concerns.  He does report change in his bowel habits.  He states that it is very hard for him to have a bowel movement.  He feels like it is hard to get clean after a bowel movement.  I performed a rectal exam today.  The patient has a large external hemorrhoid at 2:00.  He also has an enlarged prostate.  I do not appreciate any rectal mass or prostatic nodule.  He is overdue for colonoscopy.  He refuses a colonoscopy but he will consent to Cologuard.  He is also due for prostate cancer screening.  His blood pressure today is borderline at 142/86.  He denies any chest pain shortness of breath or dyspnea on exertion.  He refuses a flu shot, COVID booster, and the shingles vaccine.  Due for fasting lab work. Past Medical History:  Diagnosis Date   AAA (abdominal aortic aneurysm) (HCC)    3.5 cm   Anxiety    Arthritis    Cancer (HCC)    basal cell CA-under left arm   Cataract    [facility]reesia 06/29/2020   COPD (chronic obstructive pulmonary disease) (HCC)    Hyperlipidemia    Hypertension    Shingles    July 2014   Thyroid disease    as a child- no problem as adult   Ulcer    Past Surgical History:  Procedure Laterality Date   CATARACT EXTRACTION     right eye   COLONOSCOPY     EYE SURGERY N/A    [facility]ia 06/29/2020   JOINT REPLACEMENT N/A    [facility]reesia 06/29/2020   right arm surgery     TONSILLECTOMY     Current Outpatient Medications on File Prior to Visit  Medication Sig Dispense Refill   albuterol (VENTOLIN HFA) 108 (90 Base) MCG/ACT inhaler Inhale 2 puffs into the lungs every 6 (six) hours as needed for wheezing or shortness of breath. 8 g 2   ALPRAZolam (XANAX) 0.5 MG tablet TAKE 1 TABLET BY MOUTH UP TO 3 TIMES DAILY AS NEEDED FOR ANXIETY/SLEEP 30 tablet 2   aspirin 81 MG EC tablet aspirin 81 mg tablet,delayed  release  TAKE 1 TABLET BY MOUTH TWICE A DAY     atenolol (TENORMIN) 25 MG tablet Take 1 tablet (25 mg total) by mouth daily. 90 tablet 0   hydrochlorothiazide (HYDRODIURIL) 25 MG tablet TAKE 1 TABLET (25 MG TOTAL) BY MOUTH DAILY. 90 tablet 3   ibuprofen (ADVIL,MOTRIN) 200 MG tablet Take 800 mg by mouth every 6 (six) hours as needed.     losartan (COZAAR) 100 MG tablet Take 1 tablet (100 mg total) by mouth daily. 90 tablet 3   meloxicam (MOBIC) 15 MG tablet Take 1 tablet (15 mg total) by mouth daily. 90 tablet 0   omeprazole (PRILOSEC) 40 MG capsule Take 1 capsule (40 mg total) by mouth daily. 90 capsule 3   sildenafil (VIAGRA) 100 MG tablet TAKE 0.5-1 TABLETS (50-100 MG TOTAL) BY MOUTH DAILY AS NEEDED FOR ERECTILE DYSFUNCTION. 50 tablet 3   No current facility-administered medications on file prior to visit.   Allergies  Allergen Reactions   Cardura [Doxazosin Mesylate] Other (See Comments)    Dizzy, hot all over - per pt "jusr felt sick"   Codeine  REACTION: nauses and vomiting   Amlodipine Swelling    Swelling in feet and ankles   Social History   Socioeconomic History   Marital status: Divorced    Spouse name: Not on file   Number of children: Not on file   Years of education: Not on file   Highest education level: Associate degree: occupational, Scientist, product/process development, or vocational program  Occupational History   Not on file  Tobacco Use   Smoking status: Former    Current packs/day: 0.00    Types: Cigarettes    Quit date: 01/08/2003    Years since quitting: 20.0   Smokeless tobacco: Never  Substance and Sexual Activity   Alcohol use: No   Drug use: No   Sexual activity: Yes  Other Topics Concern   Not on file  Social History Narrative   Not on file   Social Determinants of Health   Financial Resource Strain: Low Risk  (08/03/2022)   Overall Financial Resource Strain (CARDIA)    Difficulty of Paying Living Expenses: Not very hard  Recent Concern: Financial Resource Strain  - Medium Risk (07/20/2022)   Overall Financial Resource Strain (CARDIA)    Difficulty of Paying Living Expenses: Somewhat hard  Food Insecurity: No Food Insecurity (08/03/2022)   Hunger Vital Sign    Worried About Running Out of Food in the Last Year: Never true    Ran Out of Food in the Last Year: Never true  Transportation Needs: No Transportation Needs (08/03/2022)   PRAPARE - Administrator, Civil Service (Medical): No    Lack of Transportation (Non-Medical): No  Physical Activity: Sufficiently Active (08/03/2022)   Exercise Vital Sign    Days of Exercise per Week: 5 days    Minutes of Exercise per Session: 150+ min  Stress: Stress Concern Present (08/03/2022)   [employer] of Occupational Health - Occupational Stress Questionnaire    Feeling of Stress : To some extent  Social Connections: Moderately Integrated (08/03/2022)   Social Connection and Isolation Panel [NHANES]    Frequency of Communication with Friends and Family: Three times a week    Frequency of Social Gatherings with Friends and Family: Twice a week    Attends Religious Services: 1 to 4 times per year    Active Member of [employer] or Organizations: Yes    Attends Banker Meetings: 1 to 4 times per year    Marital Status: Divorced  Intimate Partner Violence: Not At Risk (08/03/2022)   Humiliation, Afraid, Rape, and Kick questionnaire    Fear of Current or Ex-Partner: No    Emotionally Abused: No    Physically Abused: No    Sexually Abused: No      Review of Systems  All other systems reviewed and are negative.      Objective:   Physical Exam Vitals reviewed.  Constitutional:      General: He is not in acute distress.    Appearance: Normal appearance. He is well-developed and normal weight. He is not diaphoretic.  HENT:     Head: Normocephalic and atraumatic.     Right Ear: Tympanic membrane, ear canal and external ear normal.     Left Ear: Tympanic membrane, ear canal and external  ear normal.     Nose: Nose normal. No congestion or rhinorrhea.  Eyes:     General: No scleral icterus.       Right eye: No discharge.        Left eye: No  discharge.     Conjunctiva/sclera: Conjunctivae normal.     Pupils: Pupils are equal, round, and reactive to light.  Neck:     Thyroid: No thyromegaly.     Vascular: No carotid bruit or JVD.     Trachea: No tracheal deviation.  Cardiovascular:     Rate and Rhythm: Normal rate and regular rhythm.     Pulses: Normal pulses.     Heart sounds: Normal heart sounds. No murmur heard.    No friction rub. No gallop.  Pulmonary:     Effort: Pulmonary effort is normal. No respiratory distress.     Breath sounds: Normal breath sounds. No stridor. No wheezing or rales.  Chest:     Chest wall: No tenderness.  Abdominal:     General: Bowel sounds are normal. There is no distension.     Palpations: Abdomen is soft. There is no mass.     Tenderness: There is no abdominal tenderness. There is no guarding or rebound.  Genitourinary:    Prostate: Enlarged. Not tender and no nodules present.     Rectum: External hemorrhoid present. No mass.  Musculoskeletal:     Cervical back: Neck supple. No rigidity.     Right lower leg: No edema.     Left lower leg: No edema.  Lymphadenopathy:     Cervical: No cervical adenopathy.  Skin:    General: Skin is warm.     Coloration: Skin is not pale.     Findings: No erythema or rash.  Neurological:     Mental Status: He is alert and oriented to person, place, and time.     Cranial Nerves: No cranial nerve deficit.     Motor: No abnormal muscle tone.     Coordination: Coordination normal.     Deep Tendon Reflexes: Reflexes are normal and symmetric.  Psychiatric:        Behavior: Behavior normal.        Thought Content: Thought content normal.        Judgment: Judgment normal.           Assessment & Plan:  Colon cancer screening - Plan: Cologuard  Prostate cancer screening - Plan: PSA  Pure  hypercholesterolemia - Plan: CBC with Differential/Platelet, COMPLETE METABOLIC PANEL WITH GFR, Lipid panel  General medical exam  Benign prostatic hyperplasia without lower urinary tract symptoms Blood pressure today is borderline.  Continue to monitor this.  Recommended a flu shot, COVID shot, and a shingles vaccine.  Patient declines these.  He declines a colonoscopy but he does consent to Cologuard.  Given his enlarged prostate I will check a PSA but I do not appreciate any nodularity.  Check a CBC a CMP and a lipid panel.  Goal LDL cholesterol is less than 100.  Regular anticipatory guidance is provided

## 2023-01-26 NOTE — Addendum Note (Signed)
Addended by: Lynnea Ferrier T on: 01/26/2023 11:05 AM   Modules accepted: Orders

## 2023-01-27 LAB — COMPLETE METABOLIC PANEL WITH GFR
AG Ratio: 1.5 (calc) (ref 1.0–2.5)
ALT: 26 U/L (ref 9–46)
AST: 22 U/L (ref 10–35)
Albumin: 4.4 g/dL (ref 3.6–5.1)
Alkaline phosphatase (APISO): 67 U/L (ref 35–144)
BUN/Creatinine Ratio: 14 (calc) (ref 6–22)
BUN: 18 mg/dL (ref 7–25)
CO2: 29 mmol/L (ref 20–32)
Calcium: 9.5 mg/dL (ref 8.6–10.3)
Chloride: 99 mmol/L (ref 98–110)
Creat: 1.33 mg/dL — ABNORMAL HIGH (ref 0.70–1.28)
Globulin: 2.9 g/dL (ref 1.9–3.7)
Glucose, Bld: 72 mg/dL (ref 65–99)
Potassium: 3.6 mmol/L (ref 3.5–5.3)
Sodium: 139 mmol/L (ref 135–146)
Total Bilirubin: 0.6 mg/dL (ref 0.2–1.2)
Total Protein: 7.3 g/dL (ref 6.1–8.1)
eGFR: 56 mL/min/{1.73_m2} — ABNORMAL LOW (ref >=60)

## 2023-01-27 LAB — LIPID PANEL
Cholesterol: 157 mg/dL (ref <200)
HDL: 32 mg/dL — ABNORMAL LOW (ref >=40)
LDL Cholesterol (Calc): 86 mg/dL
Non-HDL Cholesterol (Calc): 125 mg/dL (ref <130)
Total CHOL/HDL Ratio: 4.9 (calc) (ref <5.0)
Triglycerides: 325 mg/dL — ABNORMAL HIGH (ref <150)

## 2023-01-27 LAB — CBC WITH DIFFERENTIAL/PLATELET
Absolute Monocytes: 608 {cells}/uL (ref 200–950)
Basophils Absolute: 120 {cells}/uL (ref 0–200)
Basophils Relative: 1.5 %
Eosinophils Absolute: 624 {cells}/uL — ABNORMAL HIGH (ref 15–500)
Eosinophils Relative: 7.8 %
HCT: 43.4 % (ref 38.5–50.0)
Hemoglobin: 15.1 g/dL (ref 13.2–17.1)
Lymphs Abs: 2440 {cells}/uL (ref 850–3900)
MCH: 31.1 pg (ref 27.0–33.0)
MCHC: 34.8 g/dL (ref 32.0–36.0)
MCV: 89.3 fL (ref 80.0–100.0)
MPV: 9.9 fL (ref 7.5–12.5)
Monocytes Relative: 7.6 %
Neutro Abs: 4208 {cells}/uL (ref 1500–7800)
Neutrophils Relative %: 52.6 %
Platelets: 248 10*3/uL (ref 140–400)
RBC: 4.86 10*6/uL (ref 4.20–5.80)
RDW: 12.5 % (ref 11.0–15.0)
Total Lymphocyte: 30.5 %
WBC: 8 10*3/uL (ref 3.8–10.8)

## 2023-01-27 LAB — PSA: PSA: 4.47 ng/mL — ABNORMAL HIGH (ref <=4.00)

## 2023-01-30 ENCOUNTER — Other Ambulatory Visit: Payer: Self-pay

## 2023-01-30 DIAGNOSIS — Z125 Encounter for screening for malignant neoplasm of prostate: Secondary | ICD-10-CM

## 2023-02-02 DIAGNOSIS — Z1211 Encounter for screening for malignant neoplasm of colon: Secondary | ICD-10-CM | POA: Diagnosis not present

## 2023-02-09 LAB — COLOGUARD: COLOGUARD: NEGATIVE

## 2023-03-13 ENCOUNTER — Other Ambulatory Visit: Payer: Self-pay | Admitting: Family Medicine

## 2023-03-13 DIAGNOSIS — I10 Essential (primary) hypertension: Secondary | ICD-10-CM

## 2023-03-13 NOTE — Telephone Encounter (Signed)
Requested Prescriptions  Pending Prescriptions Disp Refills   losartan (COZAAR) 100 MG tablet [Pharmacy Med Name: LOSARTAN POTASSIUM 100 MG TAB] 90 tablet 3    Sig: TAKE 1 TABLET BY MOUTH EVERY DAY     Cardiovascular:  Angiotensin Receptor Blockers Failed - 03/13/2023  1:35 AM      Failed - Cr in normal range and within 180 days    Creat  Date Value Ref Range Status  01/26/2023 1.33 (H) 0.70 - 1.28 mg/dL Final         Failed - Last BP in normal range    BP Readings from Last 1 Encounters:  01/26/23 (!) 142/86         Failed - Valid encounter within last 6 months    Recent Outpatient Visits           1 year ago Benign essential HTN   [facility] Donita Brooks, MD   1 year ago Wheezing   [facility] Tanya Nones, Priscille Heidelberg, MD   2 years ago COVID   [facility] Pickard, Priscille Heidelberg, MD   2 years ago General medical exam   [facility] Donita Brooks, MD   3 years ago Prostadynia   [facility] Pickard, Priscille Heidelberg, MD       Future Appointments             In 10 months Pickard, Priscille Heidelberg, MD [place] [facility]            Passed - K in normal range and within 180 days    Potassium  Date Value Ref Range Status  01/26/2023 3.6 3.5 - 5.3 mmol/L Final         Passed - Patient is not pregnant

## 2023-03-20 ENCOUNTER — Other Ambulatory Visit: Payer: Self-pay

## 2023-03-20 DIAGNOSIS — M5441 Lumbago with sciatica, right side: Secondary | ICD-10-CM

## 2023-03-20 MED ORDER — MELOXICAM 15 MG PO TABS: 15.0000 mg | ORAL_TABLET | Freq: Every day | ORAL | 0 refills | Status: DC

## 2023-03-31 ENCOUNTER — Other Ambulatory Visit: Payer: Self-pay | Admitting: Family Medicine

## 2023-04-02 NOTE — Telephone Encounter (Signed)
Requested Prescriptions  Pending Prescriptions Disp Refills   atenolol (TENORMIN) 25 MG tablet [Pharmacy Med Name: ATENOLOL 25 MG TABLET] 90 tablet 0    Sig: TAKE 1 TABLET (25 MG TOTAL) BY MOUTH DAILY.     Cardiovascular: Beta Blockers 2 Failed - 04/02/2023  9:20 AM      Failed - Cr in normal range and within 360 days    Creat  Date Value Ref Range Status  01/26/2023 1.33 (H) 0.70 - 1.28 mg/dL Final         Failed - Last BP in normal range    BP Readings from Last 1 Encounters:  01/26/23 (!) 142/86         Failed - Valid encounter within last 6 months    Recent Outpatient Visits           1 year ago Benign essential HTN   [facility] Tanya Nones, Priscille Heidelberg, MD   1 year ago Wheezing   [facility] Tanya Nones, Priscille Heidelberg, MD   2 years ago COVID   [facility] Pickard, Priscille Heidelberg, MD   2 years ago General medical exam   [facility] Donita Brooks, MD   3 years ago Prostadynia   [facility] Pickard, Priscille Heidelberg, MD       Future Appointments             In 10 months Pickard, Priscille Heidelberg, MD [facility], [facility]            Passed - Last Heart Rate in normal range    Pulse Readings from Last 1 Encounters:  01/26/23 (!) 52

## 2023-06-13 ENCOUNTER — Telehealth: Payer: Self-pay

## 2023-06-13 NOTE — Telephone Encounter (Signed)
 Copied from CRM [id number]. Topic: Clinical - Medication Refill >> Jun 13, 2023  2:14 PM Ivette P wrote: Most Recent Primary Care Visit:  Provider: Lynnea Ferrier T  Department: [facility]  Visit Type: PHYSICAL  Date: 01/26/2023  Medication: TADALASIL, pt spoke to insurance and this prescription is covered by [employer]. Other brands are not covered by [employer].   Has the patient contacted their pharmacy? No (Agent: If no, request that the patient contact the pharmacy for the refill. If patient does not wish to contact the pharmacy document the reason why and proceed with request.) (Agent: If yes, when and what did the pharmacy advise?)  Is this the correct pharmacy for this prescription? Yes If no, delete pharmacy and type the correct one.  This is the patient's preferred pharmacy:  [id number] [place], [place] - [address] [place] [address] Phone: [phone] Fax: [phone]   Has the prescription been filled recently? No  Is the patient out of the medication? No  Has the patient been seen for an appointment in the last year OR does the patient have an upcoming appointment? Yes  Can we respond through MyChart? Yes  Agent: Please be advised that Rx refills may take up to 3 business days. We ask that you follow-up with your pharmacy.

## 2023-06-14 ENCOUNTER — Other Ambulatory Visit: Payer: Self-pay | Admitting: Family Medicine

## 2023-06-14 MED ORDER — TADALAFIL 20 MG PO TABS
10.0000 mg | ORAL_TABLET | ORAL | 11 refills | Status: DC | PRN
Start: 2023-06-14 — End: 2023-12-10

## 2023-06-20 DIAGNOSIS — R972 Elevated prostate specific antigen [PSA]: Secondary | ICD-10-CM | POA: Diagnosis not present

## 2023-06-20 DIAGNOSIS — N401 Enlarged prostate with lower urinary tract symptoms: Secondary | ICD-10-CM | POA: Diagnosis not present

## 2023-06-20 DIAGNOSIS — R3914 Feeling of incomplete bladder emptying: Secondary | ICD-10-CM | POA: Diagnosis not present

## 2023-06-21 ENCOUNTER — Emergency Department (HOSPITAL_COMMUNITY)
Admission: EM | Admit: 2023-06-21 | Discharge: 2023-06-21 | Disposition: A | Discharge status: Home / Self Care | Attending: Emergency Medicine | Admitting: Emergency Medicine

## 2023-06-21 ENCOUNTER — Encounter (HOSPITAL_COMMUNITY): Payer: Self-pay

## 2023-06-21 ENCOUNTER — Ambulatory Visit: Admitting: Family Medicine

## 2023-06-21 ENCOUNTER — Encounter: Payer: Self-pay | Admitting: Family Medicine

## 2023-06-21 ENCOUNTER — Other Ambulatory Visit: Payer: Self-pay

## 2023-06-21 ENCOUNTER — Emergency Department (HOSPITAL_COMMUNITY)

## 2023-06-21 VITALS — BP 205/90 | HR 58 | Temp 98.0°F | Ht 74.0 in | Wt 218.0 lb

## 2023-06-21 DIAGNOSIS — N400 Enlarged prostate without lower urinary tract symptoms: Secondary | ICD-10-CM | POA: Diagnosis not present

## 2023-06-21 DIAGNOSIS — I10 Essential (primary) hypertension: Secondary | ICD-10-CM | POA: Diagnosis not present

## 2023-06-21 DIAGNOSIS — Z7982 Long term (current) use of aspirin: Secondary | ICD-10-CM | POA: Insufficient documentation

## 2023-06-21 DIAGNOSIS — M25519 Pain in unspecified shoulder: Secondary | ICD-10-CM | POA: Diagnosis not present

## 2023-06-21 DIAGNOSIS — J449 Chronic obstructive pulmonary disease, unspecified: Secondary | ICD-10-CM | POA: Diagnosis not present

## 2023-06-21 DIAGNOSIS — E876 Hypokalemia: Secondary | ICD-10-CM | POA: Insufficient documentation

## 2023-06-21 DIAGNOSIS — R0789 Other chest pain: Secondary | ICD-10-CM | POA: Diagnosis not present

## 2023-06-21 DIAGNOSIS — Z87891 Personal history of nicotine dependence: Secondary | ICD-10-CM | POA: Diagnosis not present

## 2023-06-21 DIAGNOSIS — R519 Headache, unspecified: Secondary | ICD-10-CM | POA: Diagnosis not present

## 2023-06-21 DIAGNOSIS — E079 Disorder of thyroid, unspecified: Secondary | ICD-10-CM | POA: Insufficient documentation

## 2023-06-21 DIAGNOSIS — R946 Abnormal results of thyroid function studies: Secondary | ICD-10-CM | POA: Insufficient documentation

## 2023-06-21 DIAGNOSIS — Z79899 Other long term (current) drug therapy: Secondary | ICD-10-CM | POA: Insufficient documentation

## 2023-06-21 DIAGNOSIS — I160 Hypertensive urgency: Secondary | ICD-10-CM

## 2023-06-21 DIAGNOSIS — Z8589 Personal history of malignant neoplasm of other organs and systems: Secondary | ICD-10-CM | POA: Insufficient documentation

## 2023-06-21 DIAGNOSIS — R7989 Other specified abnormal findings of blood chemistry: Secondary | ICD-10-CM | POA: Diagnosis not present

## 2023-06-21 DIAGNOSIS — R079 Chest pain, unspecified: Secondary | ICD-10-CM | POA: Diagnosis not present

## 2023-06-21 DIAGNOSIS — I249 Acute ischemic heart disease, unspecified: Secondary | ICD-10-CM | POA: Insufficient documentation

## 2023-06-21 DIAGNOSIS — E039 Hypothyroidism, unspecified: Secondary | ICD-10-CM | POA: Insufficient documentation

## 2023-06-21 DIAGNOSIS — Z7951 Long term (current) use of inhaled steroids: Secondary | ICD-10-CM | POA: Insufficient documentation

## 2023-06-21 DIAGNOSIS — I4510 Unspecified right bundle-branch block: Secondary | ICD-10-CM | POA: Diagnosis not present

## 2023-06-21 DIAGNOSIS — R0602 Shortness of breath: Secondary | ICD-10-CM | POA: Insufficient documentation

## 2023-06-21 DIAGNOSIS — G4489 Other headache syndrome: Secondary | ICD-10-CM | POA: Diagnosis not present

## 2023-06-21 LAB — CBC WITH DIFFERENTIAL/PLATELET
Abs Immature Granulocytes: 0.02 10*3/uL (ref 0.00–0.07)
Basophils Absolute: 0.1 10*3/uL (ref 0.0–0.1)
Basophils Relative: 1 %
Eosinophils Absolute: 0.3 10*3/uL (ref 0.0–0.5)
Eosinophils Relative: 4 %
HCT: 39.8 % (ref 39.0–52.0)
Hemoglobin: 14.4 g/dL (ref 13.0–17.0)
Immature Granulocytes: 0 %
Lymphocytes Relative: 28 %
Lymphs Abs: 2.4 10*3/uL (ref 0.7–4.0)
MCH: 30.3 pg (ref 26.0–34.0)
MCHC: 36.2 g/dL — ABNORMAL HIGH (ref 30.0–36.0)
MCV: 83.8 fL (ref 80.0–100.0)
Monocytes Absolute: 0.5 10*3/uL (ref 0.1–1.0)
Monocytes Relative: 6 %
Neutro Abs: 5.3 10*3/uL (ref 1.7–7.7)
Neutrophils Relative %: 61 %
Platelets: 214 10*3/uL (ref 150–400)
RBC: 4.75 MIL/uL (ref 4.22–5.81)
RDW: 11.7 % (ref 11.5–15.5)
WBC: 8.6 10*3/uL (ref 4.0–10.5)
nRBC: 0 % (ref 0.0–0.2)

## 2023-06-21 LAB — T4, FREE: Free T4: 1 ng/dL (ref 0.61–1.12)

## 2023-06-21 LAB — RESP PANEL BY RT-PCR (RSV, FLU A&B, COVID)  RVPGX2
Influenza A by PCR: NEGATIVE
Influenza B by PCR: NEGATIVE
Resp Syncytial Virus by PCR: NEGATIVE
SARS Coronavirus 2 by RT PCR: NEGATIVE

## 2023-06-21 LAB — TSH: TSH: 5.099 u[IU]/mL — ABNORMAL HIGH (ref 0.350–4.500)

## 2023-06-21 LAB — COMPREHENSIVE METABOLIC PANEL
ALT: 46 U/L — ABNORMAL HIGH (ref 0–44)
AST: 35 U/L (ref 15–41)
Albumin: 4 g/dL (ref 3.5–5.0)
Alkaline Phosphatase: 48 U/L (ref 38–126)
Anion gap: 13 (ref 5–15)
BUN: 17 mg/dL (ref 8–23)
CO2: 30 mmol/L (ref 22–32)
Calcium: 9.3 mg/dL (ref 8.9–10.3)
Chloride: 94 mmol/L — ABNORMAL LOW (ref 98–111)
Creatinine, Ser: 1.39 mg/dL — ABNORMAL HIGH (ref 0.61–1.24)
GFR, Estimated: 53 mL/min — ABNORMAL LOW (ref >60)
Glucose, Bld: 129 mg/dL — ABNORMAL HIGH (ref 70–99)
Potassium: 2.8 mmol/L — ABNORMAL LOW (ref 3.5–5.1)
Sodium: 137 mmol/L (ref 135–145)
Total Bilirubin: 1.1 mg/dL (ref 0.0–1.2)
Total Protein: 7.3 g/dL (ref 6.5–8.1)

## 2023-06-21 LAB — TROPONIN I (HIGH SENSITIVITY)
Troponin I (High Sensitivity): 17 ng/L (ref <18)
Troponin I (High Sensitivity): 14 ng/L (ref <18)

## 2023-06-21 MED ORDER — HYDRALAZINE HCL 20 MG/ML IJ SOLN
5.0000 mg | Freq: Once | INTRAMUSCULAR | Status: AC
  Administered 2023-06-21: 5 mg via INTRAVENOUS
  Filled 2023-06-21: qty 1

## 2023-06-21 MED ORDER — POTASSIUM CHLORIDE CRYS ER 20 MEQ PO TBCR: 20.0000 meq | EXTENDED_RELEASE_TABLET | Freq: Every day | ORAL | 0 refills | Status: DC

## 2023-06-21 MED ORDER — LOSARTAN POTASSIUM 50 MG PO TABS
50.0000 mg | ORAL_TABLET | Freq: Once | ORAL | Status: AC
  Administered 2023-06-21: 50 mg via ORAL
  Filled 2023-06-21: qty 1

## 2023-06-21 MED ORDER — HYDRALAZINE HCL 20 MG/ML IJ SOLN
5.0000 mg | Freq: Once | INTRAMUSCULAR | Status: AC
  Administered 2023-06-21: 5 mg via INTRAVENOUS

## 2023-06-21 MED ORDER — ALPRAZOLAM 0.25 MG PO TABS
0.5000 mg | ORAL_TABLET | Freq: Once | ORAL | Status: AC
  Administered 2023-06-21: 0.5 mg via ORAL
  Filled 2023-06-21: qty 2

## 2023-06-21 MED ORDER — SODIUM CHLORIDE 0.9 % IV BOLUS
500.0000 mL | Freq: Once | INTRAVENOUS | Status: AC
  Administered 2023-06-21: 500 mL via INTRAVENOUS

## 2023-06-21 MED ORDER — MAGNESIUM OXIDE -MG SUPPLEMENT 400 (240 MG) MG PO TABS
400.0000 mg | ORAL_TABLET | Freq: Once | ORAL | Status: AC
  Administered 2023-06-21: 400 mg via ORAL
  Filled 2023-06-21: qty 1

## 2023-06-21 MED ORDER — POTASSIUM CHLORIDE CRYS ER 20 MEQ PO TBCR
40.0000 meq | EXTENDED_RELEASE_TABLET | Freq: Once | ORAL | Status: AC
  Administered 2023-06-21: 40 meq via ORAL
  Filled 2023-06-21: qty 2

## 2023-06-21 NOTE — Discharge Instructions (Addendum)
 Please follow-up with PCP in regards to your home blood pressure medication regimen.  Cut back on caffeine, eat low-salt diet, heart healthy diet.  Your thyroid hormone was mildly elevated, please follow-up with your PCP for recheck.   Your potassium level was mildly reduced, recommend you have this repeated when you follow-up with your PCP to make sure it has normalized.   Return to the Emergency Department if you have unusual chest pain, pressure, or discomfort, shortness of breath, nausea, vomiting, burping, heartburn, tingling upper body parts, sweating, cold, clammy skin, or racing heartbeat. Call 911 if you think you are having a heart attack. Follow cardiac diet - avoid fatty & fried foods, don't eat too much red meat, eat lots of fruits & vegetables, and dairy products should be low fat. Please lose weight if you are overweight. Become more active with walking, gardening, or any other activity that gets you to moving.   Please return to the emergency department immediately for any new or concerning symptoms, or if you get worse.

## 2023-06-21 NOTE — Assessment & Plan Note (Signed)
 BP >200/100 in office today as well as yesterday at Oncology. This is new for him. He is exeriencing symptoms including left shoulder pain, lightheadedness, dizziness, dyspnea with exertion. History AAA. EKG showed sinus brady with PVCs and first degree block as well as t wave inversion. With reluctance he agreed to proceed to ED with EMS for further workup and management. He is alert and oriented on transfer with vital signs otherwise stable.

## 2023-06-21 NOTE — ED Triage Notes (Signed)
 Patient bib [facility] from PCP for hypertension with headache and blurred vision that started yesterday. Patient also reports intermittent shooting chest pain. EMS reports EKG showed sinus brady with 1st degree block, with PVC's and a possible sinus pause.   PMH: prostate cancer, possible AAA.   Patient A&Ox4 on arrival to ED.

## 2023-06-21 NOTE — Progress Notes (Signed)
 Subjective:  HPI: Keith Pratt is a 73 y.o. male presenting on 06/21/2023 for No chief complaint on file.   HPI Patient is in today for blood pressure follow up. He reports yesterday at Oncology his BP was 211/89 and 200/101. He reports feeling a little "ragged" trying to park and get into his appt and went into the wrong building so it was a stressful time getting to that appt. He does confirm he takes atenolol, hydrochlorothiazide, and Losartan as prescribed. No recent history of medication changes. Does not monitor BP at home. He endorses recent history of worsening dyspnea with exertion and sharp pains that shoot across his chest. Currently experiencing left posterior shoulder pain and lightheadedness and dizziness.  History of AAA noted in medical history however he is unaware of this and I cannot find imaging supporting this in his records.   Review of Systems  All other systems reviewed and are negative.   Relevant past medical history reviewed and updated as indicated.   Past Medical History:  Diagnosis Date   AAA (abdominal aortic aneurysm) (HCC)    3.5 cm   Anxiety    Arthritis    Cancer (HCC)    basal cell CA-under left arm   Cataract    [facility]reesia 06/29/2020   COPD (chronic obstructive pulmonary disease) (HCC)    Hyperlipidemia    Hypertension    Shingles    July 2014   Thyroid disease    as a child- no problem as adult   Ulcer      Past Surgical History:  Procedure Laterality Date   CATARACT EXTRACTION     right eye   COLONOSCOPY     EYE SURGERY N/A    [facility]reesia 06/29/2020   JOINT REPLACEMENT N/A    [facility]reesia 06/29/2020   right arm surgery     TONSILLECTOMY      Allergies and medications reviewed and updated.   Current Outpatient Medications:    albuterol (VENTOLIN HFA) 108 (90 Base) MCG/ACT inhaler, Inhale 2 puffs into the lungs every 6 (six) hours as needed for wheezing or shortness of breath., Disp: 8 g, Rfl: 2   ALPRAZolam (XANAX) 0.5 MG  tablet, Take 1 tablet (0.5 mg total) by mouth 3 (three) times daily as needed for anxiety., Disp: 30 tablet, Rfl: 3   aspirin 81 MG EC tablet, aspirin 81 mg tablet,delayed release  TAKE 1 TABLET BY MOUTH TWICE A DAY, Disp: , Rfl:    atenolol (TENORMIN) 25 MG tablet, TAKE 1 TABLET (25 MG TOTAL) BY MOUTH DAILY., Disp: 90 tablet, Rfl: 0   hydrochlorothiazide (HYDRODIURIL) 25 MG tablet, TAKE 1 TABLET (25 MG TOTAL) BY MOUTH DAILY., Disp: 90 tablet, Rfl: 3   ibuprofen (ADVIL,MOTRIN) 200 MG tablet, Take 800 mg by mouth every 6 (six) hours as needed., Disp: , Rfl:    losartan (COZAAR) 100 MG tablet, TAKE 1 TABLET BY MOUTH EVERY DAY, Disp: 90 tablet, Rfl: 1   Magnesium Oxide (MAG-CAPS PO), Take by mouth. 250 mg 1 tablets daily, Disp: , Rfl:    meloxicam (MOBIC) 15 MG tablet, Take 1 tablet (15 mg total) by mouth daily., Disp: 90 tablet, Rfl: 0   omeprazole (PRILOSEC) 40 MG capsule, Take 1 capsule (40 mg total) by mouth daily., Disp: 90 capsule, Rfl: 3   tadalafil (CIALIS) 20 MG tablet, Take 0.5-1 tablets (10-20 mg total) by mouth every other day as needed for erectile dysfunction., Disp: 10 tablet, Rfl: 11  Allergies  Allergen Reactions  Cardura [Doxazosin Mesylate] Other (See Comments)    Dizzy, hot all over - per pt "jusr felt sick"   Codeine     REACTION: nauses and vomiting   Amlodipine Swelling    Swelling in feet and ankles    Objective:   BP (!) 205/90   Pulse (!) 58   Temp 98 F (36.7 C) (Oral)   Ht 6\' 2"  (1.88 m)   Wt 218 lb (98.9 kg)   SpO2 96%   BMI 27.99 kg/m      06/21/2023    2:20 PM 06/21/2023    2:07 PM 06/21/2023    2:02 PM  Vitals with BMI  Height   6\' 2"   Weight   218 lbs  BMI   27.98  Systolic 205 176 403  Diastolic 90 82 84  Pulse   58     Physical Exam Vitals and nursing note reviewed.  Constitutional:      Appearance: Normal appearance. He is normal weight.  HENT:     Head: Normocephalic and atraumatic.  Cardiovascular:     Rate and Rhythm: Bradycardia  present. Rhythm irregular.     Pulses: Normal pulses.     Heart sounds: Normal heart sounds.  Pulmonary:     Effort: Pulmonary effort is normal.     Breath sounds: Normal breath sounds.  Skin:    General: Skin is warm and dry.     Capillary Refill: Capillary refill takes less than 2 seconds.  Neurological:     General: No focal deficit present.     Mental Status: He is alert and oriented to person, place, and time. Mental status is at baseline.  Psychiatric:        Mood and Affect: Mood normal.        Behavior: Behavior normal.        Thought Content: Thought content normal.        Judgment: Judgment normal.     Assessment & Plan:  Hypertensive urgency Assessment & Plan: BP >200/100 in office today as well as yesterday at Oncology. This is new for him. He is exeriencing symptoms including left shoulder pain, lightheadedness, dizziness, dyspnea with exertion. History AAA. EKG showed sinus brady with PVCs and first degree block as well as t wave inversion. With reluctance he agreed to proceed to ED with EMS for further workup and management. He is alert and oriented on transfer with vital signs otherwise stable.      Follow up plan: Return with PCP after hospital DC.  Park Meo, FNP

## 2023-06-21 NOTE — ED Provider Notes (Signed)
 [place] EMERGENCY DEPARTMENT AT [facility] Provider Note  CSN: [id number] Arrival date & time: 06/21/23 1539  Chief Complaint(s) Hypertension and Chest Pain  HPI Keith Pratt is a 73 y.o. male with past medical history as below, significant for AAA, anxiety, COPD, HLD, HTN, thyroid disease who presents to the ED with complaint of elevated blood pressure, chest pain  Patient was sent to urology to evaluate his enlarged prostate, wall checking again he has noted to have elevated blood pressure and sent to the ED for evaluation.  Patient reports intermittent chest pain over the past few days, stabbing sensation intermittently does not seem to be provoked by exertion.  Does report few days ago he was moving some branches in his yard, he felt very short of breath and did sit down and rest, no chest pain at time, symptoms resolved after resting for few minutes.  He has some mild chest tightness currently but no discrete chest pain.  No dyspnea.  No lightheadedness or near syncope.  No vomiting or nausea.  No change in bowel or bladder function.  He is compliant with her medications.  Including losartan, HCTZ and atenolol.  No abdominal pain  Past Medical History Past Medical History:  Diagnosis Date   AAA (abdominal aortic aneurysm) (HCC)    3.5 cm   Anxiety    Arthritis    Cancer (HCC)    basal cell CA-under left arm   Cataract    Phreesia 06/29/2020   COPD (chronic obstructive pulmonary disease) (HCC)    Hyperlipidemia    Hypertension    Shingles    July 2014   Thyroid disease    as a child- no problem as adult   Ulcer    Patient Active Problem List   Diagnosis Date Noted   Hypertensive urgency 06/21/2023   Right knee pain 10/08/2013   Hyperlipidemia    Hypertension    Home Medication(s) Prior to Admission medications   Medication Sig Start Date End Date Taking? Authorizing Provider  potassium chloride SA (KLOR-CON M) 20 MEQ tablet Take 1 tablet (20 mEq  total) by mouth daily. 06/21/23  Yes Tanda Rockers A, DO  albuterol (VENTOLIN HFA) 108 (90 Base) MCG/ACT inhaler Inhale 2 puffs into the lungs every 6 (six) hours as needed for wheezing or shortness of breath. 04/04/21   Donita Brooks, MD  ALPRAZolam Prudy Feeler) 0.5 MG tablet Take 1 tablet (0.5 mg total) by mouth 3 (three) times daily as needed for anxiety. 01/26/23   Donita Brooks, MD  aspirin 81 MG EC tablet aspirin 81 mg tablet,delayed release  TAKE 1 TABLET BY MOUTH TWICE A DAY    [provider]  atenolol (TENORMIN) 25 MG tablet TAKE 1 TABLET (25 MG TOTAL) BY MOUTH DAILY. 04/02/23   Donita Brooks, MD  hydrochlorothiazide (HYDRODIURIL) 25 MG tablet TAKE 1 TABLET (25 MG TOTAL) BY MOUTH DAILY. 07/27/22   Donita Brooks, MD  ibuprofen (ADVIL,MOTRIN) 200 MG tablet Take 800 mg by mouth every 6 (six) hours as needed.    [provider]  losartan (COZAAR) 100 MG tablet TAKE 1 TABLET BY MOUTH EVERY DAY 03/13/23   Donita Brooks, MD  Magnesium Oxide (MAG-CAPS PO) Take by mouth. 250 mg 1 tablets daily    [provider]  meloxicam (MOBIC) 15 MG tablet Take 1 tablet (15 mg total) by mouth daily. 03/20/23   Donita Brooks, MD  omeprazole (PRILOSEC) 40 MG capsule Take 1 capsule (40  mg total) by mouth daily. 09/19/22   Donita Brooks, MD  tadalafil (CIALIS) 20 MG tablet Take 0.5-1 tablets (10-20 mg total) by mouth every other day as needed for erectile dysfunction. 06/14/23   Donita Brooks, MD                                                                                                                                    Past Surgical History Past Surgical History:  Procedure Laterality Date   CATARACT EXTRACTION     right eye   COLONOSCOPY     EYE SURGERY N/A    Phreesia 06/29/2020   JOINT REPLACEMENT N/A    Phreesia 06/29/2020   right arm surgery     TONSILLECTOMY     Family History Family History  Problem Relation Age of Onset   Colon polyps Mother     Colon cancer Neg Hx    Esophageal cancer Neg Hx    Rectal cancer Neg Hx    Stomach cancer Neg Hx     Social History Social History   Tobacco Use   Smoking status: Former    Current packs/day: 0.00    Types: Cigarettes    Quit date: 01/08/2003    Years since quitting: 20.4   Smokeless tobacco: Never  Substance Use Topics   Alcohol use: No   Drug use: No   Allergies Cardura [doxazosin mesylate], Codeine, and Amlodipine  Review of Systems A thorough review of systems was obtained and all systems are negative except as noted in the HPI and PMH.   Physical Exam Vital Signs  I have reviewed the triage vital signs BP (!) 157/82   Pulse 69   Temp 98 F (36.7 C) (Oral)   Resp 13   Ht 6\' 2"  (1.88 m)   Wt 98.9 kg   SpO2 100%   BMI 27.99 kg/m  Physical Exam Vitals and nursing note reviewed.  Constitutional:      General: He is not in acute distress.    Appearance: He is well-developed. He is not ill-appearing.  HENT:     Head: Normocephalic and atraumatic.     Right Ear: External ear normal.     Left Ear: External ear normal.     Mouth/Throat:     Mouth: Mucous membranes are moist.  Eyes:     General: No scleral icterus. Cardiovascular:     Rate and Rhythm: Regular rhythm. Bradycardia present.     Pulses: Normal pulses.     Heart sounds: Normal heart sounds.  Pulmonary:     Effort: Pulmonary effort is normal. No respiratory distress.     Breath sounds: Normal breath sounds.  Abdominal:     General: Abdomen is flat.     Palpations: Abdomen is soft.     Tenderness: There is no abdominal tenderness.  Musculoskeletal:     Cervical back: No rigidity.  Right lower leg: No edema.     Left lower leg: No edema.  Skin:    General: Skin is warm and dry.     Capillary Refill: Capillary refill takes less than 2 seconds.  Neurological:     Mental Status: He is alert.  Psychiatric:        Mood and Affect: Mood normal.        Behavior: Behavior normal.     ED  Results and Treatments Labs (all labs ordered are listed, but only abnormal results are displayed) Labs Reviewed  CBC WITH DIFFERENTIAL/PLATELET - Abnormal; Notable for the following components:      Result Value   MCHC 36.2 (*)    All other components within normal limits  COMPREHENSIVE METABOLIC PANEL - Abnormal; Notable for the following components:   Potassium 2.8 (*)    Chloride 94 (*)    Glucose, Bld 129 (*)    Creatinine, Ser 1.39 (*)    ALT 46 (*)    GFR, Estimated 53 (*)    All other components within normal limits  TSH - Abnormal; Notable for the following components:   TSH 5.099 (*)    All other components within normal limits  RESP PANEL BY RT-PCR (RSV, FLU A&B, COVID)  RVPGX2  T4, FREE  TROPONIN I (HIGH SENSITIVITY)  TROPONIN I (HIGH SENSITIVITY)                                                                                                                          Radiology DG Chest Port 1 View Result Date: 06/21/2023 CLINICAL DATA:  Intermittent chest pain with headache and blurred vision x1 day. EXAM: PORTABLE CHEST 1 VIEW COMPARISON:  May 30, 2018 FINDINGS: The heart size and mediastinal contours are within normal limits. Mild atelectatic changes are suspected within the left lung base. There is no evidence of focal consolidation, pleural effusion or pneumothorax. The visualized skeletal structures are unremarkable. IMPRESSION: No active disease. Electronically Signed   By: Aram Candela M.D.   On: 06/21/2023 19:12    Pertinent labs & imaging results that were available during my care of the patient were reviewed by me and considered in my medical decision making (see MDM for details).  Medications Ordered in ED Medications  losartan (COZAAR) tablet 50 mg (50 mg Oral Given 06/21/23 1821)  ALPRAZolam (XANAX) tablet 0.5 mg (0.5 mg Oral Given 06/21/23 1821)  hydrALAZINE (APRESOLINE) injection 5 mg (5 mg Intravenous Given 06/21/23 2101)  potassium chloride SA  (KLOR-CON M) CR tablet 40 mEq (40 mEq Oral Given 06/21/23 2106)  magnesium oxide (MAG-OX) tablet 400 mg (400 mg Oral Given 06/21/23 2106)  sodium chloride 0.9 % bolus 500 mL (0 mLs Intravenous Stopped 06/21/23 2216)  hydrALAZINE (APRESOLINE) injection 5 mg (5 mg Intravenous Given 06/21/23 2148)  losartan (COZAAR) tablet 50 mg (50 mg Oral Given 06/21/23 2148)  hydrALAZINE (APRESOLINE) injection 5 mg (5 mg Intravenous Given 06/21/23 2219)  Procedures Procedures  (including critical care time)  Medical Decision Making / ED Course    Medical Decision Making:    WILHO SHARPLEY is a 73 y.o. male  with past medical history as below, significant for AAA, anxiety, COPD, HLD, HTN, thyroid disease who presents to the ED with complaint of elevated blood pressure, chest pain. The complaint involves an extensive differential diagnosis and also carries with it a high risk of complications and morbidity.  Serious etiology was considered. Ddx includes but is not limited to: Differential includes all life-threatening causes for chest pain. This includes but is not exclusive to acute coronary syndrome, aortic dissection, pulmonary embolism, cardiac tamponade, community-acquired pneumonia, pericarditis, musculoskeletal chest wall pain, etc.   Complete initial physical exam performed, notably the patient was in no acute distress, blood pressure elevated.    Reviewed and confirmed nursing documentation for past medical history, family history, social history.  Vital signs reviewed.    Clinical Course as of 06/21/23 2248  Thu Jun 21, 2023  1936 TSH(!): 5.099 [SG]  1937 T4,Free(Direct): 1.00 ?Subclinical hypothyroidism  [SG]  1945 Potassium(!): 2.8 Will replace  [SG]  1945 Creatinine(!): 1.39 Baseline around 1.2 [SG]  2207 Delta trop 14, no ongoing chest pain. His BP remains  elevated, he takes multiple anti-hypertensive medications at home. Does not appear to have evidence of hypertensive emergency at this time  [SG]    Clinical Course User Index [SG] Sloan Leiter, DO    Brief summary: 73 year old male with history as above here with elevated blood pressure, intermittent chest pain.  He is currently having some chest tightness.  Breathing comfortably on ambient air.  Did have episode of exertional dyspnea few days ago after performing yard work which did resolve with rest.  Will check screening labs, give antihypertensive.  On recheck he is feeling much better, his chest pain has resolved.  Breathing comfortably.  Blood pressure has improved with antihypertensives.  Potassium is mildly depleted, given oral replacement.  Will start on replacement for home, he is on HCTZ.  Creatinine is also mildly elevated, given fluids here, encourage rehydration at home.  Poorly controlled hypertension, no evidence of hypertensive emergency at this time.  The patient's chest pain is not suggestive of pulmonary embolus, cardiac ischemia, aortic dissection, pericarditis, myocarditis, pulmonary embolism, pneumothorax, pneumonia, Zoster, or esophageal perforation, or other serious etiology.  Historically not abrupt in onset, tearing or ripping, pulses symmetric. EKG nonspecific for ischemia/infarction. No dysrhythmias, brugada, WPW, prolonged QT noted.   Troponin negative x2. CXR reviewed. Labs without demonstration of acute pathology unless otherwise noted above. Low HEART Score: 0-3 points (0.9-1.7% risk of MACE).  Given the extremely low risk of these diagnoses further testing and evaluation for these possibilities does not appear to be indicated at this time. Patient in no distress and overall condition improved here in the ED. Detailed discussions were had with the patient regarding current findings, and need for close f/u with PCP or on call doctor. The patient has been instructed  to return immediately if the symptoms worsen in any way for re-evaluation. Patient verbalized understanding and is in agreement with current care plan. All questions answered prior to discharge.             Additional history obtained: -Additional history obtained from family -External records from outside source obtained and reviewed including: Chart review including previous notes, labs, imaging, consultation notes including   Home medications, primary care documentation, prior labs   Lab Tests: -I  ordered, reviewed, and interpreted labs.   The pertinent results include:   Labs Reviewed  CBC WITH DIFFERENTIAL/PLATELET - Abnormal; Notable for the following components:      Result Value   MCHC 36.2 (*)    All other components within normal limits  COMPREHENSIVE METABOLIC PANEL - Abnormal; Notable for the following components:   Potassium 2.8 (*)    Chloride 94 (*)    Glucose, Bld 129 (*)    Creatinine, Ser 1.39 (*)    ALT 46 (*)    GFR, Estimated 53 (*)    All other components within normal limits  TSH - Abnormal; Notable for the following components:   TSH 5.099 (*)    All other components within normal limits  RESP PANEL BY RT-PCR (RSV, FLU A&B, COVID)  RVPGX2  T4, FREE  TROPONIN I (HIGH SENSITIVITY)  TROPONIN I (HIGH SENSITIVITY)    Notable for as above  EKG   EKG Interpretation Date/Time:  Thursday June 21 2023 17:51:54 EST Ventricular Rate:  55 PR Interval:  395 QRS Duration:  148 QT Interval:  494 QTC Calculation: 473 R Axis:   84  Text Interpretation: Sinus rhythm Ventricular premature complex Sinus pause Prolonged PR interval Right bundle branch block Confirmed by Tanda Rockers ([id number]) on 06/21/2023 7:43:44 PM         Imaging Studies ordered: I ordered imaging studies including cxr I independently visualized the following imaging with scope of interpretation limited to determining acute life threatening conditions related to emergency care; findings  noted above I independently visualized and interpreted imaging. I agree with the radiologist interpretation   Medicines ordered and prescription drug management: Meds ordered this encounter  Medications   losartan (COZAAR) tablet 50 mg   ALPRAZolam (XANAX) tablet 0.5 mg   hydrALAZINE (APRESOLINE) injection 5 mg   potassium chloride SA (KLOR-CON M) CR tablet 40 mEq   magnesium oxide (MAG-OX) tablet 400 mg   sodium chloride 0.9 % bolus 500 mL   hydrALAZINE (APRESOLINE) injection 5 mg   losartan (COZAAR) tablet 50 mg   hydrALAZINE (APRESOLINE) injection 5 mg   potassium chloride SA (KLOR-CON M) 20 MEQ tablet    Sig: Take 1 tablet (20 mEq total) by mouth daily.    Dispense:  3 tablet    Refill:  0    -I have reviewed the patients home medicines and have made adjustments as needed   Consultations Obtained: na   Cardiac Monitoring: The patient was maintained on a cardiac monitor.  I personally viewed and interpreted the cardiac monitored which showed an underlying rhythm of: sinus brady > NSR Continuous pulse oximetry interpreted by myself, 99% on RA.    Social Determinants of Health:  Diagnosis or treatment significantly limited by social determinants of health: former smoker   Reevaluation: After the interventions noted above, I reevaluated the patient and found that they have improved  Co morbidities that complicate the patient evaluation  Past Medical History:  Diagnosis Date   AAA (abdominal aortic aneurysm) (HCC)    3.5 cm   Anxiety    Arthritis    Cancer (HCC)    basal cell CA-under left arm   Cataract    Phreesia 06/29/2020   COPD (chronic obstructive pulmonary disease) (HCC)    Hyperlipidemia    Hypertension    Shingles    July 2014   Thyroid disease    as a child- no problem as adult   Ulcer       Dispostion:  Disposition decision including need for hospitalization was considered, and patient discharged from emergency department.    Final  Clinical Impression(s) / ED Diagnoses Final diagnoses:  Poorly-controlled hypertension  Chest pain with low risk of acute coronary syndrome  Hypokalemia  Elevated TSH        Sloan Leiter, DO 06/21/23 2248

## 2023-06-22 ENCOUNTER — Other Ambulatory Visit: Payer: Self-pay | Admitting: Urology

## 2023-06-22 DIAGNOSIS — R972 Elevated prostate specific antigen [PSA]: Secondary | ICD-10-CM

## 2023-06-28 ENCOUNTER — Telehealth: Payer: Self-pay | Admitting: Family Medicine

## 2023-06-28 ENCOUNTER — Other Ambulatory Visit: Payer: Self-pay

## 2023-06-28 MED ORDER — ATENOLOL 25 MG PO TABS: 25.0000 mg | ORAL_TABLET | Freq: Every day | ORAL | 0 refills | Status: DC

## 2023-06-28 NOTE — Telephone Encounter (Signed)
 Prescription Request  06/28/2023  LOV: 01/26/2023  What is the name of the medication or equipment?   atenolol (TENORMIN) 25 MG tablet   Have you contacted your pharmacy to request a refill? Yes   Which pharmacy would you like this sent to?  [id number] [place], [place] - [address] [place] [address] Phone: [phone] Fax: [phone]    Patient notified that their request is being sent to the clinical staff for review and that they should receive a response within 2 business days.   Please advise pharmacist.

## 2023-07-02 ENCOUNTER — Encounter: Payer: Self-pay | Admitting: Family Medicine

## 2023-07-05 ENCOUNTER — Ambulatory Visit: Admitting: Family Medicine

## 2023-07-05 ENCOUNTER — Encounter: Payer: Self-pay | Admitting: Family Medicine

## 2023-07-05 VITALS — BP 156/100 | HR 58 | Temp 97.5°F | Ht 74.0 in | Wt 218.0 lb

## 2023-07-05 DIAGNOSIS — R079 Chest pain, unspecified: Secondary | ICD-10-CM | POA: Diagnosis not present

## 2023-07-05 DIAGNOSIS — I10 Essential (primary) hypertension: Secondary | ICD-10-CM

## 2023-07-05 MED ORDER — DOXAZOSIN MESYLATE 2 MG PO TABS: 2.0000 mg | ORAL_TABLET | Freq: Every day | ORAL | 1 refills | Status: DC

## 2023-07-05 NOTE — Progress Notes (Signed)
 Subjective:    Patient ID: Keith Pratt, male    DOB: 1950/10/21, 73 y.o.   MRN: [id number]  HPI  Patient has a history of hypertension.  Recently at his physical exam LDL cholesterol was found to be in the 80s.  He has no known heart disease.  Recently he had to go to the emergency room due to extremely high blood pressure and atypical chest pain.  In the emergency room, troponins were negative x 2.  Chest x-ray was normal.  However patient's blood pressure was greater than 200 systolic.  At discharge, his blood pressure was 159 systolic.  Today his blood pressure remains elevated despite taking atenolol, losartan, and hydrochlorothiazide.  The patient a history of swelling on amlodipine and dizziness with doxazosin.  He reports intermittent chest pain.  The pain is located near the xiphoid process.  He states that some mild chest pain that comes at random times last for a few minutes and then resolves.  It seems to be unrelated to activity.  However he also reports increasing dyspnea on exertion over the last 4 weeks.  He is getting more easily winded. Past Medical History:  Diagnosis Date   AAA (abdominal aortic aneurysm) (HCC)    3.5 cm   Anxiety    Arthritis    Cancer (HCC)    basal cell CA-under left arm   Cataract    [employer] 06/29/2020   COPD (chronic obstructive pulmonary disease) (HCC)    Hyperlipidemia    Hypertension    Shingles    July 2014   Thyroid disease    as a child- no problem as adult   Ulcer    Past Surgical History:  Procedure Laterality Date   CATARACT EXTRACTION     right eye   COLONOSCOPY     EYE SURGERY N/A    [employer] 06/29/2020   JOINT REPLACEMENT N/A    [employer] 06/29/2020   right arm surgery     TONSILLECTOMY     Current Outpatient Medications on File Prior to Visit  Medication Sig Dispense Refill   albuterol (VENTOLIN HFA) 108 (90 Base) MCG/ACT inhaler Inhale 2 puffs into the lungs every 6 (six) hours as needed for wheezing or shortness  of breath. 8 g 2   ALPRAZolam (XANAX) 0.5 MG tablet Take 1 tablet (0.5 mg total) by mouth 3 (three) times daily as needed for anxiety. 30 tablet 3   aspirin 81 MG EC tablet aspirin 81 mg tablet,delayed release  TAKE 1 TABLET BY MOUTH TWICE A DAY     atenolol (TENORMIN) 25 MG tablet Take 1 tablet (25 mg total) by mouth daily. 90 tablet 0   hydrochlorothiazide (HYDRODIURIL) 25 MG tablet TAKE 1 TABLET (25 MG TOTAL) BY MOUTH DAILY. 90 tablet 3   ibuprofen (ADVIL,MOTRIN) 200 MG tablet Take 800 mg by mouth every 6 (six) hours as needed.     losartan (COZAAR) 100 MG tablet TAKE 1 TABLET BY MOUTH EVERY DAY 90 tablet 1   Magnesium Oxide (MAG-CAPS PO) Take by mouth. 250 mg 1 tablets daily     meloxicam (MOBIC) 15 MG tablet Take 1 tablet (15 mg total) by mouth daily. 90 tablet 0   omeprazole (PRILOSEC) 40 MG capsule Take 1 capsule (40 mg total) by mouth daily. 90 capsule 3   potassium chloride SA (KLOR-CON M) 20 MEQ tablet Take 1 tablet (20 mEq total) by mouth daily. 3 tablet 0   tadalafil (CIALIS) 20 MG tablet Take 0.5-1 tablets (  10-20 mg total) by mouth every other day as needed for erectile dysfunction. 10 tablet 11   No current facility-administered medications on file prior to visit.   Allergies  Allergen Reactions   Cardura [Doxazosin Mesylate] Other (See Comments)    Dizzy, hot all over - per pt "jusr felt sick"   Codeine     REACTION: nauses and vomiting   Amlodipine Swelling    Swelling in feet and ankles   Social History   Socioeconomic History   Marital status: Divorced    Spouse name: Not on file   Number of children: Not on file   Years of education: Not on file   Highest education level: Associate degree: occupational, Scientist, product/process development, or vocational program  Occupational History   Not on file  Tobacco Use   Smoking status: Former    Current packs/day: 0.00    Types: Cigarettes    Quit date: 01/08/2003    Years since quitting: 20.5   Smokeless tobacco: Never  Substance and Sexual  Activity   Alcohol use: No   Drug use: No   Sexual activity: Yes  Other Topics Concern   Not on file  Social History Narrative   Not on file   Social Drivers of Health   Financial Resource Strain: Low Risk  (08/03/2022)   Overall Financial Resource Strain (CARDIA)    Difficulty of Paying Living Expenses: Not very hard  Recent Concern: Financial Resource Strain - Medium Risk (07/20/2022)   Overall Financial Resource Strain (CARDIA)    Difficulty of Paying Living Expenses: Somewhat hard  Food Insecurity: No Food Insecurity (08/03/2022)   Hunger Vital Sign    Worried About Running Out of Food in the Last Year: Never true    Ran Out of Food in the Last Year: Never true  Transportation Needs: No Transportation Needs (08/03/2022)   PRAPARE - Administrator, Civil Service (Medical): No    Lack of Transportation (Non-Medical): No  Physical Activity: Sufficiently Active (08/03/2022)   Exercise Vital Sign    Days of Exercise per Week: 5 days    Minutes of Exercise per Session: 150+ min  Stress: Stress Concern Present (08/03/2022)   [employer] of Occupational Health - Occupational Stress Questionnaire    Feeling of Stress : To some extent  Social Connections: Moderately Integrated (08/03/2022)   Social Connection and Isolation Panel [NHANES]    Frequency of Communication with Friends and Family: Three times a week    Frequency of Social Gatherings with Friends and Family: Twice a week    Attends Religious Services: 1 to 4 times per year    Active Member of [employer] or Organizations: Yes    Attends Banker Meetings: 1 to 4 times per year    Marital Status: Divorced  Intimate Partner Violence: Not At Risk (08/03/2022)   Humiliation, Afraid, Rape, and Kick questionnaire    Fear of Current or Ex-Partner: No    Emotionally Abused: No    Physically Abused: No    Sexually Abused: No      Review of Systems  All other systems reviewed and are negative.       Objective:   Physical Exam Vitals reviewed.  Constitutional:      General: He is not in acute distress.    Appearance: Normal appearance. He is well-developed and normal weight. He is not diaphoretic.  HENT:     Head: Normocephalic and atraumatic.  Neck:     Thyroid: No  thyromegaly.     Vascular: No carotid bruit or JVD.     Trachea: No tracheal deviation.  Cardiovascular:     Rate and Rhythm: Normal rate and regular rhythm.     Pulses: Normal pulses.     Heart sounds: Normal heart sounds. No murmur heard.    No friction rub. No gallop.  Pulmonary:     Effort: Pulmonary effort is normal. No respiratory distress.     Breath sounds: Normal breath sounds. No stridor. No wheezing or rales.  Chest:     Chest wall: No tenderness.  Abdominal:     General: Bowel sounds are normal. There is no distension.     Palpations: Abdomen is soft. There is no mass.     Tenderness: There is no abdominal tenderness. There is no guarding or rebound.  Genitourinary:    Prostate: Enlarged. Not tender and no nodules present.     Rectum: External hemorrhoid present. No mass.  Musculoskeletal:     Cervical back: Neck supple. No rigidity.     Right lower leg: No edema.     Left lower leg: No edema.  Lymphadenopathy:     Cervical: No cervical adenopathy.  Skin:    General: Skin is warm.     Coloration: Skin is not pale.     Findings: No erythema or rash.  Neurological:     Mental Status: He is alert and oriented to person, place, and time.     Cranial Nerves: No cranial nerve deficit.     Motor: No abnormal muscle tone.     Coordination: Coordination normal.     Deep Tendon Reflexes: Reflexes are normal and symmetric.  Psychiatric:        Behavior: Behavior normal.        Thought Content: Thought content normal.        Judgment: Judgment normal.           Assessment & Plan:  Chest pain, unspecified type - Plan: Ambulatory referral to Cardiology  Benign essential HTN First, blood  pressure is too high.  Patient has tried and failed amlodipine in the past and does not want to try the medication again due to swelling.  He is already on maximum dose losartan and hydrochlorothiazide.  He is on atenolol.  Therefore I recommended starting doxazosin 2 mg a day.  The patient's previous reaction with doxazosin with dizziness.  I recommended that he take the medication along with losartan in the afternoon.  I recommended that he use atenolol and hydrochlorothiazide in the morning and try to spread the medication out.  Recheck blood pressure in 1 week.  Goal blood pressure is less than 140/90.  Given the patient will chest pain and worsening dyspnea on exertion, I feel he needs to see cardiology to rule out underlying coronary artery disease.  I have placed a referral to cardiology hopefully we can expedite that as quick as possible

## 2023-07-18 ENCOUNTER — Encounter: Payer: Self-pay | Admitting: Cardiology

## 2023-07-26 ENCOUNTER — Other Ambulatory Visit: Payer: Self-pay

## 2023-07-26 DIAGNOSIS — M5442 Lumbago with sciatica, left side: Secondary | ICD-10-CM

## 2023-07-26 NOTE — Telephone Encounter (Signed)
 Prescription Request  07/26/2023  LOV: 06/21/23  What is the name of the medication or equipment? meloxicam (MOBIC) 15 MG tablet [[id number]]   Have you contacted your pharmacy to request a refill? Yes   Which pharmacy would you like this sent to?  [id number] [place], [place] - [address] [place] [address] Phone: [phone] Fax: [phone]    Patient notified that their request is being sent to the clinical staff for review and that they should receive a response within 2 business days.   Please advise at [facility] [phone]

## 2023-07-27 MED ORDER — MELOXICAM 15 MG PO TABS: 15.0000 mg | ORAL_TABLET | Freq: Every day | ORAL | 1 refills | Status: DC

## 2023-07-27 NOTE — Telephone Encounter (Signed)
 Requested Prescriptions  Pending Prescriptions Disp Refills   meloxicam (MOBIC) 15 MG tablet 90 tablet 1    Sig: Take 1 tablet (15 mg total) by mouth daily.     Analgesics:  COX2 Inhibitors Failed - 07/27/2023  9:24 AM      Failed - Manual Review: Labs are only required if the patient has taken medication for more than 8 weeks.      Failed - Cr in normal range and within 360 days    Creat  Date Value Ref Range Status  01/26/2023 1.33 (H) 0.70 - 1.28 mg/dL Final   Creatinine, Ser  Date Value Ref Range Status  06/21/2023 1.39 (H) 0.61 - 1.24 mg/dL Final         Failed - ALT in normal range and within 360 days    ALT  Date Value Ref Range Status  06/21/2023 46 (H) 0 - 44 U/L Final         Passed - HGB in normal range and within 360 days    Hemoglobin  Date Value Ref Range Status  06/21/2023 14.4 13.0 - 17.0 g/dL Final         Passed - HCT in normal range and within 360 days    HCT  Date Value Ref Range Status  06/21/2023 39.8 39.0 - 52.0 % Final         Passed - AST in normal range and within 360 days    AST  Date Value Ref Range Status  06/21/2023 35 15 - 41 U/L Final         Passed - eGFR is 30 or above and within 360 days    GFR, Est African American  Date Value Ref Range Status  06/29/2020 72 > OR = 60 mL/min/1.57m2 Final   GFR, Est Non African American  Date Value Ref Range Status  06/29/2020 62 > OR = 60 mL/min/1.54m2 Final   GFR, Estimated  Date Value Ref Range Status  06/21/2023 53 (L) >60 mL/min Final    Comment:    (NOTE) Calculated using the CKD-EPI Creatinine Equation (2021)    eGFR  Date Value Ref Range Status  01/26/2023 56 (L) > OR = 60 mL/min/1.17m2 Final         Passed - Patient is not pregnant      Passed - Valid encounter within last 12 months    Recent Outpatient Visits           3 weeks ago Chest pain, unspecified type   [place] [facility] Pickard, Priscille Heidelberg, MD   1 month ago Hypertensive urgency   [facility] Park Meo, FNP   6 months ago Colon cancer screening   [place] [facility] Donita Brooks, MD   1 year ago Acute midline low back pain with bilateral sciatica   [place] [facility] Pickard, Priscille Heidelberg, MD       Future Appointments             In 2 months Tessa Lerner, DO [place] [facility], [facility]   In 6 months Pickard, Priscille Heidelberg, MD [facility], [facility]

## 2023-07-30 ENCOUNTER — Other Ambulatory Visit: Payer: Self-pay | Admitting: Family Medicine

## 2023-07-30 ENCOUNTER — Telehealth: Payer: Self-pay

## 2023-07-30 MED ORDER — ALPRAZOLAM 0.5 MG PO TABS: 0.5000 mg | ORAL_TABLET | Freq: Three times a day (TID) | ORAL | 3 refills | Status: DC | PRN

## 2023-07-30 NOTE — Telephone Encounter (Signed)
 Prescription Request  07/30/2023  LOV: 06/21/23  What is the name of the medication or equipment? ALPRAZolam (XANAX) 0.5 MG tablet [[id number]]   Have you contacted your pharmacy to request a refill? Yes   Which pharmacy would you like this sent to?  [id number] [place], [place] - [address] [place]  [address] Phone: [phone] Fax: [phone]    Patient notified that their request is being sent to the clinical staff for review and that they should receive a response within 2 business days.   Please advise at [facility] [phone]

## 2023-07-31 NOTE — Telephone Encounter (Signed)
 Requested medication (s) are due for refill today: Yes  Requested medication (s) are on the active medication list: Yes  Last refill:  06/2023  Future visit scheduled: Yes  Notes to clinic:  See pharmacy request.    Requested Prescriptions  Pending Prescriptions Disp Refills   doxazosin (CARDURA) 2 MG tablet [Pharmacy Med Name: DOXAZOSIN MESYLATE 2 MG TAB] 90 tablet 1    Sig: TAKE 1 TABLET BY MOUTH EVERY DAY     Cardiovascular:  Alpha Blockers Failed - 07/31/2023  1:05 PM      Failed - Last BP in normal range    BP Readings from Last 1 Encounters:  07/05/23 (!) 156/100         Passed - Valid encounter within last 6 months    Recent Outpatient Visits           3 weeks ago Chest pain, unspecified type   [place] [facility] Austine Lefort, MD   1 month ago Hypertensive urgency   [place] [facility] Jenelle Mis, FNP   6 months ago Colon cancer screening   [place] [facility] Austine Lefort, MD   1 year ago Acute midline low back pain with bilateral sciatica   [place] [facility] Pickard, Cisco Crest, MD       Future Appointments             In 2 months Olinda Bertrand, DO [place] [facility]   In 6 months Pickard, Cisco Crest, MD [facility], [facility]

## 2023-08-03 ENCOUNTER — Ambulatory Visit
Admission: RE | Admit: 2023-08-03 | Discharge: 2023-08-03 | Disposition: A | Discharge status: Home / Self Care | Source: Ambulatory Visit | Attending: Urology | Admitting: Urology

## 2023-08-03 DIAGNOSIS — N400 Enlarged prostate without lower urinary tract symptoms: Secondary | ICD-10-CM | POA: Diagnosis not present

## 2023-08-03 DIAGNOSIS — R972 Elevated prostate specific antigen [PSA]: Secondary | ICD-10-CM

## 2023-08-03 DIAGNOSIS — I708 Atherosclerosis of other arteries: Secondary | ICD-10-CM | POA: Diagnosis not present

## 2023-08-03 DIAGNOSIS — K5730 Diverticulosis of large intestine without perforation or abscess without bleeding: Secondary | ICD-10-CM | POA: Diagnosis not present

## 2023-08-03 MED ORDER — GADOPICLENOL 0.5 MMOL/ML IV SOLN
10.0000 mL | Freq: Once | INTRAVENOUS | Status: AC | PRN
  Administered 2023-08-03: 10 mL via INTRAVENOUS

## 2023-08-09 ENCOUNTER — Ambulatory Visit: Payer: PPO | Admitting: *Deleted

## 2023-08-09 DIAGNOSIS — Z0000 Encounter for general adult medical examination without abnormal findings: Secondary | ICD-10-CM

## 2023-08-09 NOTE — Progress Notes (Signed)
 Subjective:   Keith Pratt is a 73 y.o. male who presents for Medicare Annual/Subsequent preventive examination.  Visit Complete: Virtual I connected with  Price Broody on 08/09/23 by a audio enabled telemedicine application and verified that I am speaking with the correct person using two identifiers.  Patient Location: Home  Provider Location: Home Office  I discussed the limitations of evaluation and management by telemedicine. The patient expressed understanding and agreed to proceed.  Vital Signs: Because this visit was a virtual/telehealth visit, some criteria may be missing or patient reported. Any vitals not documented were not able to be obtained and vitals that have been documented are patient reported.   Cardiac Risk Factors include: advanced age (>8men, >29 women);male gender;hypertension     Objective:    There were no vitals filed for this visit. There is no height or weight on file to calculate BMI.     08/09/2023    9:01 AM 06/21/2023    3:51 PM 08/09/2022    3:55 PM 08/03/2022    9:04 AM 06/29/2020    9:31 AM  Advanced Directives  Does Patient Have a Medical Advance Directive? Yes No Yes Yes No  Type of Advance Directive Healthcare Power of Attorney  Living will;Healthcare Power of Attorney Living will;Healthcare Power of Attorney   Does patient want to make changes to medical advance directive?   No - Patient declined No - Patient declined   Copy of Healthcare Power of Attorney in Chart? No - copy requested  No - copy requested No - copy requested   Would patient like information on creating a medical advance directive?  No - Patient declined   No - Patient declined    Current Medications (verified) Outpatient Encounter Medications as of 08/09/2023  Medication Sig   albuterol  (VENTOLIN  HFA) 108 (90 Base) MCG/ACT inhaler Inhale 2 puffs into the lungs every 6 (six) hours as needed for wheezing or shortness of breath.   ALPRAZolam  (XANAX ) 0.5 MG  tablet Take 1 tablet (0.5 mg total) by mouth 3 (three) times daily as needed for anxiety.   atenolol  (TENORMIN ) 25 MG tablet Take 1 tablet (25 mg total) by mouth daily.   hydrochlorothiazide  (HYDRODIURIL ) 25 MG tablet TAKE 1 TABLET (25 MG TOTAL) BY MOUTH DAILY.   losartan  (COZAAR ) 100 MG tablet TAKE 1 TABLET BY MOUTH EVERY DAY   meloxicam  (MOBIC ) 15 MG tablet Take 1 tablet (15 mg total) by mouth daily.   omeprazole  (PRILOSEC) 40 MG capsule Take 1 capsule (40 mg total) by mouth daily.   potassium chloride  SA (KLOR-CON  M) 20 MEQ tablet Take 1 tablet (20 mEq total) by mouth daily.   tadalafil  (CIALIS ) 20 MG tablet Take 0.5-1 tablets (10-20 mg total) by mouth every other day as needed for erectile dysfunction.   aspirin 81 MG EC tablet aspirin 81 mg tablet,delayed release  TAKE 1 TABLET BY MOUTH TWICE A DAY   doxazosin  (CARDURA ) 2 MG tablet TAKE 1 TABLET BY MOUTH EVERY DAY (Patient not taking: Reported on 08/09/2023)   ibuprofen (ADVIL,MOTRIN) 200 MG tablet Take 800 mg by mouth every 6 (six) hours as needed.   Magnesium  Oxide (MAG-CAPS PO) Take by mouth. 250 mg 1 tablets daily   No facility-administered encounter medications on file as of 08/09/2023.    Allergies (verified) Cardura  [doxazosin  mesylate], Codeine, and Amlodipine    History: Past Medical History:  Diagnosis Date   AAA (abdominal aortic aneurysm) (HCC)    3.5 cm   Anxiety  Arthritis    Cancer (HCC)    basal cell CA-under left arm   Cataract    [facility] 06/29/2020   COPD (chronic obstructive pulmonary disease) (HCC)    Hyperlipidemia    Hypertension    Shingles    July 2014   Thyroid  disease    as a child- no problem as adult   Ulcer    Past Surgical History:  Procedure Laterality Date   CATARACT EXTRACTION     right eye   COLONOSCOPY     EYE SURGERY N/A    [facility] 06/29/2020   JOINT REPLACEMENT N/A    [facility] 06/29/2020   right arm surgery     TONSILLECTOMY     Family History  Problem Relation Age of  Onset   Colon polyps Mother    Colon cancer Neg Hx    Esophageal cancer Neg Hx    Rectal cancer Neg Hx    Stomach cancer Neg Hx    Social History   Socioeconomic History   Marital status: Divorced    Spouse name: Not on file   Number of children: Not on file   Years of education: Not on file   Highest education level: Associate degree: occupational, Scientist, product/process development, or vocational program  Occupational History   Not on file  Tobacco Use   Smoking status: Former    Current packs/day: 0.00    Types: Cigarettes    Quit date: 01/08/2003    Years since quitting: 20.5   Smokeless tobacco: Never  Substance and Sexual Activity   Alcohol use: No   Drug use: No   Sexual activity: Yes  Other Topics Concern   Not on file  Social History Narrative   Not on file   Social Drivers of Health   Financial Resource Strain: Low Risk  (08/09/2023)   Overall Financial Resource Strain (CARDIA)    Difficulty of Paying Living Expenses: Not very hard  Food Insecurity: No Food Insecurity (08/09/2023)   Hunger Vital Sign    Worried About Running Out of Food in the Last Year: Never true    Ran Out of Food in the Last Year: Never true  Transportation Needs: No Transportation Needs (08/09/2023)   PRAPARE - Administrator, Civil Service (Medical): No    Lack of Transportation (Non-Medical): No  Physical Activity: Inactive (08/09/2023)   Exercise Vital Sign    Days of Exercise per Week: 0 days    Minutes of Exercise per Session: 0 min  Stress: No Stress Concern Present (08/09/2023)   [employer] of Occupational Health - Occupational Stress Questionnaire    Feeling of Stress : Only a little  Social Connections: Moderately Isolated (08/09/2023)   Social Connection and Isolation Panel [NHANES]    Frequency of Communication with Friends and Family: More than three times a week    Frequency of Social Gatherings with Friends and Family: Twice a week    Attends Religious Services: More than 4  times per year    Active Member of [employer] or Organizations: No    Attends Engineer, structural: Never    Marital Status: Divorced    Tobacco Counseling Counseling given: Not Answered   Clinical Intake:  Pre-visit preparation completed: Yes  Pain : No/denies pain     Diabetes: No  How often do you need to have someone help you when you read instructions, pamphlets, or other written materials from your doctor or pharmacy?: 1 - Never  Interpreter Needed?: No  Information entered by :: Kieth Pelt LPN   Activities of Daily Living    08/09/2023    9:03 AM  In your present state of health, do you have any difficulty performing the following activities:  Hearing? 1  Vision? 0  Difficulty concentrating or making decisions? 0  Walking or climbing stairs? 0  Dressing or bathing? 0  Doing errands, shopping? 0  Preparing Food and eating ? N  Using the Toilet? N  In the past six months, have you accidently leaked urine? N  Do you have problems with loss of bowel control? N  Managing your Medications? N  Managing your Finances? N    Patient Care Team: Austine Lefort, MD as PCP - General (Family Medicine) Myrle Aspen, [facility] (Inactive) as Pharmacist (Pharmacist)  Indicate any recent Medical Services you may have received from other than [facility] providers in the past year (date may be approximate).     Assessment:   This is a routine wellness examination for Keith Pratt.  Hearing/Vision screen Hearing Screening - Comments:: Has trouble hearing  Does not like hearing aids as tried them does not wear them Vision Screening - Comments:: Cataract surgery  [facility]  [address]  [place]   Goals Addressed             This Visit's Progress    Patient Stated       Stay mobile        Depression Screen    08/09/2023    9:16 AM 08/03/2022    9:08 AM 07/21/2022   10:38 AM 08/26/2021    2:46 PM 08/26/2021    2:45 PM 06/29/2020    9:29 AM 05/28/2018    9:24 AM   PHQ 2/9 Scores  PHQ - 2 Score 0 0 0 0 0 2 0  PHQ- 9 Score 1   0  4     Fall Risk    08/09/2023    9:00 AM 08/03/2022    9:08 AM 08/02/2022    9:46 PM 07/21/2022   10:38 AM 08/26/2021    2:45 PM  Fall Risk   Falls in the past year? 0 0 0 0 0  Number falls in past yr: 0 0 0 0 0  Injury with Fall? 0 0 0 0 0  Risk for fall due to :  No Fall Risks  No Fall Risks No Fall Risks  Follow up Falls evaluation completed;Education provided;Falls prevention discussed Falls prevention discussed;Education provided;Falls evaluation completed  Falls prevention discussed Falls evaluation completed    MEDICARE RISK AT HOME: Medicare Risk at Home Any stairs in or around the home?: No If so, are there any without handrails?: No Home free of loose throw rugs in walkways, pet beds, electrical cords, etc?: Yes Adequate lighting in your home to reduce risk of falls?: Yes Life alert?: No Use of a cane, walker or w/c?: No Grab bars in the bathroom?: No Shower chair or bench in shower?: No Elevated toilet seat or a handicapped toilet?: No  TIMED UP AND GO:  Was the test performed?  No    Cognitive Function:        08/09/2023    9:06 AM 08/03/2022    9:07 AM  6CIT Screen  What Year? 0 points 0 points  What month? 0 points 0 points  What time? 0 points 0 points  Count back from 20 0 points 0 points  Months in reverse 0 points 0 points  Repeat phrase 4  points 0 points  Total Score 4 points 0 points    Immunizations Immunization History  Administered Date(s) Administered   Influenza, High Dose Seasonal PF 03/01/2018, 01/07/2019   PFIZER(Purple Top)SARS-COV-2 Vaccination 05/29/2019, 06/19/2019   Pneumococcal Conjugate-13 05/06/2015   Pneumococcal Polysaccharide-23 06/06/2012, 01/07/2019   Tdap 06/06/2012   Zoster, Live 06/27/2014    TDAP status: Due, Education has been provided regarding the importance of this vaccine. Advised may receive this vaccine at local pharmacy or Health Dept. Aware  to provide a copy of the vaccination record if obtained from local pharmacy or Health Dept. Verbalized acceptance and understanding.  Flu Vaccine status: Up to date  Pneumococcal vaccine status: Up to date  Covid-19 vaccine status: Information provided on how to obtain vaccines.   Qualifies for Shingles Vaccine? Yes   Zostavax completed No   Shingrix Completed?: No.    Education has been provided regarding the importance of this vaccine. Patient has been advised to call insurance company to determine out of pocket expense if they have not yet received this vaccine. Advised may also receive vaccine at local pharmacy or Health Dept. Verbalized acceptance and understanding.  Screening Tests Health Maintenance  Topic Date Due   Hepatitis C Screening  Never done   Zoster Vaccines- Shingrix (1 of 2) 05/06/1998   COVID-19 Vaccine (3 - 2024-25 season) 12/17/2022   INFLUENZA VACCINE  11/16/2023   Medicare Annual Wellness (AWV)  08/08/2024   Fecal DNA (Cologuard)  02/01/2026   Pneumonia Vaccine 36+ Years old  Completed   HPV VACCINES  Aged Out   Meningococcal B Vaccine  Aged Out   DTaP/Tdap/Td  Discontinued   Colonoscopy  Discontinued    Health Maintenance  Health Maintenance Due  Topic Date Due   Hepatitis C Screening  Never done   Zoster Vaccines- Shingrix (1 of 2) 05/06/1998   COVID-19 Vaccine (3 - 2024-25 season) 12/17/2022    Colorectal cancer screening: Type of screening: Cologuard. Completed 2024. Repeat every 3 years  Lung Cancer Screening: (Low Dose CT Chest recommended if Age 42-80 years, 20 pack-year currently smoking OR have quit w/in 15years.) does not qualify.   Lung Cancer Screening Referral:   Additional Screening:  Hepatitis C Screening  never done  Vision Screening: Recommended annual ophthalmology exams for early detection of glaucoma and other disorders of the eye. Is the patient up to date with their annual eye exam?  No  Who is the provider or what is the  name of the office in which the patient attends annual eye exams? [facility] If pt is not established with a provider, would they like to be referred to a provider to establish care? No .   Dental Screening: Recommended annual dental exams for proper oral hygiene    Community Resource Referral / Chronic Care Management: CRR required this visit?  No   CCM required this visit?  No     Plan:     I have personally reviewed and noted the following in the patient's chart:   Medical and social history Use of alcohol, tobacco or illicit drugs  Current medications and supplements including opioid prescriptions. Patient is not currently taking opioid prescriptions. Functional ability and status Nutritional status Physical activity Advanced directives List of other physicians Hospitalizations, surgeries, and ER visits in previous 12 months Vitals Screenings to include cognitive, depression, and falls Referrals and appointments  In addition, I have reviewed and discussed with patient certain preventive protocols, quality metrics, and best practice recommendations. A written  personalized care plan for preventive services as well as general preventive health recommendations were provided to patient.     Kieth Pelt, LPN   1/61/0960   After Visit Summary: (MyChart) Due to this being a telephonic visit, the after visit summary with patients personalized plan was offered to patient via MyChart   Nurse Notes:

## 2023-08-09 NOTE — Patient Instructions (Signed)
 Mr. Keith Pratt , Thank you for taking time to come for your Medicare Wellness Visit. I appreciate your ongoing commitment to your health goals. Please review the following plan we discussed and let me know if I can assist you in the future.   Screening recommendations/referrals: Colonoscopy: no longer required Recommended yearly ophthalmology/optometry visit for glaucoma screening and checkup Recommended yearly dental visit for hygiene and checkup  Vaccinations: Influenza vaccine: up to date Pneumococcal vaccine: up to date Tdap vaccine: Education provided Shingles vaccine: Education provided      Preventive Care 65 Years and Older, Male Preventive care refers to lifestyle choices and visits with your health care provider that can promote health and wellness. What does preventive care include? A yearly physical exam. This is also called an annual well check. Dental exams once or twice a year. Routine eye exams. Ask your health care provider how often you should have your eyes checked. Personal lifestyle choices, including: Daily care of your teeth and gums. Regular physical activity. Eating a healthy diet. Avoiding tobacco and drug use. Limiting alcohol use. Practicing safe sex. Taking low doses of aspirin every day. Taking vitamin and mineral supplements as recommended by your health care provider. What happens during an annual well check? The services and screenings done by your health care provider during your annual well check will depend on your age, overall health, lifestyle risk factors, and family history of disease. Counseling  Your health care provider may ask you questions about your: Alcohol use. Tobacco use. Drug use. Emotional well-being. Home and relationship well-being. Sexual activity. Eating habits. History of falls. Memory and ability to understand (cognition). Work and work Astronomer. Screening  You may have the following tests or  measurements: Height, weight, and BMI. Blood pressure. Lipid and cholesterol levels. These may be checked every 5 years, or more frequently if you are over 57 years old. Skin check. Lung cancer screening. You may have this screening every year starting at age 5 if you have a 30-pack-year history of smoking and currently smoke or have quit within the past 15 years. Fecal occult blood test (FOBT) of the stool. You may have this test every year starting at age 4. Flexible sigmoidoscopy or colonoscopy. You may have a sigmoidoscopy every 5 years or a colonoscopy every 10 years starting at age 76. Prostate cancer screening. Recommendations will vary depending on your family history and other risks. Hepatitis C blood test. Hepatitis B blood test. Sexually transmitted disease (STD) testing. Diabetes screening. This is done by checking your blood sugar (glucose) after you have not eaten for a while (fasting). You may have this done every 1-3 years. Abdominal aortic aneurysm (AAA) screening. You may need this if you are a current or former smoker. Osteoporosis. You may be screened starting at age 21 if you are at high risk. Talk with your health care provider about your test results, treatment options, and if necessary, the need for more tests. Vaccines  Your health care provider may recommend certain vaccines, such as: Influenza vaccine. This is recommended every year. Tetanus, diphtheria, and acellular pertussis (Tdap, Td) vaccine. You may need a Td booster every 10 years. Zoster vaccine. You may need this after age 65. Pneumococcal 13-valent conjugate (PCV13) vaccine. One dose is recommended after age 14. Pneumococcal polysaccharide (PPSV23) vaccine. One dose is recommended after age 41. Talk to your health care provider about which screenings and vaccines you need and how often you need them. This information is not intended to replace  advice given to you by your health care provider. Make sure  you discuss any questions you have with your health care provider. Document Released: 04/30/2015 Document Revised: 12/22/2015 Document Reviewed: 02/02/2015 Elsevier Interactive Patient Education  2017 [employer].  Fall Prevention in the Home Falls can cause injuries. They can happen to people of all ages. There are many things you can do to make your home safe and to help prevent falls. What can I do on the outside of my home? Regularly fix the edges of walkways and driveways and fix any cracks. Remove anything that might make you trip as you walk through a door, such as a raised step or threshold. Trim any bushes or trees on the path to your home. Use bright outdoor lighting. Clear any walking paths of anything that might make someone trip, such as rocks or tools. Regularly check to see if handrails are loose or broken. Make sure that both sides of any steps have handrails. Any raised decks and porches should have guardrails on the edges. Have any leaves, snow, or ice cleared regularly. Use sand or salt on walking paths during winter. Clean up any spills in your garage right away. This includes oil or grease spills. What can I do in the bathroom? Use night lights. Install grab bars by the toilet and in the tub and shower. Do not use towel bars as grab bars. Use non-skid mats or decals in the tub or shower. If you need to sit down in the shower, use a plastic, non-slip stool. Keep the floor dry. Clean up any water that spills on the floor as soon as it happens. Remove soap buildup in the tub or shower regularly. Attach bath mats securely with double-sided non-slip rug tape. Do not have throw rugs and other things on the floor that can make you trip. What can I do in the bedroom? Use night lights. Make sure that you have a light by your bed that is easy to reach. Do not use any sheets or blankets that are too big for your bed. They should not hang down onto the floor. Have a firm  chair that has side arms. You can use this for support while you get dressed. Do not have throw rugs and other things on the floor that can make you trip. What can I do in the kitchen? Clean up any spills right away. Avoid walking on wet floors. Keep items that you use a lot in easy-to-reach places. If you need to reach something above you, use a strong step stool that has a grab bar. Keep electrical cords out of the way. Do not use floor polish or wax that makes floors slippery. If you must use wax, use non-skid floor wax. Do not have throw rugs and other things on the floor that can make you trip. What can I do with my stairs? Do not leave any items on the stairs. Make sure that there are handrails on both sides of the stairs and use them. Fix handrails that are broken or loose. Make sure that handrails are as long as the stairways. Check any carpeting to make sure that it is firmly attached to the stairs. Fix any carpet that is loose or worn. Avoid having throw rugs at the top or bottom of the stairs. If you do have throw rugs, attach them to the floor with carpet tape. Make sure that you have a light switch at the top of the stairs and the bottom  of the stairs. If you do not have them, ask someone to add them for you. What else can I do to help prevent falls? Wear shoes that: Do not have high heels. Have rubber bottoms. Are comfortable and fit you well. Are closed at the toe. Do not wear sandals. If you use a stepladder: Make sure that it is fully opened. Do not climb a closed stepladder. Make sure that both sides of the stepladder are locked into place. Ask someone to hold it for you, if possible. Clearly mark and make sure that you can see: Any grab bars or handrails. First and last steps. Where the edge of each step is. Use tools that help you move around (mobility aids) if they are needed. These include: Canes. Walkers. Scooters. Crutches. Turn on the lights when you go  into a dark area. Replace any light bulbs as soon as they burn out. Set up your furniture so you have a clear path. Avoid moving your furniture around. If any of your floors are uneven, fix them. If there are any pets around you, be aware of where they are. Review your medicines with your doctor. Some medicines can make you feel dizzy. This can increase your chance of falling. Ask your doctor what other things that you can do to help prevent falls. This information is not intended to replace advice given to you by your health care provider. Make sure you discuss any questions you have with your health care provider. Document Released: 01/28/2009 Document Revised: 09/09/2015 Document Reviewed: 05/08/2014 Elsevier Interactive Patient Education  2017 [employer].

## 2023-08-16 ENCOUNTER — Encounter: Payer: Self-pay | Admitting: Family Medicine

## 2023-08-16 ENCOUNTER — Ambulatory Visit: Admitting: Family Medicine

## 2023-08-16 VITALS — BP 190/80 | HR 60 | Temp 97.5°F | Ht 74.0 in | Wt 217.0 lb

## 2023-08-16 DIAGNOSIS — I10 Essential (primary) hypertension: Secondary | ICD-10-CM

## 2023-08-16 DIAGNOSIS — N402 Nodular prostate without lower urinary tract symptoms: Secondary | ICD-10-CM

## 2023-08-16 MED ORDER — AMLODIPINE BESYLATE 5 MG PO TABS
5.0000 mg | ORAL_TABLET | Freq: Every day | ORAL | 3 refills | Status: DC
Start: 2023-08-16 — End: 2024-01-29

## 2023-08-16 NOTE — Progress Notes (Addendum)
 Subjective:    Patient ID: Keith Pratt, male    DOB: 1950/08/01, 73 y.o.   MRN: [id number]    Patient is here today to discuss his prostate MRI.  Patient has an appointment to see his cardiologist in July for his chest pain and shortness of breath.  However he recently had a prostate MRI due to an elevated PSA.  Prostate MRI showed 2 lesions both PI-RADS category 3.  He has not yet spoken to his urologist.  He is very concerned about mounting medical bills.  I checked his blood pressure after discussing his MRI and his systolic blood pressure rose to 190 although I believe anxiety is contributing to this.  Unfortunately he is not checking his blood pressure at home.  He is currently on atenolol , losartan , and hydrochlorothiazide .  He stopped doxazosin  because the medication made him "feel anxious".  He stopped amlodipine  due to leg swelling. Past Medical History:  Diagnosis Date   AAA (abdominal aortic aneurysm) (HCC)    3.5 cm   Anxiety    Arthritis    Cancer (HCC)    basal cell CA-under left arm   Cataract    [employer] 06/29/2020   COPD (chronic obstructive pulmonary disease) (HCC)    Hyperlipidemia    Hypertension    Shingles    July 2014   Thyroid  disease    as a child- no problem as adult   Ulcer    Past Surgical History:  Procedure Laterality Date   CATARACT EXTRACTION     right eye   COLONOSCOPY     EYE SURGERY N/A    [facility] 06/29/2020   JOINT REPLACEMENT N/A    [facility] 06/29/2020   right arm surgery     TONSILLECTOMY     Current Outpatient Medications on File Prior to Visit  Medication Sig Dispense Refill   ALPRAZolam  (XANAX ) 0.5 MG tablet Take 1 tablet (0.5 mg total) by mouth 3 (three) times daily as needed for anxiety. 30 tablet 3   aspirin 81 MG EC tablet aspirin 81 mg tablet,delayed release  TAKE 1 TABLET BY MOUTH TWICE A DAY     atenolol  (TENORMIN ) 25 MG tablet Take 1 tablet (25 mg total) by mouth daily. 90 tablet 0   hydrochlorothiazide   (HYDRODIURIL ) 25 MG tablet TAKE 1 TABLET (25 MG TOTAL) BY MOUTH DAILY. 90 tablet 3   losartan  (COZAAR ) 100 MG tablet TAKE 1 TABLET BY MOUTH EVERY DAY 90 tablet 1   meloxicam  (MOBIC ) 15 MG tablet Take 1 tablet (15 mg total) by mouth daily. 90 tablet 1   omeprazole  (PRILOSEC) 40 MG capsule Take 1 capsule (40 mg total) by mouth daily. 90 capsule 3   potassium chloride  SA (KLOR-CON  M) 20 MEQ tablet Take 1 tablet (20 mEq total) by mouth daily. 3 tablet 0   tadalafil  (CIALIS ) 20 MG tablet Take 0.5-1 tablets (10-20 mg total) by mouth every other day as needed for erectile dysfunction. 10 tablet 11   albuterol  (VENTOLIN  HFA) 108 (90 Base) MCG/ACT inhaler Inhale 2 puffs into the lungs every 6 (six) hours as needed for wheezing or shortness of breath. (Patient not taking: Reported on 08/16/2023) 8 g 2   No current facility-administered medications on file prior to visit.   Allergies  Allergen Reactions   Cardura  [Doxazosin  Mesylate] Other (See Comments)    Dizzy, hot all over - per pt "jusr felt sick"   Codeine     REACTION: nauses and vomiting   Amlodipine  Swelling  Swelling in feet and ankles   Social History   Socioeconomic History   Marital status: Divorced    Spouse name: Not on file   Number of children: Not on file   Years of education: Not on file   Highest education level: Some college, no degree  Occupational History   Not on file  Tobacco Use   Smoking status: Former    Current packs/day: 0.00    Types: Cigarettes    Quit date: 01/08/2003    Years since quitting: 20.6   Smokeless tobacco: Never  Substance and Sexual Activity   Alcohol use: No   Drug use: No   Sexual activity: Yes  Other Topics Concern   Not on file  Social History Narrative   Not on file   Social Drivers of Health   Financial Resource Strain: Low Risk  (08/13/2023)   Overall Financial Resource Strain (CARDIA)    Difficulty of Paying Living Expenses: Not very hard  Food Insecurity: No Food Insecurity  (08/13/2023)   Hunger Vital Sign    Worried About Running Out of Food in the Last Year: Never true    Ran Out of Food in the Last Year: Never true  Transportation Needs: No Transportation Needs (08/13/2023)   PRAPARE - Administrator, Civil Service (Medical): No    Lack of Transportation (Non-Medical): No  Physical Activity: Sufficiently Active (08/13/2023)   Exercise Vital Sign    Days of Exercise per Week: 5 days    Minutes of Exercise per Session: 30 min  Recent Concern: Physical Activity - Inactive (08/09/2023)   Exercise Vital Sign    Days of Exercise per Week: 0 days    Minutes of Exercise per Session: 0 min  Stress: Stress Concern Present (08/13/2023)   [employer] of Occupational Health - Occupational Stress Questionnaire    Feeling of Stress : To some extent  Social Connections: Socially Isolated (08/13/2023)   Social Connection and Isolation Panel [NHANES]    Frequency of Communication with Friends and Family: Twice a week    Frequency of Social Gatherings with Friends and Family: Twice a week    Attends Religious Services: Never    Database administrator or Organizations: No    Attends Banker Meetings: Never    Marital Status: Divorced  Catering manager Violence: Not At Risk (08/09/2023)   Humiliation, Afraid, Rape, and Kick questionnaire    Fear of Current or Ex-Partner: No    Emotionally Abused: No    Physically Abused: No    Sexually Abused: No      Review of Systems  All other systems reviewed and are negative.      Objective:   Physical Exam Vitals reviewed.  Constitutional:      General: He is not in acute distress.    Appearance: Normal appearance. He is well-developed and normal weight. He is not diaphoretic.  HENT:     Head: Normocephalic and atraumatic.  Neck:     Thyroid : No thyromegaly.     Vascular: No carotid bruit or JVD.     Trachea: No tracheal deviation.  Cardiovascular:     Rate and Rhythm: Normal rate and  regular rhythm.     Pulses: Normal pulses.     Heart sounds: Normal heart sounds. No murmur heard.    No friction rub. No gallop.  Pulmonary:     Effort: Pulmonary effort is normal. No respiratory distress.     Breath sounds:  Normal breath sounds. No stridor. No wheezing or rales.  Chest:     Chest wall: No tenderness.  Abdominal:     General: Bowel sounds are normal. There is no distension.     Palpations: Abdomen is soft. There is no mass.     Tenderness: There is no abdominal tenderness. There is no guarding or rebound.  Musculoskeletal:     Cervical back: Neck supple. No rigidity.     Right lower leg: No edema.     Left lower leg: No edema.  Lymphadenopathy:     Cervical: No cervical adenopathy.  Skin:    General: Skin is warm.     Coloration: Skin is not pale.     Findings: No erythema or rash.  Neurological:     Mental Status: He is alert and oriented to person, place, and time.     Cranial Nerves: No cranial nerve deficit.     Motor: No abnormal muscle tone.     Coordination: Coordination normal.     Deep Tendon Reflexes: Reflexes are normal and symmetric.  Psychiatric:        Behavior: Behavior normal.        Thought Content: Thought content normal.        Judgment: Judgment normal.           Assessment & Plan:  Benign essential HTN  Prostate nodule Patient's blood pressure remains elevated significantly.  Some of this is due to anxiety and excessive worry over the results of his prostate MRI.  However he needs to resume amlodipine  5 mg a day and recheck blood pressure in 1 week.  Patient would like to wait till after he sees the cardiologist to decide on whether he needs a prostate biopsy.  I explained that the MRI is trying to direct the biopsy.  I could only find 1 study discussing the risk of prostate cancer based on the appearance of the lesion.  There was a study of over 215 men average age 68 with PI-RADS category 3 lesions and an average PSA of 6.  92  biopsy were performed.  93% were negative and showed BPH.  7% showed prostate cancer.  I discussed this data with the patient to try to assuage some of his anxiety. NIH states:  Currently available data show that the actual prevalence of csPCa after targeted biopsy in PI-RADS 3 lesions vary between patients groups from one in five (21%) to one in six (16%), depending on previous biopsy status.   However I recommended that he discuss this with his urologist and get his expert opinion. recheck blood pressure in 1 week

## 2023-09-11 DIAGNOSIS — H43813 Vitreous degeneration, bilateral: Secondary | ICD-10-CM | POA: Diagnosis not present

## 2023-09-11 DIAGNOSIS — H35033 Hypertensive retinopathy, bilateral: Secondary | ICD-10-CM | POA: Diagnosis not present

## 2023-09-11 DIAGNOSIS — H20012 Primary iridocyclitis, left eye: Secondary | ICD-10-CM | POA: Diagnosis not present

## 2023-09-20 ENCOUNTER — Telehealth: Payer: Self-pay | Admitting: Family Medicine

## 2023-09-20 ENCOUNTER — Other Ambulatory Visit: Payer: Self-pay

## 2023-09-20 MED ORDER — HYDROCHLOROTHIAZIDE 25 MG PO TABS: 25.0000 mg | ORAL_TABLET | Freq: Every day | ORAL | 3 refills | Status: AC

## 2023-09-20 NOTE — Telephone Encounter (Signed)
 Prescription Request  09/20/2023  LOV: 08/16/2023  What is the name of the medication or equipment? hydrochlorothiazide  (HYDRODIURIL ) 25 MG tablet   Have you contacted your pharmacy to request a refill? Yes   Which pharmacy would you like this sent to?  [id number] Jonette Nestle, [place] - [address] [place] [address] Phone: [phone] Fax: [phone]    Patient notified that their request is being sent to the clinical staff for review and that they should receive a response within 2 business days.   Please advise at [facility] [phone]

## 2023-09-20 NOTE — Telephone Encounter (Signed)
 Medication refill sent to pharmacy on file at current dosage.

## 2023-10-24 ENCOUNTER — Encounter: Payer: Self-pay | Admitting: Cardiology

## 2023-10-24 ENCOUNTER — Ambulatory Visit: Attending: Cardiology | Admitting: Cardiology

## 2023-10-24 VITALS — BP 174/69 | HR 68 | Resp 16 | Ht 74.0 in | Wt 217.2 lb

## 2023-10-24 DIAGNOSIS — I714 Abdominal aortic aneurysm, without rupture, unspecified: Secondary | ICD-10-CM | POA: Diagnosis not present

## 2023-10-24 DIAGNOSIS — R072 Precordial pain: Secondary | ICD-10-CM

## 2023-10-24 DIAGNOSIS — E782 Mixed hyperlipidemia: Secondary | ICD-10-CM

## 2023-10-24 DIAGNOSIS — I10 Essential (primary) hypertension: Secondary | ICD-10-CM | POA: Diagnosis not present

## 2023-10-24 DIAGNOSIS — Z87891 Personal history of nicotine dependence: Secondary | ICD-10-CM | POA: Diagnosis not present

## 2023-10-24 DIAGNOSIS — I459 Conduction disorder, unspecified: Secondary | ICD-10-CM

## 2023-10-24 MED ORDER — HYDRALAZINE HCL 10 MG PO TABS: 10.0000 mg | ORAL_TABLET | Freq: Two times a day (BID) | ORAL | 3 refills | Status: DC

## 2023-10-24 NOTE — Patient Instructions (Addendum)
 Medication Instructions:  DISCONTINUE Atenolol    STARTING hydralazine  10 mg twice a day, HOLD if systolic blood pressure (top number) is less than 140.    *If you need a refill on your cardiac medications before your next appointment, please call your pharmacy*  Lab Work: CMP, TSH and Magnesium  done in 1 week.    If you have labs (blood work) drawn today and your tests are completely normal, you will receive your results only by: MyChart Message (if you have MyChart) OR A paper copy in the mail If you have any lab test that is abnormal or we need to change your treatment, we will call you to review the results.  Testing/Procedures: Your physician has requested that you have an echocardiogram. Echocardiography is a painless test that uses sound waves to create images of your heart. It provides your doctor with information about the size and shape of your heart and how well your heart's chambers and valves are working. This procedure takes approximately one hour. There are no restrictions for this procedure. Please do NOT wear cologne, perfume, aftershave, or lotions (deodorant is allowed). Please arrive 15 minutes prior to your appointment time.  Please note: We ask at that you not bring children with you during ultrasound (echo/ vascular) testing. Due to room size and safety concerns, children are not allowed in the ultrasound rooms during exams. Our front office staff cannot provide observation of children in our lobby area while testing is being conducted. An adult accompanying a patient to their appointment will only be allowed in the ultrasound room at the discretion of the ultrasound technician under special circumstances. We apologize for any inconvenience.   Your provider also would like to schedule you for an ultrasound of the Aorta.   Follow-Up: At [facility], you and your health needs are our priority.  As part of our continuing mission to provide you with exceptional  heart care, our providers are all part of one team.  This team includes your primary Cardiologist (physician) and Advanced Practice Providers or APPs (Physician Assistants and Nurse Practitioners) who all work together to provide you with the care you need, when you need it.  Your next appointment:   6 weeks   Provider:   Madonna Large, DO    We recommend signing up for the patient portal called MyChart.  Sign up information is provided on this After Visit Summary.  MyChart is used to connect with patients for Virtual Visits (Telemedicine).  Patients are able to view lab/test results, encounter notes, upcoming appointments, etc.  Non-urgent messages can be sent to your provider as well.   To learn more about what you can do with MyChart, go to [email or web address].

## 2023-10-24 NOTE — Progress Notes (Signed)
 Cardiology Office Note:    Date:  10/24/2023  NAME:  Keith Pratt    MRN: [id number] DOB:  04/02/1951   PCP:  Duanne Butler DASEN, MD  Former Cardiology Providers: None Primary Cardiologist:  Madonna Large, DO, [facility] (established care 10/24/2023) Electrophysiologist:  None   Referring MD: Duanne Butler DASEN, MD  Reason of Consult: Chest pain  Chief Complaint  Patient presents with   Chest Pain   New Patient (Initial Visit)    History of Present Illness:    Keith Pratt is a 73 y.o. Caucasian male whose past medical history and cardiovascular risk factors includes: Hypertension, hyperlipidemia, AAA. He is being seen today for the evaluation of chest pain at the request of Duanne Butler DASEN, MD.  Patient was referred to the practice for evaluation of chest pain, initial consult was placed in March or 2025.  Patient states that he has random discomfort in the left upper shoulder girdle.  He denies precordial pain.  No symptoms with effort related activities.  No symptoms postprandial.  Patient states that he has been working with his PCP for better blood pressure management but has been difficult over the last several months.  He has been experiencing very frequently lightheadedness, dizziness, shortness of breath with very minimal activity, and near syncope.  He denies frank syncopal events.   Current Medications: Current Meds  Medication Sig   albuterol  (VENTOLIN  HFA) 108 (90 Base) MCG/ACT inhaler Inhale 2 puffs into the lungs every 6 (six) hours as needed for wheezing or shortness of breath.   ALPRAZolam  (XANAX ) 0.5 MG tablet Take 1 tablet (0.5 mg total) by mouth 3 (three) times daily as needed for anxiety.   amLODipine  (NORVASC ) 5 MG tablet Take 1 tablet (5 mg total) by mouth daily.   aspirin 81 MG EC tablet aspirin 81 mg tablet,delayed release  TAKE 1 TABLET BY MOUTH TWICE A DAY   hydrALAZINE  (APRESOLINE ) 10 MG tablet Take 1 tablet (10 mg total) by mouth in the morning  and at bedtime. HOLD if systolic blood pressure (top number) is less than 140.   hydrochlorothiazide  (HYDRODIURIL ) 25 MG tablet Take 1 tablet (25 mg total) by mouth daily.   losartan  (COZAAR ) 100 MG tablet TAKE 1 TABLET BY MOUTH EVERY DAY   meloxicam  (MOBIC ) 15 MG tablet Take 1 tablet (15 mg total) by mouth daily.   potassium chloride  SA (KLOR-CON  M) 20 MEQ tablet Take 1 tablet (20 mEq total) by mouth daily.   tadalafil  (CIALIS ) 20 MG tablet Take 0.5-1 tablets (10-20 mg total) by mouth every other day as needed for erectile dysfunction.   [DISCONTINUED] atenolol  (TENORMIN ) 25 MG tablet Take 1 tablet (25 mg total) by mouth daily.     Allergies:    Cardura  [doxazosin  mesylate], Codeine, and Amlodipine    Past Medical History: Past Medical History:  Diagnosis Date   AAA (abdominal aortic aneurysm) (HCC)    3.5 cm   Anxiety    Arthritis    Cancer (HCC)    basal cell CA-under left arm   Cataract    [employer]reesia 06/29/2020   COPD (chronic obstructive pulmonary disease) (HCC)    Hyperlipidemia    Hypertension    Shingles    July 2014   Thyroid  disease    as a child- no problem as adult   Ulcer     Past Surgical History: Past Surgical History:  Procedure Laterality Date   CATARACT EXTRACTION     right eye   COLONOSCOPY  EYE SURGERY N/A    [employer]reesia 06/29/2020   JOINT REPLACEMENT N/A    [employer]reesia 06/29/2020   right arm surgery     TONSILLECTOMY      Social History: Social History   Tobacco Use   Smoking status: Former    Current packs/day: 0.00    Types: Cigarettes    Quit date: 01/08/2003    Years since quitting: 20.8   Smokeless tobacco: Never  Substance Use Topics   Alcohol use: No   Drug use: No    Family History: Family History  Problem Relation Age of Onset   Colon polyps Mother    Colon cancer Neg Hx    Esophageal cancer Neg Hx    Rectal cancer Neg Hx    Stomach cancer Neg Hx     ROS:   Review of Systems  Cardiovascular:  Positive for near-syncope.  Negative for chest pain, claudication, irregular heartbeat, leg swelling, orthopnea, palpitations, paroxysmal nocturnal dyspnea and syncope.  Respiratory:  Positive for shortness of breath.   Hematologic/Lymphatic: Negative for bleeding problem.  Neurological:  Positive for dizziness and light-headedness.    EKGs/Labs/Other Studies Reviewed:   EKG: EKG Interpretation Date/Time:  Wednesday October 24 2023 16:31:04 EDT Ventricular Rate:  50 PR Interval:    QRS Duration:  142 QT Interval:  508 QTC Calculation: 463 R Axis:   42  Text Interpretation: Sinus bradycardia with secondary type I AV block versus high degree AV block V entricular escape beats W ithout underlying injury pattern. Right bundle branch block When compared with ECG of 24-Oct-2023 15:39, Conduction disease is still present Premature ventricular complexes are now Present Confirmed by Michele Richardson [phone]) on 10/24/2023 5:30:22 PM  Echocardiogram: 06/05/2018  1. The left ventricle has normal systolic function, with an ejection  fraction of 55-60%. The cavity size was normal. There is mildly increased  left ventricular wall thickness. Left ventricular diastolic Doppler  parameters are consistent with impaired  relaxation No evidence of left ventricular regional wall motion  abnormalities.   2. The right ventricle has normal systolic function. The cavity was  normal. There is no increase in right ventricular wall thickness.   3. The mitral valve is normal in structure. Mild calcification of the  mitral valve leaflet. There is mild mitral annular calcification present.  No evidence of mitral valve stenosis. No regurgitation.   4. The tricuspid valve is normal in structure.   5. The aortic valve is tricuspid Mild calcification of the aortic valve.  no stenosis of the aortic valve.   6. The aortic root and ascending aorta are normal in size and structure.   7. The IVC was not visualized. Peak RV-RA gradient 21 mmHg.   Stress  Testing:  GXT 05/2016: The patient walked for 3:57 of a standard Bruce protocol GXT. Peak HR was 129 which is 84% predicted maximal HR. There were no ST changes at maximal exercise ( which was submaximal ) . He had PVCs during the GXT The BP response was normal Nondiagnositic GXT due to inability to achieve target HR . There were no ST or T wave changes at his maximal effort.  US  Aorta Medicare Screen 06/02/2016 Suprarenal fusiform AAA in the proximal aorta measuring 3. 5 x 3. 5 cm. Normal caliber, upper limit, right common iliac artery. Normal caliber left common and bilateral external iliac arteries. Aorto- iliac atherosclerosis. > 50% common iliac arteries, bilaterally. Patent IVC.  F/ U 1 year.  Labs:    Latest Ref Rng &  Units 06/21/2023    6:01 PM 01/26/2023   10:58 AM 07/21/2022   10:58 AM  CBC  WBC 4.0 - 10.5 K/uL 8.6  8.0  7.2   Hemoglobin 13.0 - 17.0 g/dL 85.5  84.8  85.1   Hematocrit 39.0 - 52.0 % 39.8  43.4  42.7   Platelets 150 - 400 K/uL 214  248  235        Latest Ref Rng & Units 06/21/2023    6:01 PM 01/26/2023   10:58 AM 07/21/2022   10:58 AM  BMP  Glucose 70 - 99 mg/dL 870  72  895   BUN 8 - 23 mg/dL 17  18  17    Creatinine 0.61 - 1.24 mg/dL 8.60  8.66  8.81   BUN/Creat Ratio 6 - 22 (calc)  14  SEE NOTE:   Sodium 135 - 145 mmol/L 137  139  139   Potassium 3.5 - 5.1 mmol/L 2.8  3.6  3.3   Chloride 98 - 111 mmol/L 94  99  97   CO2 22 - 32 mmol/L 30  29  30    Calcium 8.9 - 10.3 mg/dL 9.3  9.5  9.3       Latest Ref Rng & Units 06/21/2023    6:01 PM 01/26/2023   10:58 AM 07/21/2022   10:58 AM  CMP  Glucose 70 - 99 mg/dL 870  72  895   BUN 8 - 23 mg/dL 17  18  17    Creatinine 0.61 - 1.24 mg/dL 8.60  8.66  8.81   Sodium 135 - 145 mmol/L 137  139  139   Potassium 3.5 - 5.1 mmol/L 2.8  3.6  3.3   Chloride 98 - 111 mmol/L 94  99  97   CO2 22 - 32 mmol/L 30  29  30    Calcium 8.9 - 10.3 mg/dL 9.3  9.5  9.3   Total Protein 6.5 - 8.1 g/dL 7.3  7.3  7.0   Total  Bilirubin 0.0 - 1.2 mg/dL 1.1  0.6  0.5   Alkaline Phos 38 - 126 U/L 48     AST 15 - 41 U/L 35  22  19   ALT 0 - 44 U/L 46  26  22     Lab Results  Component Value Date   CHOL 157 01/26/2023   HDL 32 (L) 01/26/2023   LDLCALC 86 01/26/2023   TRIG 325 (H) 01/26/2023   CHOLHDL 4.9 01/26/2023   No results for input(s): LIPOA in the last 8760 hours. No components found for: NTPROBNP No results for input(s): PROBNP in the last 8760 hours. Recent Labs    06/21/23 1801  TSH 5.099*    Physical Exam:    Today's Vitals   10/24/23 1532  BP: (!) 174/69  Pulse: 68  Resp: 16  SpO2: 94%  Weight: 217 lb 3.2 oz (98.5 kg)  Height: 6' 2 (1.88 m)   Body mass index is 27.89 kg/m. Wt Readings from Last 3 Encounters:  10/24/23 217 lb 3.2 oz (98.5 kg)  08/16/23 217 lb (98.4 kg)  07/05/23 218 lb (98.9 kg)    Physical Exam  Constitutional: No distress. He appears chronically ill.  hemodynamically stable  Neck: No JVD present.  Cardiovascular: S1 normal and S2 normal. An irregularly irregular rhythm present. Bradycardia present. Exam reveals no gallop, no S3 and no S4.  No murmur heard. Pulses:      Radial pulses are 2+ on the right  side and 2+ on the left side.       Dorsalis pedis pulses are 2+ on the right side and 2+ on the left side.       Posterior tibial pulses are 2+ on the right side and 2+ on the left side.  Pulmonary/Chest: Effort normal and breath sounds normal. No stridor. He has no wheezes. He has no rales.  Musculoskeletal:        General: No edema.     Cervical back: Neck supple.  Skin: Skin is warm.   Impression & Recommendation(s):  Impression:   ICD-10-CM   1. Precordial pain  R07.2 EKG 12-Lead    EKG 12-Lead    ECHOCARDIOGRAM COMPLETE    2. Conduction disorder of the heart  I45.9 EKG 12-Lead    EKG 12-Lead    ECHOCARDIOGRAM COMPLETE    TSH    Magnesium     Magnesium     TSH    3. Benign hypertension  I10 hydrALAZINE  (APRESOLINE ) 10 MG tablet     Comp Met (CMET)    TSH    Magnesium     Magnesium     TSH    Comp Met (CMET)    4. Abdominal aortic aneurysm (AAA) without rupture, unspecified part (HCC)  I71.40 VAS US  AORTA/IVC/ILIACS    5. Mixed hyperlipidemia  E78.2 Comp Met (CMET)    6. Former smoker  Z87.891        Recommendation(s):  Precordial pain Symptoms are noncardiac. EKG is nonischemic. Has multiple cardiovascular risk factors; however, ischemic workup at this time is challenging given his underlying conduction disease.  He had a GXT in the past which was nondiagnostic due to the inability to achieve 85% of age-predicted maximum heart rate.  Pharmacological stress not ideal in the setting of underlying conduction disease.  Given the variable heart RR intervals coronary CTA is less than ideal at this time as well. Echo will be ordered to evaluate for structural heart disease and left ventricular systolic function. Reviewed the symptoms of typical chest pain and if present patient is asked to the closest ER via EMS for further evaluation and management.  Conduction disorder of the heart RBBB Secondary Type I AV / Type II / vs. High Degree AV Block  Symptomatic Has had conduction disease going back to March 2025 when his symptoms began. He has been experiencing lightheadedness, dizziness, near-syncope, shortness of breath with effort related activities. He has underlying right bundle branch block. Today's EKG shows sinus bradycardia with second-degree type I AV block versus concerns for high degree AV block. Hemodynamically he is stable and the symptoms are not acute therefore favoring outpatient workup However, patient is advised to go to the closest ER via EMS if his symptoms worsen or has frank syncope. He is on a low-dose atenolol  25 mg p.o. daily, last dose earlier today. Recommend discontinuation of atenolol . Check CMP, TSH, magnesium  for reversible causes Ideally would like to arrange EP appointment in the next 24  to 48 hours after getting appropriate time for atenolol  washout  Arranged an outpatient EP appointment with Dr. Kennyth tomorrow 10/25/2023 to evaluate for pacemaker candidacy versus either cardiac monitor/ILR.  Benign hypertension Patient states that home blood pressures on current medical therapy has been as high as 200 mmHg. Discontinue atenolol  as discussed above We will start hydralazine  10 mg p.o. twice daily, hold if SBP <140 mmHg (may become more symptomatic if he achieves normotensive or hypotension given his underlying conduction disease) Amlodipine  discontinued in the past  due to lower extremity swelling. Doxazosin  stopped due to dizziness  Abdominal aortic aneurysm (AAA) without rupture, unspecified part [facility]) Had a AAA screening back in February 2018 which noted a 3.5 x 3.5 cm aneurysm and recommended 1 year follow-up. I do not see any follow-up studies. Repeat ultrasound of the aorta for disease progression.  Mixed hyperlipidemia Carries a history of hyperlipidemia but currently not on lipid-lowering agents. Recommend following up with PCP for fasting lipids to see if medical therapy is warranted.  Total time spent: 67 minutes. Reviewed serial EKGs from April 2010 to the most current. Reviewed the last progress note from referring physician dated 08/16/2023 Reviewed the results of the vascular ultrasound from 05/2016, GXT from 06/02/2016, last echocardiogram 06/05/2018 Ordering additional diagnostic workup as outlined above. Prescription drug management. Coordinating consultation with electrophysiology given his symptomatic conduction disease.  Orders Placed:  Orders Placed This Encounter  Procedures   Comp Met (CMET)    Standing Status:   Future    Number of Occurrences:   1    Expected Date:   10/31/2023    Expiration Date:   10/23/2024   TSH    Standing Status:   Future    Number of Occurrences:   1    Expected Date:   10/31/2023    Expiration Date:   10/23/2024    Magnesium     Standing Status:   Future    Number of Occurrences:   1    Expected Date:   10/31/2023    Expiration Date:   10/23/2024   EKG 12-Lead   EKG 12-Lead   ECHOCARDIOGRAM COMPLETE    Standing Status:   Future    Expected Date:   10/31/2023    Expiration Date:   10/23/2024    Where should this test be performed:   [facility]    Does the patient weigh less than or greater than 250 lbs?:   Patient weighs less than 250 lbs    Perflutren DEFINITY (image enhancing agent) should be administered unless hypersensitivity or allergy exist:   Administer Perflutren    Reason for exam-Echo:   Other-Full Diagnosis List    Full ICD-10/Reason for Exam:   Precordial pain [786.51.ICD-9-CM]     Final Medication List:    Meds ordered this encounter  Medications   hydrALAZINE  (APRESOLINE ) 10 MG tablet    Sig: Take 1 tablet (10 mg total) by mouth in the morning and at bedtime. HOLD if systolic blood pressure (top number) is less than 140.    Dispense:  30 tablet    Refill:  3    Medications Discontinued During This Encounter  Medication Reason   omeprazole  (PRILOSEC) 40 MG capsule Patient Preference   atenolol  (TENORMIN ) 25 MG tablet Discontinued by provider     Current Outpatient Medications:    albuterol  (VENTOLIN  HFA) 108 (90 Base) MCG/ACT inhaler, Inhale 2 puffs into the lungs every 6 (six) hours as needed for wheezing or shortness of breath., Disp: 8 g, Rfl: 2   ALPRAZolam  (XANAX ) 0.5 MG tablet, Take 1 tablet (0.5 mg total) by mouth 3 (three) times daily as needed for anxiety., Disp: 30 tablet, Rfl: 3   amLODipine  (NORVASC ) 5 MG tablet, Take 1 tablet (5 mg total) by mouth daily., Disp: 90 tablet, Rfl: 3   aspirin 81 MG EC tablet, aspirin 81 mg tablet,delayed release  TAKE 1 TABLET BY MOUTH TWICE A DAY, Disp: , Rfl:    hydrALAZINE  (APRESOLINE ) 10 MG tablet, Take  1 tablet (10 mg total) by mouth in the morning and at bedtime. HOLD if systolic blood pressure (top number) is less than  140., Disp: 30 tablet, Rfl: 3   hydrochlorothiazide  (HYDRODIURIL ) 25 MG tablet, Take 1 tablet (25 mg total) by mouth daily., Disp: 90 tablet, Rfl: 3   losartan  (COZAAR ) 100 MG tablet, TAKE 1 TABLET BY MOUTH EVERY DAY, Disp: 90 tablet, Rfl: 1   meloxicam  (MOBIC ) 15 MG tablet, Take 1 tablet (15 mg total) by mouth daily., Disp: 90 tablet, Rfl: 1   potassium chloride  SA (KLOR-CON  M) 20 MEQ tablet, Take 1 tablet (20 mEq total) by mouth daily., Disp: 3 tablet, Rfl: 0   tadalafil  (CIALIS ) 20 MG tablet, Take 0.5-1 tablets (10-20 mg total) by mouth every other day as needed for erectile dysfunction., Disp: 10 tablet, Rfl: 11  Consent:   N/A  Disposition:   6-week follow-up sooner if needed Patient may be asked to follow-up sooner based on the results of the above-mentioned testing.  His questions and concerns were addressed to his satisfaction. He voices understanding of the recommendations provided during this encounter.    Signed, Madonna Michele HAS, [facility]  A Division of [facility] [address], [place], [place] [address]  [place], [place] [address] 10/24/2023 5:56 PM

## 2023-10-24 NOTE — Progress Notes (Incomplete)
  Electrophysiology Office Note:   Date:  10/24/2023  ID:  Keith Pratt, DOB 1950/08/10, MRN [id number]  Primary Cardiologist: Madonna Large, DO Electrophysiologist: None  {Click to update primary MD,subspecialty MD or APP then REFRESH:1}    History of Present Illness:   Keith Pratt is a 73 y.o. male with h/o hypertension, hyperlipidemia, AAA seen today for ***  He has been experiencing very frequently lightheadedness, dizziness, shortness of breath with very minimal activity, and near syncope.   Discussed the use of AI scribe software for clinical note transcription with the patient, who gave verbal consent to proceed.  History of Present Illness     Review of systems complete and found to be negative unless listed in HPI.   EP Information / Studies Reviewed:    {EKGtoday:28818}      ***  Risk Assessment/Calculations:   {Does this patient have ATRIAL FIBRILLATION?:[phone]}          Physical Exam:   VS:  There were no vitals taken for this visit.   Wt Readings from Last 3 Encounters:  10/24/23 217 lb 3.2 oz (98.5 kg)  08/16/23 217 lb (98.4 kg)  07/05/23 218 lb (98.9 kg)     GEN: Well nourished, well developed in no acute distress NECK: No JVD; No carotid bruits CARDIAC: {EPRHYTHM:28826}, no murmurs, rubs, gallops RESPIRATORY:  Clear to auscultation without rales, wheezing or rhonchi  ABDOMEN: Soft, non-tender, non-distended EXTREMITIES:  No edema; No deformity   ASSESSMENT AND PLAN:   Assessment and Plan Assessment & Plan       Follow up with {EPMDS:28135::EP Team} {EPFOLLOW LE:71826}  Signed, Fonda Kitty, MD

## 2023-10-24 NOTE — Progress Notes (Deleted)
  Electrophysiology Office Note:   Date:  10/24/2023  ID:  Keith Pratt, DOB 09-Sep-1950, MRN [id number]  Primary Cardiologist: Madonna Large, DO Electrophysiologist: None  {Click to update primary MD,subspecialty MD or APP then REFRESH:1}    History of Present Illness:   Keith Pratt is a 73 y.o. male with h/o hypertension, hyperlipidemia, AAA seen today for pacemaker evaluation at the request of Dr. Large.   He has been experiencing very frequently lightheadedness, dizziness, shortness of breath with very minimal activity, and near syncope.   Discussed the use of AI scribe software for clinical note transcription with the patient, who gave verbal consent to proceed.  History of Present Illness     Review of systems complete and found to be negative unless listed in HPI.   EP Information / Studies Reviewed:    {EKGtoday:28818}      Echo 05/2018:   1. The left ventricle has normal systolic function, with an ejection  fraction of 55-60%. The cavity size was normal. There is mildly increased  left ventricular wall thickness. Left ventricular diastolic Doppler  parameters are consistent with impaired  relaxation No evidence of left ventricular regional wall motion  abnormalities.   2. The right ventricle has normal systolic function. The cavity was  normal. There is no increase in right ventricular wall thickness.   3. The mitral valve is normal in structure. Mild calcification of the  mitral valve leaflet. There is mild mitral annular calcification present.  No evidence of mitral valve stenosis. No regurgitation.   4. The tricuspid valve is normal in structure.   5. The aortic valve is tricuspid Mild calcification of the aortic valve.  no stenosis of the aortic valve.   6. The aortic root and ascending aorta are normal in size and structure.   7. The IVC was not visualized. Peak RV-RA gradient 21 mmHg.   Physical Exam:   VS:  There were no vitals taken for this  visit.   Wt Readings from Last 3 Encounters:  10/24/23 217 lb 3.2 oz (98.5 kg)  08/16/23 217 lb (98.4 kg)  07/05/23 218 lb (98.9 kg)     GEN: Well nourished, well developed in no acute distress NECK: No JVD CARDIAC: {EPRHYTHM:28826}, no murmurs, rubs, gallops RESPIRATORY:  Clear to auscultation without rales, wheezing or rhonchi  ABDOMEN: Soft, non-distended EXTREMITIES:  No edema; No deformity   ASSESSMENT AND PLAN:    #. High grade AV block:  #. Symptomatic bradycardia:   #Hypertension *** goal today.  Recommend checking blood pressures 1-2 times per week at home and recording the values.  Recommend bringing these recordings to the primary care physician.  Assessment & Plan       Follow up with {EPMDS:28135::EP Team} {EPFOLLOW LE:71826}  Signed, Fonda Kitty, MD

## 2023-10-25 ENCOUNTER — Encounter: Payer: Self-pay | Admitting: Emergency Medicine

## 2023-10-25 ENCOUNTER — Encounter (HOSPITAL_COMMUNITY)
Admission: RE | Disposition: A | Discharge status: Home / Self Care | Payer: Self-pay | Source: Home / Self Care | Attending: Internal Medicine

## 2023-10-25 ENCOUNTER — Ambulatory Visit: Admitting: Cardiology

## 2023-10-25 ENCOUNTER — Other Ambulatory Visit: Payer: Self-pay

## 2023-10-25 ENCOUNTER — Encounter (HOSPITAL_COMMUNITY): Payer: Self-pay | Admitting: Internal Medicine

## 2023-10-25 ENCOUNTER — Observation Stay (HOSPITAL_COMMUNITY)
Admission: RE | Admit: 2023-10-25 | Discharge: 2023-10-26 | Disposition: A | Discharge status: Home / Self Care | Attending: Internal Medicine | Admitting: Internal Medicine

## 2023-10-25 ENCOUNTER — Telehealth: Payer: Self-pay

## 2023-10-25 DIAGNOSIS — I441 Atrioventricular block, second degree: Secondary | ICD-10-CM | POA: Diagnosis not present

## 2023-10-25 DIAGNOSIS — I455 Other specified heart block: Secondary | ICD-10-CM | POA: Diagnosis not present

## 2023-10-25 DIAGNOSIS — E785 Hyperlipidemia, unspecified: Secondary | ICD-10-CM | POA: Insufficient documentation

## 2023-10-25 DIAGNOSIS — I10 Essential (primary) hypertension: Secondary | ICD-10-CM | POA: Diagnosis not present

## 2023-10-25 DIAGNOSIS — Z01818 Encounter for other preprocedural examination: Principal | ICD-10-CM

## 2023-10-25 HISTORY — PX: PACEMAKER IMPLANT: EP1218

## 2023-10-25 LAB — CBC
HCT: 39.9 % (ref 39.0–52.0)
Hemoglobin: 14.6 g/dL (ref 13.0–17.0)
MCH: 30.1 pg (ref 26.0–34.0)
MCHC: 36.6 g/dL — ABNORMAL HIGH (ref 30.0–36.0)
MCV: 82.3 fL (ref 80.0–100.0)
Platelets: 243 K/uL (ref 150–400)
RBC: 4.85 MIL/uL (ref 4.22–5.81)
RDW: 11.7 % (ref 11.5–15.5)
WBC: 8.5 K/uL (ref 4.0–10.5)
nRBC: 0 % (ref 0.0–0.2)

## 2023-10-25 LAB — POCT I-STAT, CHEM 8
BUN: 26 mg/dL — ABNORMAL HIGH (ref 8–23)
Calcium, Ion: 1.09 mmol/L — ABNORMAL LOW (ref 1.15–1.40)
Chloride: 95 mmol/L — ABNORMAL LOW (ref 98–111)
Creatinine, Ser: 1.8 mg/dL — ABNORMAL HIGH (ref 0.61–1.24)
Glucose, Bld: 104 mg/dL — ABNORMAL HIGH (ref 70–99)
HCT: 41 % (ref 39.0–52.0)
Hemoglobin: 13.9 g/dL (ref 13.0–17.0)
Potassium: 3.1 mmol/L — ABNORMAL LOW (ref 3.5–5.1)
Sodium: 136 mmol/L (ref 135–145)
TCO2: 28 mmol/L (ref 22–32)

## 2023-10-25 SURGERY — PACEMAKER IMPLANT

## 2023-10-25 MED ORDER — CARVEDILOL 12.5 MG PO TABS
12.5000 mg | ORAL_TABLET | Freq: Two times a day (BID) | ORAL | Status: DC
  Administered 2023-10-25 – 2023-10-26 (×2): 12.5 mg via ORAL
  Filled 2023-10-25 (×2): qty 1

## 2023-10-25 MED ORDER — CHLORHEXIDINE GLUCONATE 4 % EX SOLN
4.0000 | Freq: Once | CUTANEOUS | Status: DC
  Filled 2023-10-25: qty 60

## 2023-10-25 MED ORDER — MIDAZOLAM HCL 2 MG/2ML IJ SOLN
INTRAMUSCULAR | Status: DC | PRN
  Administered 2023-10-25 (×3): 1 mg via INTRAVENOUS

## 2023-10-25 MED ORDER — POVIDONE-IODINE 10 % EX SWAB
2.0000 | Freq: Once | CUTANEOUS | Status: AC
  Administered 2023-10-25: 2 via TOPICAL

## 2023-10-25 MED ORDER — MIDAZOLAM HCL 5 MG/5ML IJ SOLN: INTRAMUSCULAR | Status: DC | PRN

## 2023-10-25 MED ORDER — SODIUM CHLORIDE 0.9 % IV SOLN: INTRAVENOUS | Status: DC

## 2023-10-25 MED ORDER — ACETAMINOPHEN 325 MG PO TABS
325.0000 mg | ORAL_TABLET | ORAL | Status: DC | PRN
  Administered 2023-10-25: 650 mg via ORAL
  Filled 2023-10-25: qty 2

## 2023-10-25 MED ORDER — FENTANYL CITRATE (PF) 100 MCG/2ML IJ SOLN
INTRAMUSCULAR | Status: AC
  Filled 2023-10-25: qty 2

## 2023-10-25 MED ORDER — CEFAZOLIN SODIUM-DEXTROSE 2-4 GM/100ML-% IV SOLN: 2.0000 g | INTRAVENOUS | Status: AC

## 2023-10-25 MED ORDER — HYDROCHLOROTHIAZIDE 25 MG PO TABS: 25.0000 mg | ORAL_TABLET | Freq: Every day | ORAL | Status: DC

## 2023-10-25 MED ORDER — MIDAZOLAM HCL 2 MG/2ML IJ SOLN
INTRAMUSCULAR | Status: AC
  Filled 2023-10-25: qty 2

## 2023-10-25 MED ORDER — ONDANSETRON HCL 4 MG/2ML IJ SOLN: 4.0000 mg | Freq: Four times a day (QID) | INTRAMUSCULAR | Status: DC | PRN

## 2023-10-25 MED ORDER — ALPRAZOLAM 0.5 MG PO TABS
0.5000 mg | ORAL_TABLET | Freq: Three times a day (TID) | ORAL | Status: DC | PRN
  Administered 2023-10-25: 0.5 mg via ORAL
  Filled 2023-10-25: qty 1

## 2023-10-25 MED ORDER — LOSARTAN POTASSIUM 50 MG PO TABS: 100.0000 mg | ORAL_TABLET | Freq: Every day | ORAL | Status: DC

## 2023-10-25 MED ORDER — SODIUM CHLORIDE 0.9 % IV SOLN
INTRAVENOUS | Status: AC
  Administered 2023-10-25: 80 mg
  Filled 2023-10-25: qty 2

## 2023-10-25 MED ORDER — CEFAZOLIN SODIUM-DEXTROSE 2-4 GM/100ML-% IV SOLN
INTRAVENOUS | Status: AC
  Administered 2023-10-25: 2 g via INTRAVENOUS
  Filled 2023-10-25: qty 100

## 2023-10-25 MED ORDER — HEPARIN (PORCINE) IN NACL 1000-0.9 UT/500ML-% IV SOLN
INTRAVENOUS | Status: DC | PRN
  Administered 2023-10-25: 500 mL

## 2023-10-25 MED ORDER — GENTAMICIN SULFATE 40 MG/ML IJ SOLN: 80.0000 mg | INTRAMUSCULAR | Status: AC

## 2023-10-25 MED ORDER — FENTANYL CITRATE (PF) 100 MCG/2ML IJ SOLN
INTRAMUSCULAR | Status: DC | PRN
  Administered 2023-10-25 (×3): 12.5 ug via INTRAVENOUS

## 2023-10-25 MED ORDER — LIDOCAINE HCL (PF) 1 % IJ SOLN
INTRAMUSCULAR | Status: DC | PRN
  Administered 2023-10-25: 30 mL

## 2023-10-25 MED ORDER — CEFAZOLIN SODIUM-DEXTROSE 1-4 GM/50ML-% IV SOLN
1.0000 g | Freq: Four times a day (QID) | INTRAVENOUS | Status: DC
  Administered 2023-10-25 – 2023-10-26 (×2): 1 g via INTRAVENOUS
  Filled 2023-10-25 (×3): qty 50

## 2023-10-25 MED ORDER — LIDOCAINE HCL (PF) 1 % IJ SOLN
INTRAMUSCULAR | Status: AC
  Filled 2023-10-25: qty 60

## 2023-10-25 SURGICAL SUPPLY — 16 items
CABLE SURGICAL S-101-97-12 (CABLE) ×2 IMPLANT
CATH CPS LOCATOR 3D MED XLNG (CATHETERS) IMPLANT
ELECT DEFIB PAD ADLT CADENCE (PAD) IMPLANT
GUIDEWIRE ANGLED .035X150CM (WIRE) IMPLANT
GUIDEWIRE VASC J-TIP .035X150 (WIRE) IMPLANT
KIT MICROPUNCTURE NIT STIFF (SHEATH) IMPLANT
LEAD ULTIPACE 52 LPA1231/52 (Lead) IMPLANT
LEAD ULTIPACE 65 LPA1231/65 (Lead) IMPLANT
PACEMAKER ASSURITY DR-RF (Pacemaker) IMPLANT
SHEATH 7FR PRELUDE SNAP 13 (SHEATH) IMPLANT
SHEATH 9FR PRELUDE SNAP 13 (SHEATH) IMPLANT
SHEATH PROBE COVER 6X72 (BAG) IMPLANT
SLITTER AGILIS HISPRO (INSTRUMENTS) IMPLANT
SYR CONTROL 10ML ANGIOGRAPHIC (SYRINGE) IMPLANT
TOOL HELIX LOCKING (MISCELLANEOUS) IMPLANT
TRAY PACEMAKER INSERTION (PACKS) ×2 IMPLANT

## 2023-10-25 NOTE — Discharge Instructions (Addendum)
 After Your Pacemaker   You have a Abbott Pacemaker  ACTIVITY Do not lift your arm above shoulder height for 1 week after your procedure. After 7 days, you may progress as below.  You should remove your sling 24 hours after your procedure, unless otherwise instructed by your provider.     Thursday November 01, 2023  Friday November 02, 2023 Saturday November 03, 2023 Sunday November 04, 2023   Do not lift, push, pull, or carry anything over 10 pounds with the affected arm until 6 weeks (Thursday December 06, 2023 ) after your procedure.   You may drive AFTER your wound check, unless you have been told otherwise by your provider.   Ask your healthcare provider when you can go back to work   INCISION/Dressing If you are on a blood thinner such as Coumadin, Xarelto, Eliquis, Plavix, or Pradaxa please confirm with your provider when this should be resumed.   If large square, outer bandage is left in place, this can be removed after 24 hours from your procedure. Do not remove steri-strips or glue as below.   If a PRESSURE DRESSING (a bulky dressing that usually goes up over your shoulder) was applied or left in place, please follow instructions given by your provider on when to return to have this removed.   Monitor your Pacemaker site for redness, swelling, and drainage. Call the device clinic at [phone] if you experience these symptoms or fever/chills.  If your incision is sealed with Steri-strips or staples, you may shower 7 days after your procedure or when told by your provider. Do not remove the steri-strips or let the shower hit directly on your site. You may wash around your site with soap and water.    If you were discharged in a sling, please do not wear this during the day more than 48 hours after your surgery unless otherwise instructed. This may increase the risk of stiffness and soreness in your shoulder.   Avoid lotions, ointments, or perfumes over your incision until it is  well-healed.  You may use a hot tub or a pool AFTER your wound check appointment if the incision is completely closed.  Pacemaker Alerts:  Some alerts are vibratory and others beep. These are NOT emergencies. Please call our office to let us  know. If this occurs at night or on weekends, it can wait until the next business day. Send a remote transmission.  If your device is capable of reading fluid status (for heart failure), you will be offered monthly monitoring to review this with you.   DEVICE MANAGEMENT Remote monitoring is used to monitor your pacemaker from home. This monitoring is scheduled every 91 days by our office. It allows us  to keep an eye on the functioning of your device to ensure it is working properly. You will routinely see your Electrophysiologist annually (more often if necessary).   You should receive your ID card for your new device in 4-8 weeks. Keep this card with you at all times once received. Consider wearing a medical alert bracelet or necklace.  Your Pacemaker may be MRI compatible. This will be discussed at your next office visit/wound check.  You should avoid contact with strong electric or magnetic fields.   Do not use amateur (ham) radio equipment or electric (arc) welding torches. MP3 player headphones with magnets should not be used. Some devices are safe to use if held at least 12 inches (30 cm) from your Pacemaker. These include power tools, lawn  mowers, and speakers. If you are unsure if something is safe to use, ask your health care provider.  When using your cell phone, hold it to the ear that is on the opposite side from the Pacemaker. Do not leave your cell phone in a pocket over the Pacemaker.  You may safely use electric blankets, heating pads, computers, and microwave ovens.  Call the office right away if: You have chest pain. You feel more short of breath than you have felt before. You feel more light-headed than you have felt before. Your  incision starts to open up.  This information is not intended to replace advice given to you by your health care provider. Make sure you discuss any questions you have with your health care provider.

## 2023-10-25 NOTE — H&P (Signed)
 EP attending  Patient seen and examined.  I have discussed with Dr. Kennyth.  Patient is a 73 year old man with a history of dizzy spells and near syncope who presented to the cardiology office yesterday with fatigue, weakness and the above complaints and was found to be in high-grade heart block.  The patient does not have angina.  The patient has been worked up in the past with prior normal LV function and known conduction system disease with right bundle branch block at his most recent baseline.  He has heart rates in the 30s.  He is previously on low-dose beta-blocker but this has been discontinued and his conduction system problems persist with 2 and 3-1 AV block.  He presents for pacemaker insertion.  On physical exam is a pleasant well-appearing 73 year old man in no distress.  The blood pressure was 174/69, the pulse was 48, the respirations were 18 on exam is a pleasant elderly man in no acute distress.  Lungs are clear bilaterally the cardiovascular exam for regular bradycardia.  Extremities demonstrate no edema.  Telemetry demonstrates sinus rhythm with high-grade heart block.  Assessment and plan 1.  High-grade heart block -  I discussed the treatment options with the patient in detail.  The risk, goals, benefits, and expectations of permanent pacemaker insertion were reviewed and he wishes to proceed. 2.  Uncontrolled hypertension - once his pacemaker is in place, his medical therapy will be uptitrated to help control his blood pressure.  Danelle Birmingham, MD

## 2023-10-25 NOTE — Telephone Encounter (Signed)
 Per Dr. Shaune request - I called pt and informed him that he would not need to come in for his o/v today. Dr. Kennyth and Dr. Waddell reviewed his EKG and he has CHB and needs a PPM Implant today.   The patient agreed to be at [facility] by 11:30 for a 1:30 procedure time. He has a friend that can bring him and will pick him up tomorrow to take him home.   He verified that he has not ate any breakfast. Only drank some coffee.   I called short stay to inform them of the add on.

## 2023-10-26 ENCOUNTER — Observation Stay (HOSPITAL_COMMUNITY)

## 2023-10-26 ENCOUNTER — Other Ambulatory Visit (HOSPITAL_COMMUNITY): Payer: Self-pay

## 2023-10-26 DIAGNOSIS — I10 Essential (primary) hypertension: Secondary | ICD-10-CM | POA: Diagnosis not present

## 2023-10-26 DIAGNOSIS — E785 Hyperlipidemia, unspecified: Secondary | ICD-10-CM | POA: Diagnosis not present

## 2023-10-26 DIAGNOSIS — I455 Other specified heart block: Secondary | ICD-10-CM | POA: Diagnosis not present

## 2023-10-26 DIAGNOSIS — Z950 Presence of cardiac pacemaker: Secondary | ICD-10-CM | POA: Diagnosis not present

## 2023-10-26 DIAGNOSIS — I441 Atrioventricular block, second degree: Secondary | ICD-10-CM

## 2023-10-26 MED ORDER — CARVEDILOL 12.5 MG PO TABS
12.5000 mg | ORAL_TABLET | Freq: Two times a day (BID) | ORAL | 6 refills | Status: DC
  Filled 2023-10-26: qty 60, 30d supply, fill #0

## 2023-10-26 MED ORDER — ACETAMINOPHEN 325 MG PO TABS: 325.0000 mg | ORAL_TABLET | ORAL | Status: DC | PRN

## 2023-10-26 NOTE — Progress Notes (Signed)
 Preparing pt for discharge, IV removed, cath intact, discharge instructions reviewed with patient, pt verbalizes understanding, pt to pick up meds in TOC. Pt will be discharged to discharge lounge to await transport.

## 2023-10-26 NOTE — Discharge Summary (Cosign Needed)
 ELECTROPHYSIOLOGY PROCEDURE DISCHARGE SUMMARY    Patient ID: [name],  MRN: [id number], DOB/AGE: 06-21-1950 73 y.o.  Admit date: 10/25/2023 Discharge date: 10/26/2023  Primary Care Physician: Duanne Butler DASEN, MD  Primary Cardiologist: Madonna Large, DO  Electrophysiologist: Dr. Waddell   Primary Discharge Diagnosis:  CHB status post pacemaker implantation this admission  Secondary Discharge Diagnosis HTN HLD H/o AAA  Allergies  Allergen Reactions   Amlodipine  Swelling    Swelling in feet and ankles   Cardura  [Doxazosin  Mesylate] Other (See Comments)    Dizzy, hot all over - per pt jusr felt sick   Codeine     REACTION: nauses and vomiting     Procedures This Admission:  1.  Implantation of a Abbott Dual Chamber PPM on 10/25/2023 by Dr. Waddell. The patient received a Abbott Assurity [id number] with a Abbott Ultipace 1231-52 right atrial lead and a Abbott Ultipace 1231-65 right ventricular lead.  There were no immediate post procedure complications.   2.  CXR on 10/26/2023 demonstrated no pneumothorax status post device implantation.       Brief HPI: Keith Pratt is a 73 y.o. male was referred to electrophysiology in the outpatient setting for  consideration of PPM implantation.  Past medical history includes above.  The patient has had AV block without reversible causes identified.  Risks, benefits, and alternatives to PPM implantation were reviewed with the patient who wished to proceed.   Hospital Course:  The patient was admitted and underwent implantation of a Abbott dual chamber PPM with details as outlined above.  He was monitored on telemetry overnight which demonstrated appropriate pacing.  Left chest was without hematoma or ecchymosis.  The device was interrogated and found to be functioning normally.  CXR was obtained and demonstrated no pneumothorax status post device implantation.  Wound care, arm mobility, and restrictions were reviewed with  the patient.  The patient was examined and considered stable for discharge to home.    Anticoagulation resumption This patient is not on anticoagulation     Physical Exam: Vitals:   10/26/23 0000 10/26/23 0200 10/26/23 0300 10/26/23 0400  BP:   138/66   Pulse: 60 60 60 60  Resp: 15 20 16 11   Temp:   98 F (36.7 C)   TempSrc:   Oral   SpO2: 94% 91% 93% 90%  Weight:      Height:        GEN- NAD. A&O x 3.  HEENT: Normocephalic, atraumatic Lungs- CTAB, Normal effort.  Heart- RRR, No M/G/R.  GI- Soft, NT, ND.  Extremities- No clubbing, cyanosis, or edema;  Skin- warm and dry, no rash or lesion, left chest without hematoma/ecchymosis  Discharge Medications:  Allergies as of 10/26/2023       Reactions   Amlodipine  Swelling   Swelling in feet and ankles   Cardura  [doxazosin  Mesylate] Other (See Comments)   Dizzy, hot all over - per pt jusr felt sick   Codeine    REACTION: nauses and vomiting        Medication List     TAKE these medications    acetaminophen  325 MG tablet Commonly known as: TYLENOL  Take 1-2 tablets (325-650 mg total) by mouth every 4 (four) hours as needed for mild pain (pain score 1-3).   albuterol  108 (90 Base) MCG/ACT inhaler Commonly known as: VENTOLIN  HFA Inhale 2 puffs into the lungs every 6 (six) hours as needed for wheezing or shortness of breath.   ALPRAZolam   0.5 MG tablet Commonly known as: XANAX  Take 1 tablet (0.5 mg total) by mouth 3 (three) times daily as needed for anxiety.   amLODipine  5 MG tablet Commonly known as: NORVASC  Take 1 tablet (5 mg total) by mouth daily.   aspirin EC 81 MG tablet aspirin 81 mg tablet,delayed release  TAKE 1 TABLET BY MOUTH TWICE A DAY   carvedilol  12.5 MG tablet Commonly known as: COREG  Take 1 tablet (12.5 mg total) by mouth 2 (two) times daily with a meal.   hydrALAZINE  10 MG tablet Commonly known as: APRESOLINE  Take 1 tablet (10 mg total) by mouth in the morning and at bedtime. HOLD if  systolic blood pressure (top number) is less than 140.   hydrochlorothiazide  25 MG tablet Commonly known as: HYDRODIURIL  Take 1 tablet (25 mg total) by mouth daily.   losartan  100 MG tablet Commonly known as: COZAAR  TAKE 1 TABLET BY MOUTH EVERY DAY   meloxicam  15 MG tablet Commonly known as: MOBIC  Take 1 tablet (15 mg total) by mouth daily.   potassium chloride  SA 20 MEQ tablet Commonly known as: KLOR-CON  M Take 1 tablet (20 mEq total) by mouth daily.   tadalafil  20 MG tablet Commonly known as: CIALIS  Take 0.5-1 tablets (10-20 mg total) by mouth every other day as needed for erectile dysfunction.        Disposition:  Home with usual follow up as in AVS   Duration of Discharge Encounter:  APP time: 22 minutes  Signed, Ozell Prentice Passey, PA-C  10/26/2023 8:04 AM   EP Attending  Patient seen and examined. Agree with the findings as noted above. The patient is stable for DC and interrogation of his DDD PM under my direction demonstrates normal DDD PM function. CXR shows no PTX and good lead position. His incision demonstrates no hematoma. He can be discharged home with usual followup.  Danelle Franciscojavier Wronski,MD

## 2023-11-04 ENCOUNTER — Other Ambulatory Visit: Payer: Self-pay | Admitting: Family Medicine

## 2023-11-06 NOTE — Telephone Encounter (Signed)
 Unable to refill per protocol, Rx expired. Discontinued 10/24/23.  Requested Prescriptions  Pending Prescriptions Disp Refills   omeprazole  (PRILOSEC) 40 MG capsule [Pharmacy Med Name: OMEPRAZOLE  DR 40 MG CAPSULE] 90 capsule 3    Sig: TAKE 1 CAPSULE (40 MG TOTAL) BY MOUTH DAILY.     Gastroenterology: Proton Pump Inhibitors Passed - 11/06/2023  2:13 PM      Passed - Valid encounter within last 12 months    Recent Outpatient Visits           2 months ago Benign essential HTN   [place] [facility] Duanne, Butler DASEN, MD   4 months ago Chest pain, unspecified type   [place] [facility] Duanne Butler DASEN, MD   4 months ago Hypertensive urgency   [place] [facility] Kayla Jeoffrey RAMAN, FNP   9 months ago Colon cancer screening   [place] [facility] Duanne Butler DASEN, MD   1 year ago Acute midline low back pain with bilateral sciatica   [place] [facility] Pickard, Butler DASEN, MD       Future Appointments             In 1 month [place], [place], DO [facility] [employer] [facility] [employer]. [facility] [employer], H&[facility]   In 2 months Pickard, Butler DASEN, MD [facility], [facility]

## 2023-11-08 ENCOUNTER — Ambulatory Visit: Attending: Cardiology | Admitting: *Deleted

## 2023-11-08 DIAGNOSIS — I495 Sick sinus syndrome: Secondary | ICD-10-CM

## 2023-11-08 NOTE — Patient Instructions (Signed)

## 2023-11-09 NOTE — Progress Notes (Signed)
 Normal dual chamber pacemaker wound check. Presenting rhythm: AP-AS/VP. Wound well healed. Routine testing performed. Thresholds, sensing, and impedances consistent with implant measurements and at 3.5V safety margin/auto capture until 3 month visit. No episodes. Reviewed arm restrictions to continue for 6 weeks total post op.  Pt enrolled in remote follow-up.

## 2023-11-18 ENCOUNTER — Other Ambulatory Visit: Payer: Self-pay | Admitting: Family Medicine

## 2023-11-26 ENCOUNTER — Other Ambulatory Visit: Payer: Self-pay | Admitting: Family Medicine

## 2023-11-27 ENCOUNTER — Other Ambulatory Visit (HOSPITAL_COMMUNITY): Payer: Self-pay

## 2023-12-07 ENCOUNTER — Ambulatory Visit (INDEPENDENT_AMBULATORY_CARE_PROVIDER_SITE_OTHER): Admitting: Family Medicine

## 2023-12-07 ENCOUNTER — Encounter: Payer: Self-pay | Admitting: Family Medicine

## 2023-12-07 VITALS — BP 146/86 | HR 67 | Ht 74.0 in | Wt 218.0 lb

## 2023-12-07 DIAGNOSIS — I10 Essential (primary) hypertension: Secondary | ICD-10-CM | POA: Diagnosis not present

## 2023-12-07 MED ORDER — OMEPRAZOLE 40 MG PO CPDR: 40.0000 mg | DELAYED_RELEASE_CAPSULE | Freq: Every day | ORAL | 3 refills | Status: DC

## 2023-12-07 NOTE — Progress Notes (Signed)
 Subjective:    Patient ID: Keith Pratt, male    DOB: [birth year], 73 y.o.   MRN: [id number]  Since I last saw the patient, he had a pacemaker for secondary AV block.  He states that he is feeling much better.  He has more energy and less shortness of breath.  However his blood pressure is extremely high today.  I personally rechecked his blood pressure and found it to be 176/92.  However he also has whitecoat syndrome.  His machine, my nurse, and my blood pressure will wildly different.  Seem like his blood pressure was increasing every time we checked it.  I believe the patient was getting more anxious the more we rechecked his blood pressure.  He is currently on carvedilol  12.5 mg twice daily, hydrochlorothiazide  25 mg daily, amlodipine  5 mg daily, losartan  100 mg daily and then hydralazine  for breakthrough.  He is not using hydralazine .  Of note, his labs in the hospital were significant for a creatinine of 1.8.  He is currently taking meloxicam   Past Medical History:  Diagnosis Date   AAA (abdominal aortic aneurysm) (HCC)    3.5 cm   Anxiety    Arthritis    Cancer (HCC)    basal cell CA-under left arm   Cataract    [employer] 06/29/2020   COPD (chronic obstructive pulmonary disease) (HCC)    Hyperlipidemia    Hypertension    Shingles    July 2014   Thyroid  disease    as a child- no problem as adult   Ulcer    Past Surgical History:  Procedure Laterality Date   CATARACT EXTRACTION     right eye   COLONOSCOPY     EYE SURGERY N/A    [employer] 06/29/2020   JOINT REPLACEMENT N/A    [employer] 06/29/2020   PACEMAKER IMPLANT N/A 10/25/2023   Procedure: PACEMAKER IMPLANT;  Surgeon: Waddell Danelle ORN, MD;  Location: [facility] INVASIVE CV LAB;  Service: Cardiovascular;  Laterality: N/A;   right arm surgery     TONSILLECTOMY     Current Outpatient Medications on File Prior to Visit  Medication Sig Dispense Refill   ALPRAZolam  (XANAX ) 0.5 MG tablet TAKE 1 TABLET (0.5 MG TOTAL) BY MOUTH 3  (THREE) TIMES DAILY AS NEEDED FOR ANXIETY. 30 tablet 3   amLODipine  (NORVASC ) 5 MG tablet Take 1 tablet (5 mg total) by mouth daily. 90 tablet 3   carvedilol  (COREG ) 12.5 MG tablet Take 1 tablet (12.5 mg total) by mouth 2 (two) times daily with a meal. 60 tablet 6   hydrALAZINE  (APRESOLINE ) 10 MG tablet Take 1 tablet (10 mg total) by mouth in the morning and at bedtime. HOLD if systolic blood pressure (top number) is less than 140. 30 tablet 3   hydrochlorothiazide  (HYDRODIURIL ) 25 MG tablet Take 1 tablet (25 mg total) by mouth daily. 90 tablet 3   losartan  (COZAAR ) 100 MG tablet TAKE 1 TABLET BY MOUTH EVERY DAY 90 tablet 1   meloxicam  (MOBIC ) 15 MG tablet Take 1 tablet (15 mg total) by mouth daily. 90 tablet 1   aspirin 81 MG EC tablet aspirin 81 mg tablet,delayed release  TAKE 1 TABLET BY MOUTH TWICE A DAY (Patient not taking: Reported on 12/07/2023)     potassium chloride  SA (KLOR-CON  M) 20 MEQ tablet Take 1 tablet (20 mEq total) by mouth daily. (Patient not taking: Reported on 12/07/2023) 3 tablet 0   tadalafil  (CIALIS ) 20 MG tablet Take 0.5-1 tablets (10-20 mg  total) by mouth every other day as needed for erectile dysfunction. (Patient not taking: Reported on 12/07/2023) 10 tablet 11   No current facility-administered medications on file prior to visit.   Allergies  Allergen Reactions   Amlodipine  Swelling    Swelling in feet and ankles   Cardura  [Doxazosin  Mesylate] Other (See Comments)    Dizzy, hot all over - per pt jusr felt sick   Codeine     REACTION: nauses and vomiting   Social History   Socioeconomic History   Marital status: Divorced    Spouse name: Not on file   Number of children: Not on file   Years of education: Not on file   Highest education level: Some college, no degree  Occupational History   Not on file  Tobacco Use   Smoking status: Former    Current packs/day: 0.00    Types: Cigarettes    Quit date: 01/08/2003    Years since quitting: 20.9   Smokeless  tobacco: Never  Substance and Sexual Activity   Alcohol use: No   Drug use: No   Sexual activity: Yes  Other Topics Concern   Not on file  Social History Narrative   Not on file   Social Drivers of Health   Financial Resource Strain: Low Risk  (08/13/2023)   Overall Financial Resource Strain (CARDIA)    Difficulty of Paying Living Expenses: Not very hard  Food Insecurity: No Food Insecurity (10/25/2023)   Hunger Vital Sign    Worried About Running Out of Food in the Last Year: Never true    Ran Out of Food in the Last Year: Never true  Transportation Needs: No Transportation Needs (10/25/2023)   PRAPARE - Administrator, Civil Service (Medical): No    Lack of Transportation (Non-Medical): No  Physical Activity: Sufficiently Active (08/13/2023)   Exercise Vital Sign    Days of Exercise per Week: 5 days    Minutes of Exercise per Session: 30 min  Recent Concern: Physical Activity - Inactive (08/09/2023)   Exercise Vital Sign    Days of Exercise per Week: 0 days    Minutes of Exercise per Session: 0 min  Stress: Stress Concern Present (08/13/2023)   [employer] of Occupational Health - Occupational Stress Questionnaire    Feeling of Stress : To some extent  Social Connections: Socially Isolated (10/25/2023)   Social Connection and Isolation Panel    Frequency of Communication with Friends and Family: Twice a week    Frequency of Social Gatherings with Friends and Family: Twice a week    Attends Religious Services: Never    Database administrator or Organizations: No    Attends Banker Meetings: Never    Marital Status: Divorced  Catering manager Violence: Not At Risk (10/25/2023)   Humiliation, Afraid, Rape, and Kick questionnaire    Fear of Current or Ex-Partner: No    Emotionally Abused: No    Physically Abused: No    Sexually Abused: No      Review of Systems  All other systems reviewed and are negative.      Objective:   Physical  Exam Vitals reviewed.  Constitutional:      General: He is not in acute distress.    Appearance: Normal appearance. He is well-developed and normal weight. He is not diaphoretic.  HENT:     Head: Normocephalic and atraumatic.  Neck:     Thyroid : No thyromegaly.  Vascular: No carotid bruit or JVD.     Trachea: No tracheal deviation.  Cardiovascular:     Rate and Rhythm: Normal rate and regular rhythm.     Pulses: Normal pulses.     Heart sounds: Normal heart sounds. No murmur heard.    No friction rub. No gallop.  Pulmonary:     Effort: Pulmonary effort is normal. No respiratory distress.     Breath sounds: Normal breath sounds. No stridor. No wheezing or rales.  Chest:     Chest wall: No tenderness.  Abdominal:     General: Bowel sounds are normal. There is no distension.     Palpations: Abdomen is soft. There is no mass.     Tenderness: There is no abdominal tenderness. There is no guarding or rebound.  Musculoskeletal:     Cervical back: Neck supple. No rigidity.     Right lower leg: No edema.     Left lower leg: No edema.  Lymphadenopathy:     Cervical: No cervical adenopathy.  Skin:    General: Skin is warm.     Coloration: Skin is not pale.     Findings: No erythema or rash.  Neurological:     Mental Status: He is alert and oriented to person, place, and time.     Cranial Nerves: No cranial nerve deficit.     Motor: No abnormal muscle tone.     Coordination: Coordination normal.     Deep Tendon Reflexes: Reflexes are normal and symmetric.  Psychiatric:        Behavior: Behavior normal.        Thought Content: Thought content normal.        Judgment: Judgment normal.           Assessment & Plan:  Benign essential HTN - Plan: CBC with Differential/Platelet, Comprehensive metabolic panel with GFR Patient's blood pressure remains elevated significantly.  Increase amlodipine  to 10 mg a day and recheck blood pressure in 2 weeks.  Discontinue meloxicam   immediately recheck CBC and CMP.  If creatinine is elevated check urine protein creatinine ratio and renal ultrasound.  Avoid all NSAIDs.

## 2023-12-08 LAB — COMPREHENSIVE METABOLIC PANEL WITH GFR
AG Ratio: 1.8 (calc) (ref 1.0–2.5)
ALT: 31 U/L (ref 9–46)
AST: 27 U/L (ref 10–35)
Albumin: 4.5 g/dL (ref 3.6–5.1)
Alkaline phosphatase (APISO): 59 U/L (ref 35–144)
BUN/Creatinine Ratio: 11 (calc) (ref 6–22)
BUN: 16 mg/dL (ref 7–25)
CO2: 33 mmol/L — ABNORMAL HIGH (ref 20–32)
Calcium: 10 mg/dL (ref 8.6–10.3)
Chloride: 95 mmol/L — ABNORMAL LOW (ref 98–110)
Creat: 1.49 mg/dL — ABNORMAL HIGH (ref 0.70–1.28)
Globulin: 2.5 g/dL (ref 1.9–3.7)
Glucose, Bld: 92 mg/dL (ref 65–99)
Potassium: 3.6 mmol/L (ref 3.5–5.3)
Sodium: 137 mmol/L (ref 135–146)
Total Bilirubin: 0.6 mg/dL (ref 0.2–1.2)
Total Protein: 7 g/dL (ref 6.1–8.1)
eGFR: 49 mL/min/1.73m2 — ABNORMAL LOW (ref >=60)

## 2023-12-08 LAB — CBC WITH DIFFERENTIAL/PLATELET
Absolute Lymphocytes: 2398 {cells}/uL (ref 850–3900)
Absolute Monocytes: 691 {cells}/uL (ref 200–950)
Basophils Absolute: 94 {cells}/uL (ref 0–200)
Basophils Relative: 1.3 %
Eosinophils Absolute: 511 {cells}/uL — ABNORMAL HIGH (ref 15–500)
Eosinophils Relative: 7.1 %
HCT: 36.3 % — ABNORMAL LOW (ref 38.5–50.0)
Hemoglobin: 12.4 g/dL — ABNORMAL LOW (ref 13.2–17.1)
MCH: 29.8 pg (ref 27.0–33.0)
MCHC: 34.2 g/dL (ref 32.0–36.0)
MCV: 87.3 fL (ref 80.0–100.0)
MPV: 10.2 fL (ref 7.5–12.5)
Monocytes Relative: 9.6 %
Neutro Abs: 3506 {cells}/uL (ref 1500–7800)
Neutrophils Relative %: 48.7 %
Platelets: 240 Thousand/uL (ref 140–400)
RBC: 4.16 Million/uL — ABNORMAL LOW (ref 4.20–5.80)
RDW: 12.9 % (ref 11.0–15.0)
Total Lymphocyte: 33.3 %
WBC: 7.2 Thousand/uL (ref 3.8–10.8)

## 2023-12-10 ENCOUNTER — Encounter: Payer: Self-pay | Admitting: Cardiology

## 2023-12-10 ENCOUNTER — Other Ambulatory Visit: Payer: Self-pay

## 2023-12-10 ENCOUNTER — Ambulatory Visit: Payer: Self-pay | Admitting: Family Medicine

## 2023-12-10 ENCOUNTER — Ambulatory Visit: Attending: Cardiology | Admitting: Cardiology

## 2023-12-10 VITALS — BP 148/88 | HR 74 | Resp 16 | Ht 74.0 in | Wt 219.6 lb

## 2023-12-10 DIAGNOSIS — I714 Abdominal aortic aneurysm, without rupture, unspecified: Secondary | ICD-10-CM | POA: Diagnosis not present

## 2023-12-10 DIAGNOSIS — R072 Precordial pain: Secondary | ICD-10-CM

## 2023-12-10 DIAGNOSIS — Z950 Presence of cardiac pacemaker: Secondary | ICD-10-CM | POA: Diagnosis not present

## 2023-12-10 DIAGNOSIS — I459 Conduction disorder, unspecified: Secondary | ICD-10-CM

## 2023-12-10 DIAGNOSIS — I10 Essential (primary) hypertension: Secondary | ICD-10-CM

## 2023-12-10 DIAGNOSIS — Z87891 Personal history of nicotine dependence: Secondary | ICD-10-CM | POA: Diagnosis not present

## 2023-12-10 DIAGNOSIS — I495 Sick sinus syndrome: Secondary | ICD-10-CM | POA: Diagnosis not present

## 2023-12-10 DIAGNOSIS — E782 Mixed hyperlipidemia: Secondary | ICD-10-CM | POA: Diagnosis not present

## 2023-12-10 DIAGNOSIS — E785 Hyperlipidemia, unspecified: Secondary | ICD-10-CM

## 2023-12-10 NOTE — Patient Instructions (Addendum)
 Medication Instructions:  Please call our office with a current medication list when you get home. *If you need a refill on your cardiac medications before your next appointment, please call your pharmacy*  Lab Work: None ordered today. If you have labs (blood work) drawn today and your tests are completely normal, you will receive your results only by: MyChart Message (if you have MyChart) OR A paper copy in the mail If you have any lab test that is abnormal or we need to change your treatment, we will call you to review the results.  Testing/Procedures: VAS US  AORTA/IVC/ILIACS and Echocardiogram to be completed at next available appointment slots (ordered at last office visit).  Follow-Up: At [facility], you and your health needs are our priority.  As part of our continuing mission to provide you with exceptional heart care, our providers are all part of one team.  This team includes your primary Cardiologist (physician) and Advanced Practice Providers or APPs (Physician Assistants and Nurse Practitioners) who all work together to provide you with the care you need, when you need it.  Your next appointment:   1 year(s)  Provider:   Madonna Large, DO

## 2023-12-10 NOTE — Progress Notes (Signed)
 Cardiology Office Note:    Date:  12/10/2023  NAME:  Keith Pratt    MRN: [id number] DOB:  October 22, 1950   PCP:  Duanne Butler DASEN, MD  Former Cardiology Providers: None Primary Cardiologist:  Madonna Large, DO, [facility] (established care 10/24/2023) Electrophysiologist:  None   Chief Complaint  Patient presents with   Follow-up    Re-evaluation of chest pain  S/p pacemaker.     History of Present Illness:    Keith Pratt is a 73 y.o. Caucasian male whose past medical history and cardiovascular risk factors includes: Hypertension, hyperlipidemia, AAA, symptomatic bradycardia status post pacemaker implant.   Patient was referred to the practice back in July 2025 for evaluation of chest pain.  However, patient's precordial pain was predominantly noncardiac and EKG nonischemic.  In fact, patient had concerns for underlying conduction disease with findings to suggest high degree AV block.  Patient was seen by EP the following day and underwent pacemaker implant for symptomatic bradycardia 2-1 AV block.  Patient presents today for follow-up.  With regards to his other chronic cardiovascular comorbidities patient was recommended to undergo ultrasound of the abdomen to reevaluate his AAA and also echocardiogram to evaluate for structural heart disease.  However both of these are still pending.  Clinically denies anginal chest pain or heart failure symptoms.  His symptoms of feeling tired, fatigued, lightheaded, dizziness all have resolved.  Current Medications: Current Meds  Medication Sig   ALPRAZolam  (XANAX ) 0.5 MG tablet TAKE 1 TABLET (0.5 MG TOTAL) BY MOUTH 3 (THREE) TIMES DAILY AS NEEDED FOR ANXIETY.   amLODipine  (NORVASC ) 5 MG tablet Take 1 tablet (5 mg total) by mouth daily. (Patient taking differently: Take 10 mg by mouth daily.)   carvedilol  (COREG ) 12.5 MG tablet Take 1 tablet (12.5 mg total) by mouth 2 (two) times daily with a meal.   hydrALAZINE  (APRESOLINE ) 10 MG tablet  Take 1 tablet (10 mg total) by mouth in the morning and at bedtime. HOLD if systolic blood pressure (top number) is less than 140.   hydrochlorothiazide  (HYDRODIURIL ) 25 MG tablet Take 1 tablet (25 mg total) by mouth daily.   losartan  (COZAAR ) 100 MG tablet TAKE 1 TABLET BY MOUTH EVERY DAY   omeprazole  (PRILOSEC) 40 MG capsule Take 1 capsule (40 mg total) by mouth daily.     Allergies:    Amlodipine , Cardura  [doxazosin  mesylate], and Codeine   Past Medical History: Past Medical History:  Diagnosis Date   AAA (abdominal aortic aneurysm) (HCC)    3.5 cm   Anxiety    Arthritis    Cancer (HCC)    basal cell CA-under left arm   Cataract    Phreesia 06/29/2020   COPD (chronic obstructive pulmonary disease) (HCC)    Hyperlipidemia    Hypertension    Shingles    July 2014   Thyroid  disease    as a child- no problem as adult   Ulcer     Past Surgical History: Past Surgical History:  Procedure Laterality Date   CATARACT EXTRACTION     right eye   COLONOSCOPY     EYE SURGERY N/A    Phreesia 06/29/2020   JOINT REPLACEMENT N/A    Phreesia 06/29/2020   PACEMAKER IMPLANT N/A 10/25/2023   Procedure: PACEMAKER IMPLANT;  Surgeon: Waddell Danelle ORN, MD;  [facility];  Service: Cardiovascular;  Laterality: N/A;   right arm surgery     TONSILLECTOMY      Social History:  Social History   Tobacco Use   Smoking status: Former    Current packs/day: 0.00    Types: Cigarettes    Quit date: 01/08/2003    Years since quitting: 20.9   Smokeless tobacco: Never  Substance Use Topics   Alcohol use: No   Drug use: No    Family History: Family History  Problem Relation Age of Onset   Colon polyps Mother    Colon cancer Neg Hx    Esophageal cancer Neg Hx    Rectal cancer Neg Hx    Stomach cancer Neg Hx     ROS:   Review of Systems  Cardiovascular:  Negative for chest pain, claudication, irregular heartbeat, leg swelling, near-syncope, orthopnea, palpitations,  paroxysmal nocturnal dyspnea and syncope.  Respiratory:  Negative for shortness of breath.   Hematologic/Lymphatic: Negative for bleeding problem.    EKGs/Labs/Other Studies Reviewed:    Echocardiogram: 06/05/2018  1. The left ventricle has normal systolic function, with an ejection  fraction of 55-60%. The cavity size was normal. There is mildly increased  left ventricular wall thickness. Left ventricular diastolic Doppler  parameters are consistent with impaired  relaxation No evidence of left ventricular regional wall motion  abnormalities.   2. The right ventricle has normal systolic function. The cavity was  normal. There is no increase in right ventricular wall thickness.   3. The mitral valve is normal in structure. Mild calcification of the  mitral valve leaflet. There is mild mitral annular calcification present.  No evidence of mitral valve stenosis. No regurgitation.   4. The tricuspid valve is normal in structure.   5. The aortic valve is tricuspid Mild calcification of the aortic valve.  no stenosis of the aortic valve.   6. The aortic root and ascending aorta are normal in size and structure.   7. The IVC was not visualized. Peak RV-RA gradient 21 mmHg.   Stress Testing:  GXT 05/2016: The patient walked for 3:57 of a standard Bruce protocol GXT. Peak HR was 129 which is 84% predicted maximal HR. There were no ST changes at maximal exercise ( which was submaximal ) . He had PVCs during the GXT The BP response was normal Nondiagnositic GXT due to inability to achieve target HR . There were no ST or T wave changes at his maximal effort.  US  Aorta Medicare Screen 06/02/2016 Suprarenal fusiform AAA in the proximal aorta measuring 3. 5 x 3. 5 cm. Normal caliber, upper limit, right common iliac artery. Normal caliber left common and bilateral external iliac arteries. Aorto- iliac atherosclerosis. > 50% common iliac arteries, bilaterally. Patent IVC.  F/ U 1  year.  Labs:    Latest Ref Rng & Units 12/07/2023    4:17 PM 10/25/2023   11:47 AM 10/25/2023   11:46 AM  CBC  WBC 3.8 - 10.8 Thousand/uL 7.2   8.5   Hemoglobin 13.2 - 17.1 g/dL 87.5  86.0  85.3   Hematocrit 38.5 - 50.0 % 36.3  41.0  39.9   Platelets 140 - 400 Thousand/uL 240   243        Latest Ref Rng & Units 12/07/2023    4:17 PM 10/25/2023   11:47 AM 06/21/2023    6:01 PM  BMP  Glucose 65 - 99 mg/dL 92  895  870   BUN 7 - 25 mg/dL 16  26  17    Creatinine 0.70 - 1.28 mg/dL 8.50  8.19  8.60   BUN/Creat Ratio 6 -  22 (calc) 11     Sodium 135 - 146 mmol/L 137  136  137   Potassium 3.5 - 5.3 mmol/L 3.6  3.1  2.8   Chloride 98 - 110 mmol/L 95  95  94   CO2 20 - 32 mmol/L 33   30   Calcium 8.6 - 10.3 mg/dL 89.9   9.3       Latest Ref Rng & Units 12/07/2023    4:17 PM 10/25/2023   11:47 AM 06/21/2023    6:01 PM  CMP  Glucose 65 - 99 mg/dL 92  895  870   BUN 7 - 25 mg/dL 16  26  17    Creatinine 0.70 - 1.28 mg/dL 8.50  8.19  8.60   Sodium 135 - 146 mmol/L 137  136  137   Potassium 3.5 - 5.3 mmol/L 3.6  3.1  2.8   Chloride 98 - 110 mmol/L 95  95  94   CO2 20 - 32 mmol/L 33   30   Calcium 8.6 - 10.3 mg/dL 89.9   9.3   Total Protein 6.1 - 8.1 g/dL 7.0   7.3   Total Bilirubin 0.2 - 1.2 mg/dL 0.6   1.1   Alkaline Phos 38 - 126 U/L   48   AST 10 - 35 U/L 27   35   ALT 9 - 46 U/L 31   46     Lab Results  Component Value Date   CHOL 157 01/26/2023   HDL 32 (L) 01/26/2023   LDLCALC 86 01/26/2023   TRIG 325 (H) 01/26/2023   CHOLHDL 4.9 01/26/2023   No results for input(s): LIPOA in the last 8760 hours. No components found for: NTPROBNP No results for input(s): PROBNP in the last 8760 hours. Recent Labs    06/21/23 1801  TSH 5.099*    Physical Exam:    Today's Vitals   12/10/23 1321  BP: (!) 148/88  Pulse: 74  Resp: 16  SpO2: 94%  Weight: 219 lb 9.6 oz (99.6 kg)  Height: 6' 2 (1.88 m)   Body mass index is 28.19 kg/m. Wt Readings from Last 3 Encounters:   12/10/23 219 lb 9.6 oz (99.6 kg)  12/07/23 218 lb (98.9 kg)  10/25/23 217 lb 1.6 oz (98.5 kg)    Physical Exam  Constitutional: No distress. He appears chronically ill.  hemodynamically stable  Neck: No JVD present.  Cardiovascular: S1 normal and S2 normal. An irregularly irregular rhythm present. Bradycardia present. Exam reveals no gallop, no S3 and no S4.  No murmur heard. Pulses:      Radial pulses are 2+ on the right side and 2+ on the left side.       Dorsalis pedis pulses are 2+ on the right side and 2+ on the left side.       Posterior tibial pulses are 2+ on the right side and 2+ on the left side.  Pulmonary/Chest: Effort normal and breath sounds normal. No stridor. He has no wheezes. He has no rales.  Musculoskeletal:        General: No edema.     Cervical back: Neck supple.  Skin: Skin is warm.   Impression & Recommendation(s):  Impression:   ICD-10-CM   1. Precordial pain  R07.2     2. Conduction disorder of the heart  I45.9     3. Sick sinus syndrome (HCC)  I49.5     4. Cardiac pacemaker in situ  Z95.0  5. Benign hypertension  I10     6. Abdominal aortic aneurysm (AAA) without rupture, unspecified part (HCC)  I71.40     7. Mixed hyperlipidemia  E78.2     8. Former smoker  Z87.891        Recommendation(s):  Precordial pain No recurrence of precordial pain after pacemaker implant. Reemphasize importance of secondary prevention. Echocardiogram is still pending. As long as echocardiogram is favorable we will hold off on ischemic workup as long as he is asymptomatic.  Conduction disorder of the heart Sick sinus syndrome [facility]) Cardiac pacemaker in situ Status post pacemaker implant. Follows with device clinic.  Benign hypertension Office blood pressures are acceptable but not at goal. Patient was started on hydralazine  10 mg p.o. twice daily, states that he never picked it up but is unsure. His primary care provider increased amlodipine  to 10 mg  p.o. daily, last week Will defer blood pressure management to PCP for now. However, for accuracy of records I have advised him to call us  back to reconcile his medications.  Abdominal aortic aneurysm (AAA) without rupture, unspecified part [facility]) Had a AAA screening back in February 2018 which noteda 3.5 x 3.5 cm aneurysm and recommended 1 year follow-up.  However, no other follow-up ultrasounds have been performed.  I did order an ultrasound at the last office visit but this is still pending.  Will have it rescheduled  Mixed hyperlipidemia Lipids as of October 2024 noted in [facility] database reports LDL at 86 mg/dL and triglycerides of 674 mg/dL. Recommended that he follows up with PCP for repeat fasting lipids and to initiate medical therapy if clinically warranted.  Former smoker Re emphasized importance of complete smoking cessation.  Discussed management of at least 2 chronic comorbid conditions. Prescription drug management. Labs from 12/07/2023 independently reviewed. Pacemaker implant report 10/25/2023 independently reviewed. Recommended calling us  back with regards to an updated prescription list and to follow-up with PCP with regards to blood pressure and lipid management going forward. Patient is also advised to have his echo and vascular studies performed, we will reschedule them and call him sooner if additional workup is warranted.  Orders Placed:  No orders of the defined types were placed in this encounter.    Final Medication List:    No orders of the defined types were placed in this encounter.   Medications Discontinued During This Encounter  Medication Reason   aspirin 81 MG EC tablet Patient Preference   potassium chloride  SA (KLOR-CON  M) 20 MEQ tablet Patient Preference   tadalafil  (CIALIS ) 20 MG tablet Patient Preference     Current Outpatient Medications:    ALPRAZolam  (XANAX ) 0.5 MG tablet, TAKE 1 TABLET (0.5 MG TOTAL) BY MOUTH 3 (THREE) TIMES DAILY AS NEEDED  FOR ANXIETY., Disp: 30 tablet, Rfl: 3   amLODipine  (NORVASC ) 5 MG tablet, Take 1 tablet (5 mg total) by mouth daily. (Patient taking differently: Take 10 mg by mouth daily.), Disp: 90 tablet, Rfl: 3   carvedilol  (COREG ) 12.5 MG tablet, Take 1 tablet (12.5 mg total) by mouth 2 (two) times daily with a meal., Disp: 60 tablet, Rfl: 6   hydrALAZINE  (APRESOLINE ) 10 MG tablet, Take 1 tablet (10 mg total) by mouth in the morning and at bedtime. HOLD if systolic blood pressure (top number) is less than 140., Disp: 30 tablet, Rfl: 3   hydrochlorothiazide  (HYDRODIURIL ) 25 MG tablet, Take 1 tablet (25 mg total) by mouth daily., Disp: 90 tablet, Rfl: 3   losartan  (COZAAR ) 100 MG tablet,  TAKE 1 TABLET BY MOUTH EVERY DAY, Disp: 90 tablet, Rfl: 1   omeprazole  (PRILOSEC) 40 MG capsule, Take 1 capsule (40 mg total) by mouth daily., Disp: 90 capsule, Rfl: 3  Consent:   N/A  Disposition:   1 year follow-up sooner if needed  His questions and concerns were addressed to his satisfaction. He voices understanding of the recommendations provided during this encounter.    Signed, Madonna Michele HAS, [facility]  A Division of [facility] [address], [place], [place] [address]  12/10/2023 1:36 PM

## 2023-12-11 ENCOUNTER — Ambulatory Visit (INDEPENDENT_AMBULATORY_CARE_PROVIDER_SITE_OTHER)

## 2023-12-11 DIAGNOSIS — I495 Sick sinus syndrome: Secondary | ICD-10-CM | POA: Diagnosis not present

## 2023-12-12 ENCOUNTER — Telehealth: Payer: Self-pay

## 2023-12-12 LAB — CUP PACEART REMOTE DEVICE CHECK
Battery Remaining Longevity: 124 mo
Battery Remaining Percentage: 95.5 %
Battery Voltage: 3.02 V
Brady Statistic AP VP Percent: 33 %
Brady Statistic AP VS Percent: 1 %
Brady Statistic AS VP Percent: 66 %
Brady Statistic AS VS Percent: 1 %
Brady Statistic RA Percent Paced: 32 %
Brady Statistic RV Percent Paced: 99 %
Date Time Interrogation Session: 20250826020013
Implantable Lead Connection Status: 753985
Implantable Lead Connection Status: 753985
Implantable Lead Implant Date: 20250710
Implantable Lead Implant Date: 20250710
Implantable Lead Location: 753859
Implantable Lead Location: 753860
Implantable Pulse Generator Implant Date: 20250710
Lead Channel Impedance Value: 480 Ohm
Lead Channel Impedance Value: 480 Ohm
Lead Channel Pacing Threshold Amplitude: 0.5 V
Lead Channel Pacing Threshold Amplitude: 0.75 V
Lead Channel Pacing Threshold Pulse Width: 0.5 ms
Lead Channel Pacing Threshold Pulse Width: 0.5 ms
Lead Channel Sensing Intrinsic Amplitude: 12 mV
Lead Channel Sensing Intrinsic Amplitude: 5 mV
Lead Channel Setting Pacing Amplitude: 1 V
Lead Channel Setting Pacing Amplitude: 1.5 V
Lead Channel Setting Pacing Pulse Width: 0.5 ms
Lead Channel Setting Sensing Sensitivity: 2 mV
Pulse Gen Model: 2272
Pulse Gen Serial Number: 8276234

## 2023-12-12 MED ORDER — HYDRALAZINE HCL 25 MG PO TABS: 25.0000 mg | ORAL_TABLET | Freq: Two times a day (BID) | ORAL | 3 refills | Status: DC

## 2023-12-12 NOTE — Telephone Encounter (Signed)
 Updated Med list, [facility] to patient to advise Dr. Michele advises to increase hydralazine  to 25 mg BID.

## 2023-12-12 NOTE — Telephone Encounter (Signed)
 Please update his MAR.  Can increase hydralazine  from 10mg  po bid to 25mg  po bid until he follows up w/ PCP for BP management.   Kenlei Safi [place], DO, FACC

## 2023-12-13 ENCOUNTER — Ambulatory Visit: Payer: Self-pay | Admitting: Internal Medicine

## 2023-12-14 ENCOUNTER — Other Ambulatory Visit: Payer: Self-pay | Admitting: Cardiology

## 2023-12-14 DIAGNOSIS — I10 Essential (primary) hypertension: Secondary | ICD-10-CM

## 2023-12-18 ENCOUNTER — Telehealth: Payer: Self-pay

## 2023-12-18 ENCOUNTER — Telehealth: Payer: Self-pay | Admitting: Family Medicine

## 2023-12-18 ENCOUNTER — Other Ambulatory Visit: Payer: Self-pay

## 2023-12-18 DIAGNOSIS — I10 Essential (primary) hypertension: Secondary | ICD-10-CM

## 2023-12-18 MED ORDER — LOSARTAN POTASSIUM 100 MG PO TABS: 100.0000 mg | ORAL_TABLET | Freq: Every day | ORAL | 1 refills | Status: AC

## 2023-12-18 NOTE — Telephone Encounter (Signed)
 Copied from CRM [id number]. Topic: Clinical - Medication Question >> Dec 18, 2023 12:53 PM Delon T wrote: Reason for CRM: Patient forgot which medication to stop when the blood pressure goes down- please call to advise [phone]

## 2023-12-18 NOTE — Telephone Encounter (Signed)
 Prescription Request  12/18/2023  LOV: 12/07/2023  What is the name of the medication or equipment? losartan  (COZAAR ) 100 MG tablet   Have you contacted your pharmacy to request a refill? Yes   Which pharmacy would you like this sent to?  [id number] [address] [place], [place] - [address] [place] [address] Phone: [phone] Fax: [phone]    Patient notified that their request is being sent to the clinical staff for review and that they should receive a response within 2 business days.   Please advise at [facility] [phone]

## 2023-12-21 ENCOUNTER — Telehealth: Payer: Self-pay

## 2023-12-21 ENCOUNTER — Other Ambulatory Visit: Payer: Self-pay

## 2023-12-21 NOTE — Telephone Encounter (Signed)
 Copied from CRM [id number]. Topic: General - Other >> Dec 21, 2023  2:41 PM Sophia H wrote: Reason for CRM: Patient is returning missed phone call from Ronal Bradley - please reach back out to patient. # [phone]

## 2023-12-31 ENCOUNTER — Encounter (HOSPITAL_COMMUNITY)

## 2023-12-31 NOTE — Progress Notes (Signed)
 Remote PPM Transmission

## 2024-01-08 ENCOUNTER — Ambulatory Visit (HOSPITAL_COMMUNITY)
Admission: RE | Admit: 2024-01-08 | Discharge: 2024-01-08 | Disposition: A | Discharge status: Home / Self Care | Source: Ambulatory Visit | Attending: Cardiology | Admitting: Cardiology

## 2024-01-08 ENCOUNTER — Ambulatory Visit: Payer: Self-pay | Admitting: Cardiology

## 2024-01-08 DIAGNOSIS — I459 Conduction disorder, unspecified: Secondary | ICD-10-CM | POA: Insufficient documentation

## 2024-01-08 DIAGNOSIS — I714 Abdominal aortic aneurysm, without rupture, unspecified: Secondary | ICD-10-CM | POA: Diagnosis not present

## 2024-01-08 DIAGNOSIS — R072 Precordial pain: Secondary | ICD-10-CM | POA: Insufficient documentation

## 2024-01-08 DIAGNOSIS — R0609 Other forms of dyspnea: Secondary | ICD-10-CM

## 2024-01-08 DIAGNOSIS — R600 Localized edema: Secondary | ICD-10-CM

## 2024-01-08 LAB — ECHOCARDIOGRAM COMPLETE
AR max vel: 2.19 cm2
AV Area VTI: 2.91 cm2
AV Area mean vel: 2.19 cm2
AV Mean grad: 2 mmHg
AV Peak grad: 4.8 mmHg
Ao pk vel: 1.1 m/s
Area-P 1/2: 2.55 cm2
S' Lateral: 3.06 cm

## 2024-01-09 NOTE — Progress Notes (Signed)
 MyChart message containing providers result note and interpretation read by patient on: Last read by Garnette VEAR Lang Marcey at 11:05AM on 01/09/2024.

## 2024-01-10 ENCOUNTER — Other Ambulatory Visit

## 2024-01-10 DIAGNOSIS — I160 Hypertensive urgency: Secondary | ICD-10-CM

## 2024-01-10 DIAGNOSIS — I10 Essential (primary) hypertension: Secondary | ICD-10-CM | POA: Diagnosis not present

## 2024-01-10 LAB — RENAL FUNCTION PANEL
Albumin: 4.2 g/dL (ref 3.6–5.1)
BUN/Creatinine Ratio: 11 (calc) (ref 6–22)
BUN: 16 mg/dL (ref 7–25)
CO2: 32 mmol/L (ref 20–32)
Calcium: 9.1 mg/dL (ref 8.6–10.3)
Chloride: 98 mmol/L (ref 98–110)
Creat: 1.45 mg/dL — ABNORMAL HIGH (ref 0.70–1.28)
Glucose, Bld: 114 mg/dL — ABNORMAL HIGH (ref 65–99)
Phosphorus: 2.4 mg/dL (ref 2.1–4.3)
Potassium: 3 mmol/L — ABNORMAL LOW (ref 3.5–5.3)
Sodium: 138 mmol/L (ref 135–146)

## 2024-01-10 LAB — BASIC METABOLIC PANEL WITH GFR
BUN/Creatinine Ratio: 11 (calc) (ref 6–22)
BUN: 16 mg/dL (ref 7–25)
CO2: 32 mmol/L (ref 20–32)
Calcium: 9.1 mg/dL (ref 8.6–10.3)
Chloride: 98 mmol/L (ref 98–110)
Creat: 1.45 mg/dL — ABNORMAL HIGH (ref 0.70–1.28)
Glucose, Bld: 114 mg/dL — ABNORMAL HIGH (ref 65–99)
Potassium: 3 mmol/L — ABNORMAL LOW (ref 3.5–5.3)
Sodium: 138 mmol/L (ref 135–146)
eGFR: 50 mL/min/1.73m2 — ABNORMAL LOW (ref >=60)

## 2024-01-11 ENCOUNTER — Other Ambulatory Visit: Payer: Self-pay

## 2024-01-11 ENCOUNTER — Ambulatory Visit: Payer: Self-pay | Admitting: Family Medicine

## 2024-01-11 MED ORDER — POTASSIUM CHLORIDE CRYS ER 20 MEQ PO TBCR: 20.0000 meq | EXTENDED_RELEASE_TABLET | Freq: Every day | ORAL | 3 refills | Status: DC

## 2024-01-20 ENCOUNTER — Other Ambulatory Visit: Payer: Self-pay | Admitting: Family Medicine

## 2024-01-20 DIAGNOSIS — M5441 Lumbago with sciatica, right side: Secondary | ICD-10-CM

## 2024-01-29 ENCOUNTER — Ambulatory Visit: Payer: PPO | Admitting: Family Medicine

## 2024-01-29 VITALS — BP 142/82 | HR 76 | Temp 98.7°F | Ht 74.0 in | Wt 215.4 lb

## 2024-01-29 DIAGNOSIS — I10 Essential (primary) hypertension: Secondary | ICD-10-CM

## 2024-01-29 DIAGNOSIS — R0789 Other chest pain: Secondary | ICD-10-CM

## 2024-01-29 MED ORDER — ALPRAZOLAM 0.5 MG PO TABS: 0.5000 mg | ORAL_TABLET | Freq: Three times a day (TID) | ORAL | 3 refills | Status: AC | PRN

## 2024-01-29 MED ORDER — AMLODIPINE BESYLATE 10 MG PO TABS: 10.0000 mg | ORAL_TABLET | Freq: Every day | ORAL | 1 refills | Status: DC

## 2024-01-29 MED ORDER — OMEPRAZOLE 40 MG PO CPDR
40.0000 mg | DELAYED_RELEASE_CAPSULE | Freq: Every day | ORAL | 3 refills | Status: AC
Start: 2024-01-29 — End: ?

## 2024-01-29 NOTE — Progress Notes (Signed)
 Subjective:    Patient ID: Keith Pratt, male    DOB: Nov 23, 1950, 73 y.o.   MRN: [id number]  HPI  07/05/23 Patient has a history of hypertension.  Recently at his physical exam LDL cholesterol was found to be in the 80s.  He has no known heart disease.  Recently he had to go to the emergency room due to extremely high blood pressure and atypical chest pain.  In the emergency room, troponins were negative x 2.  Chest x-ray was normal.  However patient's blood pressure was greater than 200 systolic.  At discharge, his blood pressure was 159 systolic.  Today his blood pressure remains elevated despite taking atenolol , losartan , and hydrochlorothiazide .  The patient a history of swelling on amlodipine  and dizziness with doxazosin . He reports intermittent chest pain.  The pain is located near the xiphoid process.  He states that some mild chest pain that comes at random times last for a few minutes and then resolves.  It seems to be unrelated to activity.  However he also reports increasing dyspnea on exertion over the last 4 weeks.  He is getting more easily winded.  At that time, my plan was:  Given the patient will chest pain and worsening dyspnea on exertion, I feel he needs to see cardiology to rule out underlying coronary artery disease.  I have placed a referral to cardiology hopefully we can expedite that as quick as possible  01/29/24 Patient was originally scheduled for physical exam today.  However he continues to report intermittent chest pain.  He states the pain occurs almost on a daily basis below the xiphoid process.  It can last for a few seconds to a minute and then it subsides.  He saw cardiology in July.  No stress test,.  They felt it most likely precordial pain and benign.  However he had a pacemaker due to AV block.  Echocardiogram was normal other than diastolic dysfunction.  He is concerned because his brother died from heart attack earlier this year.  He denies any acid reflux.   He denies any melena or hematochezia.  He denies any pain in the right upper quadrant.  Patient's blood pressure today is acceptable 142/82 however he has not been taking his amlodipine . Past Medical History:  Diagnosis Date   AAA (abdominal aortic aneurysm)    3.5 cm   Anxiety    Arthritis    Cancer (HCC)    basal cell CA-under left arm   Cataract    Phreesia 06/29/2020   COPD (chronic obstructive pulmonary disease) (HCC)    Hyperlipidemia    Hypertension    Shingles    July 2014   Thyroid  disease    as a child- no problem as adult   Ulcer    Past Surgical History:  Procedure Laterality Date   CATARACT EXTRACTION     right eye   COLONOSCOPY     EYE SURGERY N/A    Phreesia 06/29/2020   JOINT REPLACEMENT N/A    Phreesia 06/29/2020   PACEMAKER IMPLANT N/A 10/25/2023   Procedure: PACEMAKER IMPLANT;  Surgeon: Waddell Danelle ORN, MD;  Location: [facility];  Service: Cardiovascular;  Laterality: N/A;   right arm surgery     TONSILLECTOMY     Current Outpatient Medications on File Prior to Visit  Medication Sig Dispense Refill   carvedilol  (COREG ) 12.5 MG tablet Take 1 tablet (12.5 mg total) by mouth 2 (two) times daily with a meal. 60  tablet 6   hydrALAZINE  (APRESOLINE ) 25 MG tablet Take 1 tablet (25 mg total) by mouth in the morning and at bedtime. 180 tablet 3   hydrochlorothiazide  (HYDRODIURIL ) 25 MG tablet Take 1 tablet (25 mg total) by mouth daily. 90 tablet 3   losartan  (COZAAR ) 100 MG tablet Take 1 tablet (100 mg total) by mouth daily. 90 tablet 1   potassium chloride  SA (KLOR-CON  M) 20 MEQ tablet Take 1 tablet (20 mEq total) by mouth daily. 30 tablet 3   tadalafil  (CIALIS ) 20 MG tablet Take 20 mg by mouth daily as needed.     No current facility-administered medications on file prior to visit.   Allergies  Allergen Reactions   Cardura  [Doxazosin  Mesylate] Other (See Comments)    Dizzy, hot all over - per pt jusr felt sick   Codeine     REACTION: nauses and  vomiting   Social History   Socioeconomic History   Marital status: Divorced    Spouse name: Not on file   Number of children: Not on file   Years of education: Not on file   Highest education level: Associate degree: occupational, Scientist, product/process development, or vocational program  Occupational History   Not on file  Tobacco Use   Smoking status: Former    Current packs/day: 0.00    Types: Cigarettes    Quit date: 01/08/2003    Years since quitting: 21.0   Smokeless tobacco: Never  Substance and Sexual Activity   Alcohol use: No   Drug use: No   Sexual activity: Yes  Other Topics Concern   Not on file  Social History Narrative   Not on file   Social Drivers of Health   Financial Resource Strain: Low Risk  (01/29/2024)   Overall Financial Resource Strain (CARDIA)    Difficulty of Paying Living Expenses: Not very hard  Food Insecurity: No Food Insecurity (01/29/2024)   Hunger Vital Sign    Worried About Running Out of Food in the Last Year: Never true    Ran Out of Food in the Last Year: Never true  Transportation Needs: No Transportation Needs (01/29/2024)   PRAPARE - Administrator, Civil Service (Medical): No    Lack of Transportation (Non-Medical): No  Physical Activity: Sufficiently Active (01/29/2024)   Exercise Vital Sign    Days of Exercise per Week: 5 days    Minutes of Exercise per Session: 30 min  Stress: Stress Concern Present (01/29/2024)   [employer] of Occupational Health - Occupational Stress Questionnaire    Feeling of Stress: To some extent  Social Connections: Socially Isolated (01/29/2024)   Social Connection and Isolation Panel    Frequency of Communication with Friends and Family: More than three times a week    Frequency of Social Gatherings with Friends and Family: Twice a week    Attends Religious Services: Never    Database administrator or Organizations: No    Attends Engineer, structural: Not on file    Marital Status: Divorced   Intimate Partner Violence: Not At Risk (10/25/2023)   Humiliation, Afraid, Rape, and Kick questionnaire    Fear of Current or Ex-Partner: No    Emotionally Abused: No    Physically Abused: No    Sexually Abused: No      Review of Systems  All other systems reviewed and are negative.      Objective:   Physical Exam Vitals reviewed.  Constitutional:      General:  He is not in acute distress.    Appearance: Normal appearance. He is well-developed and normal weight. He is not diaphoretic.  HENT:     Head: Normocephalic and atraumatic.  Neck:     Thyroid : No thyromegaly.     Vascular: No carotid bruit or JVD.     Trachea: No tracheal deviation.  Cardiovascular:     Rate and Rhythm: Normal rate and regular rhythm.     Pulses: Normal pulses.     Heart sounds: Normal heart sounds. No murmur heard.    No friction rub. No gallop.  Pulmonary:     Effort: Pulmonary effort is normal. No respiratory distress.     Breath sounds: Normal breath sounds. No stridor. No wheezing or rales.  Chest:     Chest wall: No tenderness.  Abdominal:     General: Bowel sounds are normal. There is no distension.     Palpations: Abdomen is soft. There is no mass.     Tenderness: There is no abdominal tenderness. There is no guarding or rebound.  Musculoskeletal:     Cervical back: Neck supple. No rigidity.     Right lower leg: No edema.     Left lower leg: No edema.  Lymphadenopathy:     Cervical: No cervical adenopathy.  Skin:    General: Skin is warm.     Coloration: Skin is not pale.     Findings: No erythema or rash.  Neurological:     Mental Status: He is alert and oriented to person, place, and time.     Cranial Nerves: No cranial nerve deficit.     Motor: No abnormal muscle tone.     Coordination: Coordination normal.     Deep Tendon Reflexes: Reflexes are normal and symmetric.  Psychiatric:        Behavior: Behavior normal.        Thought Content: Thought content normal.         Judgment: Judgment normal.           Assessment & Plan:  Primary hypertension - Plan: amLODipine  (NORVASC ) 10 MG tablet  Atypical chest pain - Plan: amLODipine  (NORVASC ) 10 MG tablet, ALPRAZolam  (XANAX ) 0.5 MG tablet, CT CARDIAC SCORING (SELF PAY ONLY), omeprazole  (PRILOSEC) 40 MG capsule, CBC with Differential/Platelet, Comprehensive metabolic panel with GFR  We spent the entire visit today almost 30 minutes discussing his chest pain.  I believe is most likely musculoskeletal although I cannot rule out potential acid reflux or gas pains.  I recommended he resume omeprazole  40 mg a day to see if the pain improves after doing this.  Also recommended a coronary artery calcium score to help risk stratify the patient.  If significantly elevated, we may want to refer the patient back to cardiology for stress test.  Meanwhile check CMP to monitor his potassium to determine if he can discontinue his potassium supplement.  Monitor CBC for any anemia or leukocytosis.  Refilled patient's Xanax  today.

## 2024-01-30 LAB — CBC WITH DIFFERENTIAL/PLATELET
Absolute Lymphocytes: 2046 {cells}/uL (ref 850–3900)
Absolute Monocytes: 539 {cells}/uL (ref 200–950)
Basophils Absolute: 93 {cells}/uL (ref 0–200)
Basophils Relative: 1.5 %
Eosinophils Absolute: 428 {cells}/uL (ref 15–500)
Eosinophils Relative: 6.9 %
HCT: 38.3 % — ABNORMAL LOW (ref 38.5–50.0)
Hemoglobin: 12.9 g/dL — ABNORMAL LOW (ref 13.2–17.1)
MCH: 29.9 pg (ref 27.0–33.0)
MCHC: 33.7 g/dL (ref 32.0–36.0)
MCV: 88.9 fL (ref 80.0–100.0)
MPV: 9.7 fL (ref 7.5–12.5)
Monocytes Relative: 8.7 %
Neutro Abs: 3094 {cells}/uL (ref 1500–7800)
Neutrophils Relative %: 49.9 %
Platelets: 250 Thousand/uL (ref 140–400)
RBC: 4.31 Million/uL (ref 4.20–5.80)
RDW: 12.6 % (ref 11.0–15.0)
Total Lymphocyte: 33 %
WBC: 6.2 Thousand/uL (ref 3.8–10.8)

## 2024-01-30 LAB — COMPREHENSIVE METABOLIC PANEL WITH GFR
AG Ratio: 1.7 (calc) (ref 1.0–2.5)
ALT: 19 U/L (ref 9–46)
AST: 19 U/L (ref 10–35)
Albumin: 4.3 g/dL (ref 3.6–5.1)
Alkaline phosphatase (APISO): 58 U/L (ref 35–144)
BUN/Creatinine Ratio: 9 (calc) (ref 6–22)
BUN: 12 mg/dL (ref 7–25)
CO2: 28 mmol/L (ref 20–32)
Calcium: 9.3 mg/dL (ref 8.6–10.3)
Chloride: 104 mmol/L (ref 98–110)
Creat: 1.33 mg/dL — ABNORMAL HIGH (ref 0.70–1.28)
Globulin: 2.6 g/dL (ref 1.9–3.7)
Glucose, Bld: 97 mg/dL (ref 65–99)
Potassium: 4.2 mmol/L (ref 3.5–5.3)
Sodium: 139 mmol/L (ref 135–146)
Total Bilirubin: 0.5 mg/dL (ref 0.2–1.2)
Total Protein: 6.9 g/dL (ref 6.1–8.1)
eGFR: 56 mL/min/1.73m2 — ABNORMAL LOW

## 2024-01-31 ENCOUNTER — Ambulatory Visit: Payer: Self-pay | Admitting: Family Medicine

## 2024-01-31 DIAGNOSIS — R911 Solitary pulmonary nodule: Secondary | ICD-10-CM

## 2024-02-05 ENCOUNTER — Encounter: Admitting: Cardiology

## 2024-02-07 ENCOUNTER — Encounter: Payer: Self-pay | Admitting: Cardiology

## 2024-02-07 ENCOUNTER — Ambulatory Visit: Attending: Cardiology | Admitting: Cardiology

## 2024-02-07 VITALS — BP 158/79 | HR 75 | Ht 74.0 in | Wt 214.0 lb

## 2024-02-07 DIAGNOSIS — I441 Atrioventricular block, second degree: Secondary | ICD-10-CM

## 2024-02-07 DIAGNOSIS — I4719 Other supraventricular tachycardia: Secondary | ICD-10-CM

## 2024-02-07 DIAGNOSIS — I495 Sick sinus syndrome: Secondary | ICD-10-CM

## 2024-02-07 DIAGNOSIS — I442 Atrioventricular block, complete: Secondary | ICD-10-CM | POA: Diagnosis not present

## 2024-02-07 DIAGNOSIS — R072 Precordial pain: Secondary | ICD-10-CM

## 2024-02-07 DIAGNOSIS — Z950 Presence of cardiac pacemaker: Secondary | ICD-10-CM

## 2024-02-07 LAB — CUP PACEART INCLINIC DEVICE CHECK
Date Time Interrogation Session: 20251023162744
Implantable Lead Connection Status: 753985
Implantable Lead Connection Status: 753985
Implantable Lead Implant Date: 20250710
Implantable Lead Implant Date: 20250710
Implantable Lead Location: 753859
Implantable Lead Location: 753860
Implantable Pulse Generator Implant Date: 20250710
Lead Channel Impedance Value: 430 Ohm
Lead Channel Impedance Value: 480 Ohm
Lead Channel Pacing Threshold Amplitude: 0.5 V
Lead Channel Pacing Threshold Amplitude: 0.75 V
Lead Channel Pacing Threshold Pulse Width: 0.5 ms
Lead Channel Pacing Threshold Pulse Width: 0.5 ms
Lead Channel Sensing Intrinsic Amplitude: 5 mV
Pulse Gen Model: 2272
Pulse Gen Serial Number: 8276234

## 2024-02-07 NOTE — Progress Notes (Unsigned)
 Electrophysiology Office Note:   Date:  02/08/2024  ID:  Keith Pratt, DOB 1950/11/07, MRN [id number]  Primary Cardiologist: Madonna Large, DO Electrophysiologist: Fonda Kitty, MD      History of Present Illness:   Keith Pratt is a 73 y.o. male with h/o hypertension, hyperlipidemia, AAA, symptomatic bradycardia status post pacemaker implant who is being seen today for post implant follow up.  Discussed the use of AI scribe software for clinical note transcription with the patient, who gave verbal consent to proceed.  History of Present Illness Keith Pratt is a 73 year old male who presents for follow-up after pacemaker implantation. He was referred by Dr. Large for pacemaker implantation due to complete heart block.  He underwent pacemaker implantation after being diagnosed with complete heart block. Prior to the procedure, he experienced low energy levels. Since the pacemaker was placed, he has had a slight increase in energy levels.  Post-surgery, he experiences occasional sharp, shooting pain between his stomach and heart, which occurs unexpectedly and is not related to the pacemaker site. The pain is described as sharp, but the cause remains unclear.  He has difficulty sleeping, attributing it to anxiety and 'white coat syndrome', which causes his blood pressure to rise during medical visits. He does not sleep much and believes anxiety is a significant factor.  He noticed some swelling in his lower legs and tops of his feet after a shower, which he had not experienced before. However, there is no progressive swelling or discomfort at the time of the visit.   Review of systems complete and found to be negative unless listed in HPI.   EP Information / Studies Reviewed:    EKG is ordered today. Personal review as below.  EKG Interpretation Date/Time:  Thursday February 07 2024 14:56:19 EDT Ventricular Rate:  75 PR Interval:  210 QRS Duration:  118 QT  Interval:  390 QTC Calculation: 435 R Axis:   -57  Text Interpretation: AV dual-paced rhythm with prolonged AV conduction When compared with ECG of 26-Oct-2023 04:44, Vent. rate has increased BY  14 BPM Confirmed by Kitty Fonda [phone]) on 02/07/2024 3:24:11 PM   Echo 01/08/24:   1. Left ventricular ejection fraction, by estimation, is 60 to 65%. The  left ventricle has normal function. The left ventricle has no regional  wall motion abnormalities. There is mild concentric left ventricular  hypertrophy. Left ventricular diastolic  parameters are consistent with Grade I diastolic dysfunction (impaired  relaxation). Elevated left atrial pressure.   2. Right ventricular systolic function is normal. The right ventricular  size is normal.   3. The mitral valve is normal in structure. No evidence of mitral valve  regurgitation. No evidence of mitral stenosis.   4. The aortic valve is tricuspid. Aortic valve regurgitation is mild.  Aortic valve sclerosis is present, with no evidence of aortic valve  stenosis.   5. The inferior vena cava is normal in size with greater than 50%  respiratory variability, suggesting right atrial pressure of 3 mmHg.    Physical Exam:   VS:  BP (!) 158/79   Pulse 75   Ht 6' 2 (1.88 m)   Wt 214 lb (97.1 kg)   SpO2 97%   BMI 27.48 kg/m    Wt Readings from Last 3 Encounters:  02/07/24 214 lb (97.1 kg)  01/29/24 215 lb 6.4 oz (97.7 kg)  12/10/23 219 lb 9.6 oz (99.6 kg)     GEN: Well nourished, well developed  in no acute distress NECK: No JVD CARDIAC: Normal rate, regular rhythm.  Well-healed left chest pacer pocket. RESPIRATORY:  Clear to auscultation without rales, wheezing or rhonchi  ABDOMEN: Soft, non-distended EXTREMITIES:  No edema; No deformity   ASSESSMENT AND PLAN:    # Complete heart block status post dual-chamber pacemaker: # Sinus node dysfunction -In clinic device interrogation was performed today.  Appropriate device function and  stable lead parameters.  Battery status okay. -Continue remote monitoring.  # Atrial tachycardia: 11 AMS episodes noted on device check, longest 10 seconds. - Continue carvedilol  12.5 mg twice daily.  Follow up with EP APP in 12 months  Signed, Fonda Kitty, MD

## 2024-02-07 NOTE — Patient Instructions (Signed)

## 2024-02-09 ENCOUNTER — Ambulatory Visit: Payer: Self-pay | Admitting: Cardiology

## 2024-02-12 ENCOUNTER — Ambulatory Visit: Payer: Self-pay

## 2024-02-12 NOTE — Telephone Encounter (Signed)
  Reason for Disposition . Triager needs further essential information from caller in order to complete triage  Answer Assessment - Initial Assessment Questions 1. REASON FOR CALL: What is the main reason for your call? or How can I best help you?     Pt states his friend has a PCP and he is concerned about her weight loss. Advised that this RN cannot discuss patient's concerns d/t patient privacy. He was provided with the main [facility] # [phone] for her to call to speak with a nurse directly. Advised that she may also go to the emergency department at any time if she would prefer to be evaluated there for any medical concerns.  Protocols used: Information Only Call - No Triage-A-AH Copied from CRM [id number]. Topic: Clinical - Medical Advice >> Feb 12, 2024 12:37 PM Larissa S wrote: Reason for CRM: Patient has a question regarding a friend that is losing weight. States friend has seen multiple doctors and they can not figure out what is going on with her and would like a callback from nurse/PCP to see what other options are available for his friend.

## 2024-02-22 ENCOUNTER — Ambulatory Visit: Payer: Self-pay | Admitting: *Deleted

## 2024-02-22 NOTE — Telephone Encounter (Signed)
 FYI Only or Action Required?: FYI only for provider: appointment scheduled on 02/25/24 and medication questions regarding amlodipine  .  Patient was last seen in primary care on 01/29/2024 by Duanne Butler DASEN, MD.  Called Nurse Triage reporting Foot Swelling.  Symptoms began today.  Interventions attempted: Rest, hydration, or home remedies.  Symptoms are: gradually worsening.  Triage Disposition: See PCP When Office is Open (Within 3 Days)  Patient/caregiver understands and will follow disposition?: yes    Please advise if patient needs to be seen sooner due to hx of recent pacemaker. See NT encounter.           Copied from CRM [id number]. Topic: Clinical - Red Word Triage >> Feb 22, 2024 10:25 AM Emylou G wrote: [facility] that prompted transfer to Nurse Triage: Swelling feet and ankles currently ( skin is really tight on his feet - aching ) .SABRA New pacemaker a few weeks ago - he started new drugs due to the surgery hydrALAZINE  (APRESOLINE ) 25 MG tablet and carvedilol  (COREG ) 12.5 MG tablet.  Unsure if it is the meds and which one.  He did stop amLODipine  (NORVASC ) 10 MG tablet a few months ago - then they put him back on. Reason for Disposition  MODERATE ankle swelling (e.g., interferes with normal activities, can't move joint normally) (Exceptions: Itchy, localized swelling; swelling is chronic.)  Answer Assessment - Initial Assessment Questions Appt scheduled for 02/25/24. Patient concerned about taking medication amlodipine  and swelling. Requesting if patient can check BP and patient reports he does not like using that machine. Did not check BP. Denies fever, no chest pain no difficulty breathing. No redness reported to skin at feet and ankles. Did report skin tight and top of feet sensation million bee stings. Recommended if sx worsen go to ED. Patient requesting call back regarding .        1. LOCATION: Which ankle is swollen? Where is the swelling?     Bilateral  feet and ankle swelling and skin tight left side worse  2. ONSET: When did the swelling start?     Worsening x couple of weeks but noted more this am  3. SWELLING: How bad is the swelling? Or, How large is it? (e.g., mild, moderate, severe; size of localized swelling)      Moderate  4. PAIN: Is there any pain? If Yes, ask: How bad is it? (Scale 0-10; or none, mild, moderate, severe)     Achey and skin tight discomfort to walk and million bee stings  5. CAUSE: What do you think caused the ankle swelling?     Possible medication  6. OTHER SYMPTOMS: Do you have any other symptoms? (e.g., fever, chest pain, difficulty breathing, calf pain)     No chest pain, no difficulty breathing no fever, no redness reported in legs. No itching to skin. Indention noted around legs calf area from socks.  7. PREGNANCY: Is there any chance you are pregnant? When was your last menstrual period?     na  Protocols used: Ankle Swelling-A-AH

## 2024-02-25 ENCOUNTER — Encounter: Payer: Self-pay | Admitting: Family Medicine

## 2024-02-25 ENCOUNTER — Ambulatory Visit: Admitting: Family Medicine

## 2024-02-25 VITALS — BP 146/92 | HR 81 | Temp 97.5°F | Ht 74.0 in | Wt 214.0 lb

## 2024-02-25 DIAGNOSIS — I10 Essential (primary) hypertension: Secondary | ICD-10-CM | POA: Diagnosis not present

## 2024-02-25 MED ORDER — HYDRALAZINE HCL 50 MG PO TABS: 50.0000 mg | ORAL_TABLET | Freq: Two times a day (BID) | ORAL | 3 refills | Status: AC

## 2024-02-25 NOTE — Progress Notes (Signed)
 Subjective:    Patient ID: Keith Pratt, male    DOB: [birth year], 73 y.o.   MRN: [id number]  HPI  Patient recently resumed his amlodipine .  His blood pressure is 146/92 however he has developed pitting edema in both legs distal to the mid shin.  He denies any chest pain or shortness of breath.  He denies any orthopnea or paroxysmal nocturnal dyspnea. Wt Readings from Last 3 Encounters:  02/25/24 214 lb (97.1 kg)  02/07/24 214 lb (97.1 kg)  01/29/24 215 lb 6.4 oz (97.7 kg)   Echocardiogram earlier this year showed an ejection fraction of 65%. Past Medical History:  Diagnosis Date   AAA (abdominal aortic aneurysm)    3.5 cm   Anxiety    Arthritis    Cancer (HCC)    basal cell CA-under left arm   Cataract    [employer]reesia 06/29/2020   COPD (chronic obstructive pulmonary disease) (HCC)    Hyperlipidemia    Hypertension    Shingles    July 2014   Thyroid  disease    as a child- no problem as adult   Ulcer    Past Surgical History:  Procedure Laterality Date   CATARACT EXTRACTION     right eye   COLONOSCOPY     EYE SURGERY N/A    [employer]reesia 06/29/2020   JOINT REPLACEMENT N/A    [employer]reesia 06/29/2020   PACEMAKER IMPLANT N/A 10/25/2023   Procedure: PACEMAKER IMPLANT;  Surgeon: Waddell Danelle ORN, MD;  Location: [facility];  Service: Cardiovascular;  Laterality: N/A;   right arm surgery     TONSILLECTOMY     Current Outpatient Medications on File Prior to Visit  Medication Sig Dispense Refill   ALPRAZolam  (XANAX ) 0.5 MG tablet Take 1 tablet (0.5 mg total) by mouth 3 (three) times daily as needed for anxiety. 30 tablet 3   carvedilol  (COREG ) 12.5 MG tablet Take 1 tablet (12.5 mg total) by mouth 2 (two) times daily with a meal. 60 tablet 6   hydrochlorothiazide  (HYDRODIURIL ) 25 MG tablet Take 1 tablet (25 mg total) by mouth daily. 90 tablet 3   losartan  (COZAAR ) 100 MG tablet Take 1 tablet (100 mg total) by mouth daily. 90 tablet 1   potassium chloride  SA (KLOR-CON  M) 20 MEQ  tablet Take 1 tablet (20 mEq total) by mouth daily. 30 tablet 3   tadalafil  (CIALIS ) 20 MG tablet Take 20 mg by mouth daily as needed.     omeprazole  (PRILOSEC) 40 MG capsule Take 1 capsule (40 mg total) by mouth daily. (Patient not taking: Reported on 02/25/2024) 90 capsule 3   No current facility-administered medications on file prior to visit.   Allergies  Allergen Reactions   Cardura  [Doxazosin  Mesylate] Other (See Comments)    Dizzy, hot all over - per pt jusr felt sick   Codeine     REACTION: nauses and vomiting   Social History   Socioeconomic History   Marital status: Divorced    Spouse name: Not on file   Number of children: Not on file   Years of education: Not on file   Highest education level: Associate degree: occupational, scientist, product/process development, or vocational program  Occupational History   Not on file  Tobacco Use   Smoking status: Former    Current packs/day: 0.00    Types: Cigarettes    Quit date: 01/08/2003    Years since quitting: 21.1   Smokeless tobacco: Never  Substance and Sexual Activity   Alcohol  use: No   Drug use: No   Sexual activity: Yes  Other Topics Concern   Not on file  Social History Narrative   Not on file   Social Drivers of Health   Financial Resource Strain: Low Risk  (01/29/2024)   Overall Financial Resource Strain (CARDIA)    Difficulty of Paying Living Expenses: Not very hard  Food Insecurity: No Food Insecurity (01/29/2024)   Hunger Vital Sign    Worried About Running Out of Food in the Last Year: Never true    Ran Out of Food in the Last Year: Never true  Transportation Needs: No Transportation Needs (01/29/2024)   PRAPARE - Administrator, Civil Service (Medical): No    Lack of Transportation (Non-Medical): No  Physical Activity: Sufficiently Active (01/29/2024)   Exercise Vital Sign    Days of Exercise per Week: 5 days    Minutes of Exercise per Session: 30 min  Stress: Stress Concern Present (01/29/2024)   [employer] - Occupational Stress Questionnaire    Feeling of Stress: To some extent  Social Connections: Socially Isolated (01/29/2024)   Social Connection and Isolation Panel    Frequency of Communication with Friends and Family: More than three times a week    Frequency of Social Gatherings with Friends and Family: Twice a week    Attends Religious Services: Never    Database Administrator or Organizations: No    Attends Engineer, Structural: Not on file    Marital Status: Divorced  Intimate Partner Violence: Not At Risk (10/25/2023)   Humiliation, Afraid, Rape, and Kick questionnaire    Fear of Current or Ex-Partner: No    Emotionally Abused: No    Physically Abused: No    Sexually Abused: No      Review of Systems  All other systems reviewed and are negative.      Objective:   Physical Exam Vitals reviewed.  Constitutional:      General: He is not in acute distress.    Appearance: Normal appearance. He is well-developed and normal weight. He is not diaphoretic.  HENT:     Head: Normocephalic and atraumatic.  Neck:     Thyroid : No thyromegaly.     Vascular: No carotid bruit or JVD.     Trachea: No tracheal deviation.  Cardiovascular:     Rate and Rhythm: Normal rate and regular rhythm.     Pulses: Normal pulses.     Heart sounds: Normal heart sounds. No murmur heard.    No friction rub. No gallop.  Pulmonary:     Effort: Pulmonary effort is normal. No respiratory distress.     Breath sounds: Normal breath sounds. No stridor. No wheezing or rales.  Chest:     Chest wall: No tenderness.  Abdominal:     General: Bowel sounds are normal. There is no distension.     Palpations: Abdomen is soft. There is no mass.     Tenderness: There is no abdominal tenderness. There is no guarding or rebound.  Musculoskeletal:     Cervical back: Neck supple. No rigidity.     Right lower leg: No edema.     Left lower leg: No edema.  Lymphadenopathy:      Cervical: No cervical adenopathy.  Skin:    General: Skin is warm.     Coloration: Skin is not pale.     Findings: No erythema or rash.  Neurological:  Mental Status: He is alert and oriented to person, place, and time.     Cranial Nerves: No cranial nerve deficit.     Motor: No abnormal muscle tone.     Coordination: Coordination normal.     Deep Tendon Reflexes: Reflexes are normal and symmetric.  Psychiatric:        Behavior: Behavior normal.        Thought Content: Thought content normal.        Judgment: Judgment normal.           Assessment & Plan:  Benign essential HTN Patient has some mild edema in both feet.  He does not want to continue the amlodipine  because of the swelling.  Therefore we will discontinue the amlodipine  to try to help the edema and replace by increasing hydralazine  to 50 mg twice daily.  Recheck blood pressure in 2 weeks.  Consider spironolactone if blood pressure is recalcitrant

## 2024-03-11 ENCOUNTER — Ambulatory Visit

## 2024-03-11 DIAGNOSIS — I495 Sick sinus syndrome: Secondary | ICD-10-CM

## 2024-03-12 ENCOUNTER — Ambulatory Visit (HOSPITAL_COMMUNITY)
Admission: RE | Admit: 2024-03-12 | Discharge: 2024-03-12 | Disposition: A | Discharge status: Home / Self Care | Payer: Self-pay | Source: Ambulatory Visit | Attending: Family Medicine | Admitting: Family Medicine

## 2024-03-12 DIAGNOSIS — R0789 Other chest pain: Secondary | ICD-10-CM | POA: Insufficient documentation

## 2024-03-12 LAB — CUP PACEART REMOTE DEVICE CHECK
Battery Remaining Longevity: 115 mo
Battery Remaining Percentage: 95.5 %
Battery Voltage: 3.01 V
Brady Statistic AP VP Percent: 32 %
Brady Statistic AP VS Percent: 1 %
Brady Statistic AS VP Percent: 67 %
Brady Statistic AS VS Percent: 1 %
Brady Statistic RA Percent Paced: 31 %
Brady Statistic RV Percent Paced: 99 %
Date Time Interrogation Session: 20251125020015
Implantable Lead Connection Status: 753985
Implantable Lead Connection Status: 753985
Implantable Lead Implant Date: 20250710
Implantable Lead Implant Date: 20250710
Implantable Lead Location: 753859
Implantable Lead Location: 753860
Implantable Pulse Generator Implant Date: 20250710
Lead Channel Impedance Value: 430 Ohm
Lead Channel Impedance Value: 510 Ohm
Lead Channel Pacing Threshold Amplitude: 0.375 V
Lead Channel Pacing Threshold Amplitude: 0.875 V
Lead Channel Pacing Threshold Pulse Width: 0.5 ms
Lead Channel Pacing Threshold Pulse Width: 0.5 ms
Lead Channel Sensing Intrinsic Amplitude: 12 mV
Lead Channel Sensing Intrinsic Amplitude: 5 mV
Lead Channel Setting Pacing Amplitude: 1.125
Lead Channel Setting Pacing Amplitude: 1.375
Lead Channel Setting Pacing Pulse Width: 0.5 ms
Lead Channel Setting Sensing Sensitivity: 4 mV
Pulse Gen Model: 2272
Pulse Gen Serial Number: 8276234

## 2024-03-14 NOTE — Progress Notes (Signed)
 Remote PPM Transmission

## 2024-03-17 ENCOUNTER — Ambulatory Visit: Payer: Self-pay | Admitting: Cardiology

## 2024-03-20 ENCOUNTER — Telehealth: Payer: Self-pay | Admitting: Family Medicine

## 2024-03-20 NOTE — Telephone Encounter (Signed)
 Copied from CRM #[id number]. Topic: General - Call Back - No Documentation >> Mar 20, 2024 10:31 AM Olam RAMAN wrote: Reason for CRM:  pt was calling to make sure an appt is scheduled for a ct. advised 03/27/2024  1:40 PM at [facility] [facility] Pt is asking why provider ordered CT for to and would like a cb [phone]

## 2024-03-27 ENCOUNTER — Inpatient Hospital Stay: Admission: RE | Admit: 2024-03-27 | Discharge: 2024-03-27 | Attending: Family Medicine | Admitting: Family Medicine

## 2024-03-27 DIAGNOSIS — R911 Solitary pulmonary nodule: Secondary | ICD-10-CM

## 2024-04-04 ENCOUNTER — Encounter: Payer: Self-pay | Admitting: Family Medicine

## 2024-04-04 ENCOUNTER — Other Ambulatory Visit: Payer: Self-pay

## 2024-04-04 ENCOUNTER — Ambulatory Visit: Payer: Self-pay | Admitting: Family Medicine

## 2024-04-04 DIAGNOSIS — I712 Thoracic aortic aneurysm, without rupture, unspecified: Secondary | ICD-10-CM | POA: Insufficient documentation

## 2024-04-04 DIAGNOSIS — R918 Other nonspecific abnormal finding of lung field: Secondary | ICD-10-CM

## 2024-04-04 DIAGNOSIS — R911 Solitary pulmonary nodule: Secondary | ICD-10-CM | POA: Insufficient documentation

## 2024-04-24 NOTE — Progress Notes (Signed)
 LEANTHONY RHETT                                          MRN: [id number]   04/24/2024   The [facility] Quality Team Specialist reviewed this patient medical record for the purposes of chart review for care gap closure. The following were reviewed: chart review for care gap closure-controlling blood pressure.    [facility]BCI Quality Team

## 2024-04-27 ENCOUNTER — Other Ambulatory Visit: Payer: Self-pay | Admitting: Family Medicine

## 2024-05-16 ENCOUNTER — Emergency Department (HOSPITAL_BASED_OUTPATIENT_CLINIC_OR_DEPARTMENT_OTHER): Admitting: Radiology

## 2024-05-16 ENCOUNTER — Other Ambulatory Visit: Payer: Self-pay

## 2024-05-16 ENCOUNTER — Emergency Department (HOSPITAL_BASED_OUTPATIENT_CLINIC_OR_DEPARTMENT_OTHER)
Admission: EM | Admit: 2024-05-16 | Discharge: 2024-05-16 | Disposition: A | Discharge status: Home / Self Care | Source: Ambulatory Visit | Attending: Emergency Medicine | Admitting: Emergency Medicine

## 2024-05-16 ENCOUNTER — Encounter (HOSPITAL_BASED_OUTPATIENT_CLINIC_OR_DEPARTMENT_OTHER): Payer: Self-pay | Admitting: Emergency Medicine

## 2024-05-16 DIAGNOSIS — Z79899 Other long term (current) drug therapy: Secondary | ICD-10-CM | POA: Diagnosis not present

## 2024-05-16 DIAGNOSIS — R079 Chest pain, unspecified: Secondary | ICD-10-CM | POA: Diagnosis present

## 2024-05-16 DIAGNOSIS — I10 Essential (primary) hypertension: Secondary | ICD-10-CM | POA: Diagnosis not present

## 2024-05-16 DIAGNOSIS — Z950 Presence of cardiac pacemaker: Secondary | ICD-10-CM | POA: Insufficient documentation

## 2024-05-16 DIAGNOSIS — R030 Elevated blood-pressure reading, without diagnosis of hypertension: Secondary | ICD-10-CM | POA: Diagnosis not present

## 2024-05-16 LAB — BASIC METABOLIC PANEL WITH GFR
Anion gap: 12 (ref 5–15)
BUN: 14 mg/dL (ref 8–23)
CO2: 27 mmol/L (ref 22–32)
Calcium: 9.6 mg/dL (ref 8.9–10.3)
Chloride: 96 mmol/L — ABNORMAL LOW (ref 98–111)
Creatinine, Ser: 1.19 mg/dL (ref 0.61–1.24)
GFR, Estimated: >60 mL/min
Glucose, Bld: 130 mg/dL — ABNORMAL HIGH (ref 70–99)
Potassium: 3 mmol/L — ABNORMAL LOW (ref 3.5–5.1)
Sodium: 134 mmol/L — ABNORMAL LOW (ref 135–145)

## 2024-05-16 LAB — CBC
HCT: 39.1 % (ref 39.0–52.0)
Hemoglobin: 14.2 g/dL (ref 13.0–17.0)
MCH: 30 pg (ref 26.0–34.0)
MCHC: 36.3 g/dL — ABNORMAL HIGH (ref 30.0–36.0)
MCV: 82.7 fL (ref 80.0–100.0)
Platelets: 230 10*3/uL (ref 150–400)
RBC: 4.73 MIL/uL (ref 4.22–5.81)
RDW: 11.9 % (ref 11.5–15.5)
WBC: 7.7 10*3/uL (ref 4.0–10.5)
nRBC: 0 % (ref 0.0–0.2)

## 2024-05-16 LAB — TROPONIN T, HIGH SENSITIVITY
Troponin T High Sensitivity: 9 ng/L (ref 0–19)
Troponin T High Sensitivity: 8 ng/L (ref 0–19)

## 2024-05-16 LAB — MAGNESIUM: Magnesium: 2 mg/dL (ref 1.7–2.4)

## 2024-05-16 MED ORDER — POTASSIUM CHLORIDE CRYS ER 20 MEQ PO TBCR
40.0000 meq | EXTENDED_RELEASE_TABLET | Freq: Once | ORAL | Status: AC
  Administered 2024-05-16: 40 meq via ORAL
  Filled 2024-05-16: qty 2

## 2024-05-16 NOTE — ED Triage Notes (Signed)
 Chest pain x couple days  Sharp intermittent pain across chest Tightness Denies worsening sob, no n/v

## 2024-05-16 NOTE — ED Provider Notes (Signed)
 " [place] EMERGENCY DEPARTMENT AT [facility] Provider Note   CSN: [id number] Arrival date & time: 05/16/24  1209     Patient presents with: Chest Pain   Keith Pratt is a 74 y.o. male.  With past medical history of hypertension, hyperlipidemia, Mobitz 2, thoracic aneurysm, pacemaker presents to emergency room with complaint of chest pain.  Patient reports that he is having a intermittent sharp and shooting chest pain from 1 shoulder to the other that lasts a few seconds.  He tells me he has been having some pain around his pacemaker but this is not new.  He denies any cough, fever, shortness of breath or swelling in feet and ankles. He sees cardiology regularly.  He reports he just recently stopped his amlodipine  due to side effect of bilateral lower extremity swelling.    Chest Pain      Prior to Admission medications  Medication Sig Start Date End Date Taking? Authorizing Provider  amLODipine  (NORVASC ) 10 MG tablet Take 10 mg by mouth daily. 04/25/24  Yes [provider]  hydrALAZINE  (APRESOLINE ) 25 MG tablet  03/08/24  Yes [provider]  ALPRAZolam  (XANAX ) 0.5 MG tablet Take 1 tablet (0.5 mg total) by mouth 3 (three) times daily as needed for anxiety. 01/29/24   Duanne Butler DASEN, MD  carvedilol  (COREG ) 12.5 MG tablet Take 1 tablet (12.5 mg total) by mouth 2 (two) times daily with a meal. 10/26/23   Tillery, Ozell Barter, PA-C  hydrALAZINE  (APRESOLINE ) 50 MG tablet Take 1 tablet (50 mg total) by mouth in the morning and at bedtime. 02/25/24   Duanne Butler DASEN, MD  hydrochlorothiazide  (HYDRODIURIL ) 25 MG tablet Take 1 tablet (25 mg total) by mouth daily. 09/20/23   Duanne Butler DASEN, MD  losartan  (COZAAR ) 100 MG tablet Take 1 tablet (100 mg total) by mouth daily. 12/18/23   Duanne Butler DASEN, MD  omeprazole  (PRILOSEC) 40 MG capsule Take 1 capsule (40 mg total) by mouth daily. Patient not taking: Reported on 02/25/2024 01/29/24   Duanne Butler DASEN, MD   potassium chloride  SA (KLOR-CON  M) 20 MEQ tablet TAKE 1 TABLET BY MOUTH EVERY DAY 04/28/24   Duanne Butler DASEN, MD  tadalafil  (CIALIS ) 20 MG tablet Take 20 mg by mouth daily as needed. 12/14/23   [provider]    Allergies: Cardura  [doxazosin  mesylate] and Codeine    Review of Systems  Cardiovascular:  Positive for chest pain.    Updated Vital Signs BP (!) 183/105   Pulse 60   Temp 98.3 F (36.8 C) (Oral)   Resp 10   SpO2 98%   Physical Exam Vitals and nursing note reviewed.  Constitutional:      General: He is not in acute distress.    Appearance: He is not toxic-appearing.  HENT:     Head: Normocephalic and atraumatic.  Eyes:     General: No scleral icterus.    Conjunctiva/sclera: Conjunctivae normal.  Cardiovascular:     Rate and Rhythm: Normal rate and regular rhythm.     Pulses: Normal pulses.     Heart sounds: Normal heart sounds.  Pulmonary:     Effort: Pulmonary effort is normal. No respiratory distress.     Breath sounds: Normal breath sounds.  Chest:     Chest wall: No tenderness.  Abdominal:     General: Abdomen is flat. Bowel sounds are normal.     Palpations: Abdomen is soft.     Tenderness: There is no abdominal tenderness.  Musculoskeletal:     Right lower leg: No edema.     Left lower leg: No edema.  Skin:    General: Skin is warm and dry.     Findings: No lesion.  Neurological:     General: No focal deficit present.     Mental Status: He is alert and oriented to person, place, and time. Mental status is at baseline.     (all labs ordered are listed, but only abnormal results are displayed) Labs Reviewed  BASIC METABOLIC PANEL WITH GFR - Abnormal; Notable for the following components:      Result Value   Sodium 134 (*)    Potassium 3.0 (*)    Chloride 96 (*)    Glucose, Bld 130 (*)    All other components within normal limits  CBC - Abnormal; Notable for the following components:   MCHC 36.3 (*)    All other components within  normal limits  MAGNESIUM   TROPONIN T, HIGH SENSITIVITY  TROPONIN T, HIGH SENSITIVITY    EKG: EKG Interpretation Date/Time:  Friday May 16 2024 12:51:07 EST Ventricular Rate:  63 PR Interval:  169 QRS Duration:  136 QT Interval:  449 QTC Calculation: 460 R Axis:   -65  Text Interpretation: Sinus rhythm RBBB and LAFB No significant change since last tracing Confirmed by Emil Share [phone]) on 05/16/2024 2:49:48 PM  Radiology: DG Chest 2 View Result Date: 05/16/2024 CLINICAL DATA:  Chest pain EXAM: CHEST - 2 VIEW COMPARISON:  10/26/2023 FINDINGS: Frontal and lateral views of the chest demonstrate an unremarkable cardiac silhouette. Dual lead pacer overlies left chest, stable. No acute airspace disease, effusion, or pneumothorax. No acute bony abnormalities. IMPRESSION: 1. No acute intrathoracic process. Electronically Signed   By: Ozell Daring M.D.   On: 05/16/2024 14:26     Procedures   Medications Ordered in the ED  potassium chloride  SA (KLOR-CON  M) CR tablet 40 mEq (40 mEq Oral Given 05/16/24 1349)                                    Medical Decision Making Amount and/or Complexity of Data Reviewed Labs: ordered. Radiology: ordered.  Risk Prescription drug management.   This patient presents to the ED for concern of chest pain, this involves an extensive number of treatment options, and is a complaint that carries with it a high risk of complications and morbidity.  The differential diagnosis includes ACS, PE, CHF, aortic dissection, pneumonia, pneumothorax   Co morbidities that complicate the patient evaluation  Hypertension, hyperlipidemia, pacemaker   Additional history obtained:  Additional history obtained from  Seen on 02/07/24 and seems to complain of similar symptoms of sharp and shooting pain since getting the pacemaker for sick sinus syndrome    Lab Tests:  I personally interpreted labs.  The pertinent results include:   CBC is unremarkable.   Troponin x 2 within normal limits K 3.0 supplemented orally, Magnesium  within normal limits.    Imaging Studies ordered:  I ordered imaging studies including x-ray I independently visualized and interpreted imaging which showed no acute findings I agree with the radiologist interpretation   Cardiac Monitoring: / EKG:  The patient was maintained on a cardiac monitor.  I personally viewed and interpreted the cardiac monitored which showed an underlying rhythm of: sinsus Pacemakers was interrelated and normal function, no arrhythmia.    Problem List / ED Course / Critical  interventions / Medication management  Patient comes in complaining of chest pain.  He reports that he has been having intermittent shooting sensation over his chest and to his shoulder.  He reports this last several seconds and it does not radiate.  He denies any exertional chest pain or exertional shortness of breath.  He has not had any lower extremity swelling.  He reports that he has seen cardiology for similar symptoms in the past and questions if it is related to pacemaker.   Hemodynamically stable and well-appearing.  EKG does not show arrhythmia.  His pacemaker was interrogated which show normal function. Troponin times two is negative and patient denies extertional symptoms or extertional chest pain, thus doubt ACS at this time.  Doubt aortic dissection as patient's pulses are equal bilaterally and symptoms have not been persistent.  Patient has no history of PE/DVT and has no lower extremity swelling or calf tenderness.  No recent surgeries and no recent travel this doubt PE as cause of symptoms.  No evidence of chest x-ray or pneumothorax on chest x-ray.  I ordered medication including Potassium  I have reviewed the patients home medicines and have made adjustments as needed.  On reassessment patient feels he has been in ED for extensive period of time and he is really wanting to get home, he is requesting  discharge. Cardiology referral placed for outpatient follow up. Given return precaution.         Final diagnoses:  Chest pain, unspecified type  Elevated blood pressure reading    ED Discharge Orders          Ordered    Ambulatory referral to Cardiology       Comments: If you have not heard from the Cardiology office within the next 72 hours please call [phone].   05/16/24 1643               Gaynell Eggleton, Warren SAILOR, PA-C 05/16/24 1702  "

## 2024-05-16 NOTE — ED Notes (Signed)
 Received call from [facility]. Jude regarding pacemaker interrogation. Per their evaluation patient is in NSR. No pacemaker problems were found. Battery life is good. Information passed along to PA.

## 2024-05-16 NOTE — Discharge Instructions (Addendum)
 Your labs and EKG are reassuring today.  Please follow-up with your cardiologist for recheck of blood pressure and today's chest pain.  Return to the emergency room for new or worsening symptoms.

## 2024-05-23 ENCOUNTER — Other Ambulatory Visit: Payer: Self-pay | Admitting: Student

## 2024-06-10 ENCOUNTER — Encounter

## 2024-08-14 ENCOUNTER — Encounter

## 2024-09-09 ENCOUNTER — Encounter

## 2024-12-09 ENCOUNTER — Encounter
